# Patient Record
Sex: Female | Born: 1935 | Race: White | Hispanic: No | Marital: Married | Smoking: Never smoker
Health system: Southern US, Community
[De-identification: ages and names within clinical notes are randomized; demographics above are authoritative.]

## PROBLEM LIST (undated history)

## (undated) DIAGNOSIS — C50911 Malignant neoplasm of unspecified site of right female breast: Principal | ICD-10-CM

## (undated) DIAGNOSIS — C801 Malignant (primary) neoplasm, unspecified: Secondary | ICD-10-CM

## (undated) DIAGNOSIS — E119 Type 2 diabetes mellitus without complications: Secondary | ICD-10-CM

## (undated) DIAGNOSIS — L02412 Cutaneous abscess of left axilla: Secondary | ICD-10-CM

## (undated) DIAGNOSIS — I10 Essential (primary) hypertension: Secondary | ICD-10-CM

## (undated) DIAGNOSIS — Z923 Personal history of irradiation: Secondary | ICD-10-CM

## (undated) DIAGNOSIS — K219 Gastro-esophageal reflux disease without esophagitis: Secondary | ICD-10-CM

## (undated) DIAGNOSIS — N321 Vesicointestinal fistula: Secondary | ICD-10-CM

## (undated) DIAGNOSIS — I739 Peripheral vascular disease, unspecified: Secondary | ICD-10-CM

## (undated) DIAGNOSIS — D649 Anemia, unspecified: Secondary | ICD-10-CM

## (undated) DIAGNOSIS — F329 Major depressive disorder, single episode, unspecified: Secondary | ICD-10-CM

## (undated) DIAGNOSIS — F419 Anxiety disorder, unspecified: Secondary | ICD-10-CM

## (undated) DIAGNOSIS — M1990 Unspecified osteoarthritis, unspecified site: Secondary | ICD-10-CM

## (undated) DIAGNOSIS — F32A Depression, unspecified: Secondary | ICD-10-CM

## (undated) DIAGNOSIS — C50919 Malignant neoplasm of unspecified site of unspecified female breast: Secondary | ICD-10-CM

## (undated) HISTORY — DX: Anemia, unspecified: D64.9

## (undated) HISTORY — DX: Type 2 diabetes mellitus without complications: E11.9

## (undated) HISTORY — DX: Malignant neoplasm of unspecified site of right female breast: C50.911

## (undated) HISTORY — PX: TONSILLECTOMY: SUR1361

## (undated) HISTORY — PX: TUBAL LIGATION: SHX77

## (undated) HISTORY — DX: Gastro-esophageal reflux disease without esophagitis: K21.9

## (undated) HISTORY — PX: ILEOSTOMY: SHX1783

## (undated) HISTORY — PX: SALPINGOOPHORECTOMY: SHX82

## (undated) HISTORY — DX: Unspecified osteoarthritis, unspecified site: M19.90

## (undated) HISTORY — DX: Malignant (primary) neoplasm, unspecified: C80.1

---

## 1964-11-27 HISTORY — PX: OVARY SURGERY: SHX727

## 1964-11-27 HISTORY — PX: APPENDECTOMY: SHX54

## 2012-11-27 DIAGNOSIS — Z923 Personal history of irradiation: Secondary | ICD-10-CM

## 2012-11-27 DIAGNOSIS — C50919 Malignant neoplasm of unspecified site of unspecified female breast: Secondary | ICD-10-CM

## 2012-11-27 HISTORY — DX: Malignant neoplasm of unspecified site of unspecified female breast: C50.919

## 2012-11-27 HISTORY — PX: BREAST LUMPECTOMY: SHX2

## 2012-11-27 HISTORY — DX: Personal history of irradiation: Z92.3

## 2013-06-30 ENCOUNTER — Ambulatory Visit (INDEPENDENT_AMBULATORY_CARE_PROVIDER_SITE_OTHER): Payer: Self-pay | Admitting: Surgery

## 2013-07-02 ENCOUNTER — Ambulatory Visit (INDEPENDENT_AMBULATORY_CARE_PROVIDER_SITE_OTHER): Payer: Self-pay | Admitting: Surgery

## 2013-07-04 ENCOUNTER — Encounter (INDEPENDENT_AMBULATORY_CARE_PROVIDER_SITE_OTHER): Payer: Self-pay | Admitting: Surgery

## 2013-07-04 ENCOUNTER — Ambulatory Visit (INDEPENDENT_AMBULATORY_CARE_PROVIDER_SITE_OTHER): Payer: Medicare Other | Admitting: Surgery

## 2013-07-04 VITALS — BP 158/90 | HR 64 | Temp 98.3°F | Resp 14 | Ht 65.0 in | Wt 205.8 lb

## 2013-07-04 DIAGNOSIS — C50911 Malignant neoplasm of unspecified site of right female breast: Secondary | ICD-10-CM

## 2013-07-04 DIAGNOSIS — C50919 Malignant neoplasm of unspecified site of unspecified female breast: Secondary | ICD-10-CM

## 2013-07-04 HISTORY — DX: Malignant neoplasm of unspecified site of right female breast: C50.911

## 2013-07-04 NOTE — Progress Notes (Signed)
Patient ID: Michele Palmer, female   DOB: 1936/07/23, 77 y.o.   MRN: [id number]  Chief Complaint  Patient presents with  . Breast Cancer    eval rt br mass    HPI Michele Palmer is a 77 y.o. female.  She recently had a mammogram and an abnormality was found in the right breast. A needle core biopsy has apparently shown cancer. Surgical consultation has been requested. She has no definite family history of breast cancer although there is a question that her uncle had breast cancer but he has several other types of cancer so this is not at all clear. There are no women with either breast or ovarian cancer. She is not having any breast symptoms or problems.  HPI  Past Medical History  Diagnosis Date  . Anemia   . Arthritis   . Cancer   . Diabetes mellitus without complication   . GERD (gastroesophageal reflux disease)     Past Surgical History  Procedure Laterality Date  . Ovary surgery      removed ovaries  . Salpingoophorectomy    . Appendectomy  1966    Family History  Problem Relation Age of Onset  . Stroke Mother   . Stroke Father   . Cancer Sister     ovarian    Social History History  Substance Use Topics  . Smoking status: Never Smoker   . Smokeless tobacco: Never Used  . Alcohol Use: No    Allergies  Allergen Reactions  . Codeine Anxiety    Current Outpatient Prescriptions  Medication Sig Dispense Refill  . ALPRAZolam (XANAX) 0.25 MG tablet Take 0.25 mg by mouth 2 (two) times daily.      . citalopram (CELEXA) 10 MG tablet Take 10 mg by mouth daily.      . Lactobacillus (FLORAJEN ACIDOPHILUS PO) Take by mouth.      . losartan (COZAAR) 100 MG tablet Take 100 mg by mouth daily.      . ranitidine (ZANTAC) 150 MG capsule Take 150 mg by mouth 2 (two) times daily.       No current facility-administered medications for this visit.    Review of Systems Review of Systems  Constitutional: Negative for fever, chills and unexpected weight change.  HENT:  Negative for hearing loss, congestion, sore throat, trouble swallowing and voice change.   Eyes: Negative for visual disturbance.  Respiratory: Negative for cough and wheezing.   Cardiovascular: Negative for chest pain, palpitations and leg swelling.  Gastrointestinal: Negative for nausea, vomiting, abdominal pain, diarrhea, constipation, blood in stool, abdominal distention and anal bleeding.  Genitourinary: Negative for hematuria, vaginal bleeding and difficulty urinating.  Musculoskeletal: Negative for arthralgias.  Skin: Negative for rash and wound.  Neurological: Negative for seizures, syncope and headaches.  Hematological: Negative for adenopathy. Does not bruise/bleed easily.  Psychiatric/Behavioral: Negative for confusion.    Blood pressure 158/90, pulse 64, temperature 98.3 F (36.8 C), temperature source Temporal, resp. rate 14, height 5\' 5"  (1.651 m), weight 205 lb 12.8 oz (93.35 kg).  Physical Exam Physical Exam  Vitals reviewed. Constitutional: She is oriented to person, place, and time. She appears well-developed and well-nourished. No distress.  HENT:  Head: Normocephalic and atraumatic.  Mouth/Throat: Oropharynx is clear and moist.  Eyes: Conjunctivae and EOM are normal. Pupils are equal, round, and reactive to light. No scleral icterus.  Neck: Normal range of motion. Neck supple. No tracheal deviation present. No thyromegaly present.  Cardiovascular: Normal rate, regular rhythm, normal  heart sounds and intact distal pulses.  Exam reveals no gallop and no friction rub.   No murmur heard. Pulmonary/Chest: Effort normal and breath sounds normal. No respiratory distress. She has no wheezes. She has no rales. Right breast exhibits no inverted nipple, no mass, no nipple discharge, no skin change and no tenderness. Left breast exhibits no inverted nipple, no mass, no nipple discharge, no skin change and no tenderness. Breasts are symmetrical.  Abdominal: Soft. Bowel sounds are  normal. She exhibits no distension and no mass. There is no tenderness. There is no rebound and no guarding.  Musculoskeletal: Normal range of motion. She exhibits no edema and no tenderness.  Lymphadenopathy:    She has no cervical adenopathy.    She has no axillary adenopathy.       Right: No supraclavicular adenopathy present.       Left: No supraclavicular adenopathy present.  Neurological: She is alert and oriented to person, place, and time.  Skin: Skin is warm and dry. No rash noted. She is not diaphoretic. No erythema.  Psychiatric: She has a normal mood and affect. Her behavior is normal. Judgment and thought content normal.    Data Reviewed I reviewed the mammogram and pathology reports. Pathology shows invasive lobular carcinoma receptor positive.  Assessment    Clinical stage I right breast cancer 6:00 position     Plan    I have recommended that we perform more localized lumpectomy with sln.discuss her case at the breast conference this week to confirm.However, since this is lobular, will need to get an MRI to be sure we are not missing a second lesion or that the tumor is bigger than we think.  I have explained the pathophysiology and staging of breast cancer with particular attention to her exact situation. We discussed the multidisciplinary approach to breast cancer which often includes both medical and radiation oncology consultations.  We also discussed surgical options for the treatment of breast cancer including lumpectomy and mastectomy with possible reconstructive surgery. In addition we talked about the evaluation and management of lymph nodes including a description of sentinel lymph node biopsy and axillary dissections. We reviewed potential complications and risks including bleeding, infection, numbness,  lymphedema, and the potential need for additional surgery.  She understands that for patients who are candidate for lumpectomy or mastectomy there is an equal  survival rate with either technique, but a slightly higher local recurrence rate with lumpectomy. In addition she knows that a lumpectomy usually requires postoperative radiation as part of the management of the breast cancer.  We have discussed the likely postoperative course and plans for followup.  I have given the patient some written information that reviewed all of these issues. I believe her questions are answered and that she has a good understanding of the issues. I have discussed the indications for the lumpectomy and described the procedure. She understand that the chance of removal of the abnormal area is very good, but that occasionally we are unable to locate it and may have to do a second procedure. We also discussed the possibility of a second procedure to get additional tissue. Risks of surgery such as bleeding and infection have also been explained, as well as the implications of not doing the surgery. She understands and wishes to proceed.         Kami Kube J 07/04/2013, 11:12 AM

## 2013-07-04 NOTE — Patient Instructions (Signed)
We will schedule breast MRI, and once that is done and make final plans for surgery.

## 2013-07-07 ENCOUNTER — Other Ambulatory Visit (INDEPENDENT_AMBULATORY_CARE_PROVIDER_SITE_OTHER): Payer: Self-pay | Admitting: Surgery

## 2013-07-07 ENCOUNTER — Telehealth (INDEPENDENT_AMBULATORY_CARE_PROVIDER_SITE_OTHER): Payer: Self-pay | Admitting: General Surgery

## 2013-07-07 DIAGNOSIS — C50911 Malignant neoplasm of unspecified site of right female breast: Secondary | ICD-10-CM

## 2013-07-07 NOTE — Telephone Encounter (Signed)
Spoke with pt to inform her that it may be a couple of days before I can get her scheduled for her MRI because I am still waiting on the reports to be faxed to [place] from [facility] in [place].  Informed her that I would call her as soon as I have heard something.

## 2013-07-10 ENCOUNTER — Other Ambulatory Visit (INDEPENDENT_AMBULATORY_CARE_PROVIDER_SITE_OTHER): Payer: Self-pay | Admitting: Surgery

## 2013-07-11 ENCOUNTER — Encounter (INDEPENDENT_AMBULATORY_CARE_PROVIDER_SITE_OTHER): Payer: Self-pay

## 2013-07-14 ENCOUNTER — Ambulatory Visit
Admission: RE | Admit: 2013-07-14 | Discharge: 2013-07-14 | Disposition: A | Discharge status: Home / Self Care | Payer: Medicare Other | Source: Ambulatory Visit | Attending: Surgery | Admitting: Surgery

## 2013-07-14 DIAGNOSIS — C50911 Malignant neoplasm of unspecified site of right female breast: Secondary | ICD-10-CM

## 2013-07-14 MED ORDER — GADOBENATE DIMEGLUMINE 529 MG/ML IV SOLN
19.0000 mL | Freq: Once | INTRAVENOUS | Status: AC | PRN
  Administered 2013-07-14: 19 mL via INTRAVENOUS

## 2013-07-18 ENCOUNTER — Other Ambulatory Visit (INDEPENDENT_AMBULATORY_CARE_PROVIDER_SITE_OTHER): Payer: Self-pay | Admitting: Surgery

## 2013-07-18 ENCOUNTER — Telehealth (INDEPENDENT_AMBULATORY_CARE_PROVIDER_SITE_OTHER): Payer: Self-pay | Admitting: Surgery

## 2013-07-18 DIAGNOSIS — C50911 Malignant neoplasm of unspecified site of right female breast: Secondary | ICD-10-CM

## 2013-07-18 NOTE — Telephone Encounter (Signed)
I called her today and reviewed plans. The MRI shows only the single cancer and she was presented at the Breast Conference and consensus was for lumpectomy and sln. Told her we would go ahead and make those arrangements.

## 2013-07-22 ENCOUNTER — Encounter (HOSPITAL_BASED_OUTPATIENT_CLINIC_OR_DEPARTMENT_OTHER): Payer: Self-pay | Admitting: *Deleted

## 2013-07-22 NOTE — Progress Notes (Signed)
Bring all medications. Plans to come Tuesday for CMET, CBC,Diff, U/A, EKG and CXR.

## 2013-07-29 ENCOUNTER — Other Ambulatory Visit: Payer: Self-pay

## 2013-07-29 ENCOUNTER — Ambulatory Visit
Admission: RE | Admit: 2013-07-29 | Discharge: 2013-07-29 | Disposition: A | Discharge status: Home / Self Care | Payer: Medicare Other | Source: Ambulatory Visit | Attending: Surgery | Admitting: Surgery

## 2013-07-29 ENCOUNTER — Encounter (HOSPITAL_BASED_OUTPATIENT_CLINIC_OR_DEPARTMENT_OTHER)
Admission: RE | Admit: 2013-07-29 | Discharge: 2013-07-29 | Disposition: A | Discharge status: Home / Self Care | Payer: Medicare Other | Source: Ambulatory Visit | Attending: Surgery | Admitting: Surgery

## 2013-07-29 LAB — CBC WITH DIFFERENTIAL/PLATELET
Basophils Relative: 1 % (ref 0–1)
HCT: 41 % (ref 36.0–46.0)
Hemoglobin: 13.9 g/dL (ref 12.0–15.0)
MCH: 31.2 pg (ref 26.0–34.0)
MCHC: 33.9 g/dL (ref 30.0–36.0)
Monocytes Absolute: 0.7 10*3/uL (ref 0.1–1.0)
Monocytes Relative: 9 % (ref 3–12)
Neutro Abs: 4.9 10*3/uL (ref 1.7–7.7)

## 2013-07-29 LAB — COMPREHENSIVE METABOLIC PANEL
Albumin: 3.7 g/dL (ref 3.5–5.2)
BUN: 30 mg/dL — ABNORMAL HIGH (ref 6–23)
Chloride: 103 mEq/L (ref 96–112)
Creatinine, Ser: 0.69 mg/dL (ref 0.50–1.10)
GFR calc Af Amer: >90 mL/min (ref >90)
GFR calc non Af Amer: 83 mL/min — ABNORMAL LOW (ref >90)
Total Bilirubin: 0.2 mg/dL — ABNORMAL LOW (ref 0.3–1.2)

## 2013-07-29 LAB — URINE MICROSCOPIC-ADD ON

## 2013-07-29 LAB — URINALYSIS, ROUTINE W REFLEX MICROSCOPIC
Glucose, UA: NEGATIVE mg/dL
Nitrite: NEGATIVE
Protein, ur: NEGATIVE mg/dL
Urobilinogen, UA: 0.2 mg/dL (ref 0.0–1.0)

## 2013-08-01 ENCOUNTER — Encounter (HOSPITAL_BASED_OUTPATIENT_CLINIC_OR_DEPARTMENT_OTHER): Payer: Self-pay

## 2013-08-01 ENCOUNTER — Ambulatory Visit (HOSPITAL_BASED_OUTPATIENT_CLINIC_OR_DEPARTMENT_OTHER): Payer: Medicare Other | Admitting: Anesthesiology

## 2013-08-01 ENCOUNTER — Encounter (HOSPITAL_BASED_OUTPATIENT_CLINIC_OR_DEPARTMENT_OTHER): Payer: Self-pay | Admitting: Anesthesiology

## 2013-08-01 ENCOUNTER — Ambulatory Visit (HOSPITAL_BASED_OUTPATIENT_CLINIC_OR_DEPARTMENT_OTHER)
Admission: RE | Admit: 2013-08-01 | Discharge: 2013-08-01 | Disposition: A | Discharge status: Home / Self Care | Payer: Medicare Other | Source: Ambulatory Visit | Attending: Surgery | Admitting: Surgery

## 2013-08-01 ENCOUNTER — Ambulatory Visit (HOSPITAL_COMMUNITY)
Admission: RE | Admit: 2013-08-01 | Discharge: 2013-08-01 | Disposition: A | Discharge status: Home / Self Care | Payer: Medicare Other | Source: Ambulatory Visit | Attending: Surgery | Admitting: Surgery

## 2013-08-01 ENCOUNTER — Encounter (HOSPITAL_BASED_OUTPATIENT_CLINIC_OR_DEPARTMENT_OTHER)
Admission: RE | Disposition: A | Discharge status: Home / Self Care | Payer: Self-pay | Source: Ambulatory Visit | Attending: Surgery

## 2013-08-01 ENCOUNTER — Ambulatory Visit
Admission: RE | Admit: 2013-08-01 | Discharge: 2013-08-01 | Disposition: A | Discharge status: Home / Self Care | Payer: Medicare Other | Source: Ambulatory Visit | Attending: Surgery | Admitting: Surgery

## 2013-08-01 DIAGNOSIS — D4860 Neoplasm of uncertain behavior of unspecified breast: Secondary | ICD-10-CM

## 2013-08-01 DIAGNOSIS — C50911 Malignant neoplasm of unspecified site of right female breast: Secondary | ICD-10-CM

## 2013-08-01 DIAGNOSIS — Z885 Allergy status to narcotic agent status: Secondary | ICD-10-CM | POA: Insufficient documentation

## 2013-08-01 DIAGNOSIS — Z79899 Other long term (current) drug therapy: Secondary | ICD-10-CM | POA: Insufficient documentation

## 2013-08-01 DIAGNOSIS — D649 Anemia, unspecified: Secondary | ICD-10-CM | POA: Insufficient documentation

## 2013-08-01 DIAGNOSIS — Z8041 Family history of malignant neoplasm of ovary: Secondary | ICD-10-CM | POA: Insufficient documentation

## 2013-08-01 DIAGNOSIS — E119 Type 2 diabetes mellitus without complications: Secondary | ICD-10-CM | POA: Insufficient documentation

## 2013-08-01 DIAGNOSIS — C50919 Malignant neoplasm of unspecified site of unspecified female breast: Secondary | ICD-10-CM | POA: Insufficient documentation

## 2013-08-01 DIAGNOSIS — K219 Gastro-esophageal reflux disease without esophagitis: Secondary | ICD-10-CM | POA: Insufficient documentation

## 2013-08-01 DIAGNOSIS — M129 Arthropathy, unspecified: Secondary | ICD-10-CM | POA: Insufficient documentation

## 2013-08-01 HISTORY — DX: Major depressive disorder, single episode, unspecified: F32.9

## 2013-08-01 HISTORY — PX: BREAST LUMPECTOMY WITH NEEDLE LOCALIZATION AND AXILLARY SENTINEL LYMPH NODE BX: SHX5760

## 2013-08-01 HISTORY — DX: Anxiety disorder, unspecified: F41.9

## 2013-08-01 HISTORY — DX: Depression, unspecified: F32.A

## 2013-08-01 SURGERY — BREAST LUMPECTOMY WITH NEEDLE LOCALIZATION AND AXILLARY SENTINEL LYMPH NODE BX
Anesthesia: General | Laterality: Right | Wound class: Clean

## 2013-08-01 MED ORDER — DEXAMETHASONE SODIUM PHOSPHATE 4 MG/ML IJ SOLN
INTRAMUSCULAR | Status: DC | PRN
  Administered 2013-08-01: 10 mg via INTRAVENOUS

## 2013-08-01 MED ORDER — CHLORHEXIDINE GLUCONATE 4 % EX LIQD: 1.0000 "application " | Freq: Once | CUTANEOUS | Status: DC

## 2013-08-01 MED ORDER — KETOROLAC TROMETHAMINE 30 MG/ML IJ SOLN: 15.0000 mg | Freq: Once | INTRAMUSCULAR | Status: DC | PRN

## 2013-08-01 MED ORDER — FENTANYL CITRATE 0.05 MG/ML IJ SOLN
INTRAMUSCULAR | Status: DC | PRN
  Administered 2013-08-01: 25 ug via INTRAVENOUS
  Administered 2013-08-01: 100 ug via INTRAVENOUS

## 2013-08-01 MED ORDER — BUPIVACAINE HCL (PF) 0.25 % IJ SOLN
INTRAMUSCULAR | Status: DC | PRN
  Administered 2013-08-01: 30 mL

## 2013-08-01 MED ORDER — TECHNETIUM TC 99M SULFUR COLLOID FILTERED
1.0000 | Freq: Once | INTRAVENOUS | Status: AC | PRN
  Administered 2013-08-01: 1 via INTRADERMAL

## 2013-08-01 MED ORDER — CEFAZOLIN SODIUM-DEXTROSE 2-3 GM-% IV SOLR
2.0000 g | INTRAVENOUS | Status: AC
  Administered 2013-08-01: 2 g via INTRAVENOUS

## 2013-08-01 MED ORDER — LIDOCAINE HCL (CARDIAC) 20 MG/ML IV SOLN
INTRAVENOUS | Status: DC | PRN
  Administered 2013-08-01: 50 mg via INTRAVENOUS

## 2013-08-01 MED ORDER — EPHEDRINE SULFATE 50 MG/ML IJ SOLN
INTRAMUSCULAR | Status: DC | PRN
  Administered 2013-08-01: 10 mg via INTRAVENOUS

## 2013-08-01 MED ORDER — MIDAZOLAM HCL 2 MG/2ML IJ SOLN
1.0000 mg | INTRAMUSCULAR | Status: DC | PRN
  Administered 2013-08-01: 1 mg via INTRAVENOUS

## 2013-08-01 MED ORDER — PROPOFOL 10 MG/ML IV BOLUS
INTRAVENOUS | Status: DC | PRN
  Administered 2013-08-01: 250 mg via INTRAVENOUS

## 2013-08-01 MED ORDER — OXYCODONE HCL 5 MG/5ML PO SOLN: 5.0000 mg | Freq: Once | ORAL | Status: DC | PRN

## 2013-08-01 MED ORDER — ONDANSETRON HCL 4 MG/2ML IJ SOLN: 4.0000 mg | Freq: Once | INTRAMUSCULAR | Status: DC | PRN

## 2013-08-01 MED ORDER — LACTATED RINGERS IV SOLN
INTRAVENOUS | Status: DC
  Administered 2013-08-01 (×2): via INTRAVENOUS

## 2013-08-01 MED ORDER — OXYCODONE HCL 5 MG PO TABS: 5.0000 mg | ORAL_TABLET | Freq: Once | ORAL | Status: DC | PRN

## 2013-08-01 MED ORDER — SODIUM CHLORIDE 0.9 % IJ SOLN
INTRAMUSCULAR | Status: DC | PRN
  Administered 2013-08-01: 14:00:00

## 2013-08-01 MED ORDER — TRAMADOL HCL 50 MG PO TABS: 50.0000 mg | ORAL_TABLET | Freq: Four times a day (QID) | ORAL | Status: DC | PRN

## 2013-08-01 MED ORDER — FENTANYL CITRATE 0.05 MG/ML IJ SOLN
50.0000 ug | INTRAMUSCULAR | Status: DC | PRN
Start: 2013-08-01 — End: 2013-08-01
  Administered 2013-08-01: 50 ug via INTRAVENOUS

## 2013-08-01 MED ORDER — HYDROMORPHONE HCL PF 1 MG/ML IJ SOLN
0.2500 mg | INTRAMUSCULAR | Status: DC | PRN
  Administered 2013-08-01 (×2): 0.25 mg via INTRAVENOUS

## 2013-08-01 SURGICAL SUPPLY — 61 items
APPLIER CLIP 11 MED OPEN (CLIP)
APPLIER CLIP 9.375 MED OPEN (MISCELLANEOUS) ×2
BINDER BREAST XLRG (GAUZE/BANDAGES/DRESSINGS) ×2 IMPLANT
BLADE HEX COATED 2.75 (ELECTRODE) ×2 IMPLANT
BLADE SURG 15 STRL LF DISP TIS (BLADE) ×2 IMPLANT
BLADE SURG 15 STRL SS (BLADE) ×2
CANISTER SUCTION 1200CC (MISCELLANEOUS) ×2 IMPLANT
CHLORAPREP W/TINT 26ML (MISCELLANEOUS) ×2 IMPLANT
CLIP APPLIE 11 MED OPEN (CLIP) IMPLANT
CLIP APPLIE 9.375 MED OPEN (MISCELLANEOUS) ×1 IMPLANT
CLIP TI MEDIUM 6 (CLIP) IMPLANT
CLIP TI WIDE RED SMALL 6 (CLIP) ×2 IMPLANT
CLOTH BEACON ORANGE TIMEOUT ST (SAFETY) ×2 IMPLANT
COVER MAYO STAND STRL (DRAPES) ×2 IMPLANT
COVER PROBE 5X48 (MISCELLANEOUS)
COVER PROBE W GEL 5X96 (DRAPES) ×2 IMPLANT
COVER TABLE BACK 60X90 (DRAPES) ×2 IMPLANT
DECANTER SPIKE VIAL GLASS SM (MISCELLANEOUS) IMPLANT
DERMABOND ADVANCED (GAUZE/BANDAGES/DRESSINGS) ×2
DERMABOND ADVANCED .7 DNX12 (GAUZE/BANDAGES/DRESSINGS) ×2 IMPLANT
DEVICE DUBIN W/COMP PLATE 8390 (MISCELLANEOUS) IMPLANT
DRAIN CHANNEL 19F RND (DRAIN) IMPLANT
DRAPE LAPAROSCOPIC ABDOMINAL (DRAPES) ×2 IMPLANT
DRAPE SURG 17X23 STRL (DRAPES) IMPLANT
DRAPE UTILITY XL STRL (DRAPES) ×2 IMPLANT
DRSG EMULSION OIL 3X3 NADH (GAUZE/BANDAGES/DRESSINGS) IMPLANT
ELECT BLADE 4.0 EZ CLEAN MEGAD (MISCELLANEOUS)
ELECT REM PT RETURN 9FT ADLT (ELECTROSURGICAL) ×2
ELECTRODE BLDE 4.0 EZ CLN MEGD (MISCELLANEOUS) IMPLANT
ELECTRODE REM PT RTRN 9FT ADLT (ELECTROSURGICAL) ×1 IMPLANT
EVACUATOR SILICONE 100CC (DRAIN) IMPLANT
GLOVE BIO SURGEON STRL SZ7 (GLOVE) ×2 IMPLANT
GLOVE EUDERMIC 7 POWDERFREE (GLOVE) ×2 IMPLANT
GLOVE EXAM NITRILE MD LF STRL (GLOVE) ×2 IMPLANT
GOWN PREVENTION PLUS XLARGE (GOWN DISPOSABLE) ×2 IMPLANT
GOWN PREVENTION PLUS XXLARGE (GOWN DISPOSABLE) ×2 IMPLANT
KIT CVR 48X5XPRB PLUP LF (MISCELLANEOUS) IMPLANT
KIT MARKER MARGIN INK (KITS) ×2 IMPLANT
NDL SAFETY ECLIPSE 18X1.5 (NEEDLE) ×1 IMPLANT
NEEDLE HYPO 18GX1.5 SHARP (NEEDLE) ×1
NEEDLE HYPO 25X1 1.5 SAFETY (NEEDLE) ×4 IMPLANT
NS IRRIG 1000ML POUR BTL (IV SOLUTION) ×2 IMPLANT
PACK BASIN DAY SURGERY FS (CUSTOM PROCEDURE TRAY) ×2 IMPLANT
PENCIL BUTTON HOLSTER BLD 10FT (ELECTRODE) ×2 IMPLANT
PIN SAFETY STERILE (MISCELLANEOUS) IMPLANT
SHEET MEDIUM DRAPE 40X70 STRL (DRAPES) ×2 IMPLANT
SLEEVE SCD COMPRESS KNEE MED (MISCELLANEOUS) ×2 IMPLANT
SPONGE GAUZE 4X4 12PLY (GAUZE/BANDAGES/DRESSINGS) IMPLANT
SPONGE INTESTINAL PEANUT (DISPOSABLE) IMPLANT
SPONGE LAP 18X18 X RAY DECT (DISPOSABLE) IMPLANT
SPONGE LAP 4X18 X RAY DECT (DISPOSABLE) ×2 IMPLANT
SUT ETHILON 2 0 FS 18 (SUTURE) IMPLANT
SUT ETHILON 3 0 FSL (SUTURE) IMPLANT
SUT MNCRL AB 4-0 PS2 18 (SUTURE) ×4 IMPLANT
SUT VIC AB 4-0 BRD 54 (SUTURE) IMPLANT
SUT VICRYL 3-0 CR8 SH (SUTURE) ×4 IMPLANT
SYR CONTROL 10ML LL (SYRINGE) ×4 IMPLANT
TOWEL OR 17X24 6PK STRL BLUE (TOWEL DISPOSABLE) ×2 IMPLANT
TOWEL OR NON WOVEN STRL DISP B (DISPOSABLE) ×2 IMPLANT
TUBE CONNECTING 20X1/4 (TUBING) ×2 IMPLANT
YANKAUER SUCT BULB TIP NO VENT (SUCTIONS) ×2 IMPLANT

## 2013-08-01 NOTE — Transfer of Care (Signed)
Immediate Anesthesia Transfer of Care Note  Patient: Michele Palmer  Procedure(s) Performed: Procedure(s): BREAST LUMPECTOMY WITH NEEDLE LOCALIZATION AND AXILLARY SENTINEL LYMPH NODE BIOPSY (Right)  Patient Location: PACU  Anesthesia Type:General  Level of Consciousness: awake, alert  and oriented  Airway & Oxygen Therapy: Patient Spontanous Breathing and Patient connected to face mask oxygen  Post-op Assessment: Report given to PACU RN and Post -op Vital signs reviewed and stable  Post vital signs: Reviewed and stable  Complications: No apparent anesthesia complications

## 2013-08-01 NOTE — Progress Notes (Signed)
Emotional support during breast injections °

## 2013-08-01 NOTE — Anesthesia Preprocedure Evaluation (Signed)
Anesthesia Evaluation  Patient identified by MRN, date of birth, ID band Patient awake    Reviewed: Allergy & Precautions, H&P , NPO status , Patient's Chart, lab work & pertinent test results  Airway Mallampati: II      Dental  (+) Partial Upper and Dental Advisory Given   Pulmonary  breath sounds clear to auscultation        Cardiovascular Rhythm:Regular Rate:Normal     Neuro/Psych    GI/Hepatic   Endo/Other    Renal/GU      Musculoskeletal   Abdominal   Peds  Hematology   Anesthesia Other Findings   Reproductive/Obstetrics                           Anesthesia Physical Anesthesia Plan  ASA: II  Anesthesia Plan: General   Post-op Pain Management:    Induction: Intravenous  Airway Management Planned: LMA  Additional Equipment:   Intra-op Plan:   Post-operative Plan:   Informed Consent: I have reviewed the patients History and Physical, chart, labs and discussed the procedure including the risks, benefits and alternatives for the proposed anesthesia with the patient or authorized representative who has indicated his/her understanding and acceptance.   Dental advisory given  Plan Discussed with: CRNA and Anesthesiologist  Anesthesia Plan Comments:         Anesthesia Quick Evaluation

## 2013-08-01 NOTE — H&P (View-Only) (Signed)
Patient ID: Michele Palmer, female   DOB: 02/23/1936, 77 y.o.   MRN: [id number]  Chief Complaint  Patient presents with  . Breast Cancer    eval rt br mass    HPI Adalaide S Gunkel is a 77 y.o. female.  She recently had a mammogram and an abnormality was found in the right breast. A needle core biopsy has apparently shown cancer. Surgical consultation has been requested. She has no definite family history of breast cancer although there is a question that her uncle had breast cancer but he has several other types of cancer so this is not at all clear. There are no women with either breast or ovarian cancer. She is not having any breast symptoms or problems.  HPI  Past Medical History  Diagnosis Date  . Anemia   . Arthritis   . Cancer   . Diabetes mellitus without complication   . GERD (gastroesophageal reflux disease)     Past Surgical History  Procedure Laterality Date  . Ovary surgery      removed ovaries  . Salpingoophorectomy    . Appendectomy  1966    Family History  Problem Relation Age of Onset  . Stroke Mother   . Stroke Father   . Cancer Sister     ovarian    Social History History  Substance Use Topics  . Smoking status: Never Smoker   . Smokeless tobacco: Never Used  . Alcohol Use: No    Allergies  Allergen Reactions  . Codeine Anxiety    Current Outpatient Prescriptions  Medication Sig Dispense Refill  . ALPRAZolam (XANAX) 0.25 MG tablet Take 0.25 mg by mouth 2 (two) times daily.      . citalopram (CELEXA) 10 MG tablet Take 10 mg by mouth daily.      . Lactobacillus (FLORAJEN ACIDOPHILUS PO) Take by mouth.      . losartan (COZAAR) 100 MG tablet Take 100 mg by mouth daily.      . ranitidine (ZANTAC) 150 MG capsule Take 150 mg by mouth 2 (two) times daily.       No current facility-administered medications for this visit.    Review of Systems Review of Systems  Constitutional: Negative for fever, chills and unexpected weight change.  HENT:  Negative for hearing loss, congestion, sore throat, trouble swallowing and voice change.   Eyes: Negative for visual disturbance.  Respiratory: Negative for cough and wheezing.   Cardiovascular: Negative for chest pain, palpitations and leg swelling.  Gastrointestinal: Negative for nausea, vomiting, abdominal pain, diarrhea, constipation, blood in stool, abdominal distention and anal bleeding.  Genitourinary: Negative for hematuria, vaginal bleeding and difficulty urinating.  Musculoskeletal: Negative for arthralgias.  Skin: Negative for rash and wound.  Neurological: Negative for seizures, syncope and headaches.  Hematological: Negative for adenopathy. Does not bruise/bleed easily.  Psychiatric/Behavioral: Negative for confusion.    Blood pressure 158/90, pulse 64, temperature 98.3 F (36.8 C), temperature source Temporal, resp. rate 14, height 5' 5" (1.651 m), weight 205 lb 12.8 oz (93.35 kg).  Physical Exam Physical Exam  Vitals reviewed. Constitutional: She is oriented to person, place, and time. She appears well-developed and well-nourished. No distress.  HENT:  Head: Normocephalic and atraumatic.  Mouth/Throat: Oropharynx is clear and moist.  Eyes: Conjunctivae and EOM are normal. Pupils are equal, round, and reactive to light. No scleral icterus.  Neck: Normal range of motion. Neck supple. No tracheal deviation present. No thyromegaly present.  Cardiovascular: Normal rate, regular rhythm, normal   heart sounds and intact distal pulses.  Exam reveals no gallop and no friction rub.   No murmur heard. Pulmonary/Chest: Effort normal and breath sounds normal. No respiratory distress. She has no wheezes. She has no rales. Right breast exhibits no inverted nipple, no mass, no nipple discharge, no skin change and no tenderness. Left breast exhibits no inverted nipple, no mass, no nipple discharge, no skin change and no tenderness. Breasts are symmetrical.  Abdominal: Soft. Bowel sounds are  normal. She exhibits no distension and no mass. There is no tenderness. There is no rebound and no guarding.  Musculoskeletal: Normal range of motion. She exhibits no edema and no tenderness.  Lymphadenopathy:    She has no cervical adenopathy.    She has no axillary adenopathy.       Right: No supraclavicular adenopathy present.       Left: No supraclavicular adenopathy present.  Neurological: She is alert and oriented to person, place, and time.  Skin: Skin is warm and dry. No rash noted. She is not diaphoretic. No erythema.  Psychiatric: She has a normal mood and affect. Her behavior is normal. Judgment and thought content normal.    Data Reviewed I reviewed the mammogram and pathology reports. Pathology shows invasive lobular carcinoma receptor positive.  Assessment    Clinical stage I right breast cancer 6:00 position     Plan    I have recommended that we perform more localized lumpectomy with sln.discuss her case at the breast conference this week to confirm.However, since this is lobular, will need to get an MRI to be sure we are not missing a second lesion or that the tumor is bigger than we think.  I have explained the pathophysiology and staging of breast cancer with particular attention to her exact situation. We discussed the multidisciplinary approach to breast cancer which often includes both medical and radiation oncology consultations.  We also discussed surgical options for the treatment of breast cancer including lumpectomy and mastectomy with possible reconstructive surgery. In addition we talked about the evaluation and management of lymph nodes including a description of sentinel lymph node biopsy and axillary dissections. We reviewed potential complications and risks including bleeding, infection, numbness,  lymphedema, and the potential need for additional surgery.  She understands that for patients who are candidate for lumpectomy or mastectomy there is an equal  survival rate with either technique, but a slightly higher local recurrence rate with lumpectomy. In addition she knows that a lumpectomy usually requires postoperative radiation as part of the management of the breast cancer.  We have discussed the likely postoperative course and plans for followup.  I have given the patient some written information that reviewed all of these issues. I believe her questions are answered and that she has a good understanding of the issues. I have discussed the indications for the lumpectomy and described the procedure. She understand that the chance of removal of the abnormal area is very good, but that occasionally we are unable to locate it and may have to do a second procedure. We also discussed the possibility of a second procedure to get additional tissue. Risks of surgery such as bleeding and infection have also been explained, as well as the implications of not doing the surgery. She understands and wishes to proceed.         Aracelia Brinson J 07/04/2013, 11:12 AM    

## 2013-08-01 NOTE — Anesthesia Postprocedure Evaluation (Signed)
  Anesthesia Post-op Note  Patient: Michele Palmer  Procedure(s) Performed: Procedure(s): BREAST LUMPECTOMY WITH NEEDLE LOCALIZATION AND AXILLARY SENTINEL LYMPH NODE BIOPSY (Right)  Patient Location: PACU  Anesthesia Type:General  Level of Consciousness: awake, alert  and oriented  Airway and Oxygen Therapy: Patient Spontanous Breathing  Post-op Pain: mild  Post-op Assessment: Post-op Vital signs reviewed, Patient's Cardiovascular Status Stable, Respiratory Function Stable, Patent Airway and Pain level controlled  Post-op Vital Signs: stable  Complications: No apparent anesthesia complications

## 2013-08-01 NOTE — Interval H&P Note (Signed)
History and Physical Interval Note:  08/01/2013 1:06 PM  Michele Palmer  has presented today for surgery, with the diagnosis of right breast cancer  The various methods of treatment have been discussed with the patient and family. After consideration of risks, benefits and other options for treatment, the patient has consented to  Procedure(s): BREAST LUMPECTOMY WITH NEEDLE LOCALIZATION AND AXILLARY SENTINEL LYMPH NODE BX (Right) as a surgical intervention .  The patient's history has been reviewed, patient examined, no change in status, stable for surgery.  I have reviewed the patient's chart and labs.  Questions were answered to the patient's satisfaction.    The right breast is marked at the operative site and the films are reviewed.  Javar Eshbach J

## 2013-08-01 NOTE — Anesthesia Procedure Notes (Signed)
Procedure Name: LMA Insertion Date/Time: 08/01/2013 1:19 PM Performed by: Zenia Resides D Pre-anesthesia Checklist: Patient identified, Emergency Drugs available, Suction available and Patient being monitored Patient Re-evaluated:Patient Re-evaluated prior to inductionOxygen Delivery Method: Circle System Utilized Preoxygenation: Pre-oxygenation with 100% oxygen Intubation Type: IV induction Ventilation: Mask ventilation without difficulty LMA: LMA inserted LMA Size: 4.0 Number of attempts: 1 Airway Equipment and Method: bite block Placement Confirmation: positive ETCO2 Tube secured with: Tape Dental Injury: Teeth and Oropharynx as per pre-operative assessment

## 2013-08-01 NOTE — Op Note (Signed)
Michele Palmer 01-23-1936 [id number] 07/21/2013  Preoperative diagnosis: Right breast cancer, clinical stage 1  Postoperative diagnosis: Same  Procedure: Right wire localized lumpectomy with blue dye injection and sentinel node dissection  Surgeon: Currie Paris, MD, FACS  Anesthesia: General   Clinical History and Indications: This patient was recently diagnosed with a right breast cancer  In the lower part of the breast. After review of the alternatives she elected lumpectomy and sentinel node dissection.  Description of Procedure: the patient was seen in the preoperative area, the plans reviewed, questions answered, the right breast marked as the opposite side, and the wire localizing films reviewed. The patient was then taken to the operating room where satisfactory general anesthesia was obtained  A timeout was performed. The right breast was injected with 5 cc of dilute methylene blue which was thoroughly massaged it. A full prep and drape and a second timeout was then done.  The guidewire entered near the inframammary fold laterally and appear to track towards the nipple areolar complex in an oblique fashion. I therefore made a radial incision at the 6:00 position which I thought would be centered over the tumor going from the areola towards the inframammary fold. I raised skin flap laterally and was able to manipulate the guidewire into the wound and then took some Allis clamps for traction onto the tissue and excised all the tissue around the guidewire almost to the chest wall and will be on the tip. I could palpate the tumor mass close to the inferior and medial border. Specimen mammogram showed the lesion in the specimen.  I took additional tissue deep to make sure I had a complete deep margin to the fascia. I also took some additional medial inferior tissue to be sure I had gotten a this area is clear as possible. The breast is primary fatty making the patient fairly flimsy.  At this point I then injected 20 cc of 0.25% plain Marcaine. I are getting make sure everything was dry. I put clips in on the marginal walls for postoperative radiation therapy planning. I closed this in 3-0 Vicryl and 4-0 Monocryl subcuticular running. I could not closed the entire cavity as this would produce significant deformity so left a seroma cavity 2 form later.  Using the neoprobe identified a hot area in the right axilla. I made a transverse incision placed a self-retaining retractor. Small blood vessels were cauterized or clipped. We entered the axilla proper the neoprobe directed to the towards a lymph node which was able to locate and removed. It has a blue dye coming into it and counts of about 950. The neoprobe identified secondary and further dissection revealed a second lymph node with counts of 300 and slight amount of the dye. Having removed that I found no more radioactive counts in the axilla, felt no abnormal lymph nodes, and saw no other blue lymph nodes. I then injected 10 cc of 0.25% plain Marcaine and closed in layers with 3-0 Vicryl and a 4-0 Monocryl subcuticular plus Dermabond on the skin  The patient tolerated the procedure well. There are no complications. Counts were correct. Blood loss was minimal.  Currie Paris, MD, FACS 08/01/2013 2:36 PM

## 2013-08-04 ENCOUNTER — Encounter (HOSPITAL_BASED_OUTPATIENT_CLINIC_OR_DEPARTMENT_OTHER): Payer: Self-pay | Admitting: Surgery

## 2013-08-05 NOTE — Progress Notes (Signed)
Quick Note:  Tell the patient that her margins are OK and her lymph nodes are negative. I will discuss in detail in the office. ______

## 2013-08-07 ENCOUNTER — Telehealth (INDEPENDENT_AMBULATORY_CARE_PROVIDER_SITE_OTHER): Payer: Self-pay | Admitting: General Surgery

## 2013-08-07 NOTE — Telephone Encounter (Signed)
Patient called today and wanted to know what her path result is please call patient on her cell number

## 2013-08-08 ENCOUNTER — Telehealth (INDEPENDENT_AMBULATORY_CARE_PROVIDER_SITE_OTHER): Payer: Self-pay | Admitting: Surgery

## 2013-08-08 NOTE — Telephone Encounter (Signed)
Called her the path report. She wants to be seen in [place] for rads but will come to [facility] for initial consultation for med onc.

## 2013-08-12 ENCOUNTER — Other Ambulatory Visit (INDEPENDENT_AMBULATORY_CARE_PROVIDER_SITE_OTHER): Payer: Self-pay

## 2013-08-12 DIAGNOSIS — C50911 Malignant neoplasm of unspecified site of right female breast: Secondary | ICD-10-CM

## 2013-08-19 ENCOUNTER — Telehealth: Payer: Self-pay | Admitting: *Deleted

## 2013-08-19 ENCOUNTER — Ambulatory Visit (INDEPENDENT_AMBULATORY_CARE_PROVIDER_SITE_OTHER): Payer: Medicare Other | Admitting: Surgery

## 2013-08-19 ENCOUNTER — Encounter (INDEPENDENT_AMBULATORY_CARE_PROVIDER_SITE_OTHER): Payer: Self-pay | Admitting: Surgery

## 2013-08-19 VITALS — BP 133/81 | HR 68 | Temp 98.1°F | Resp 14 | Ht 64.0 in | Wt 210.2 lb

## 2013-08-19 DIAGNOSIS — C50919 Malignant neoplasm of unspecified site of unspecified female breast: Secondary | ICD-10-CM

## 2013-08-19 DIAGNOSIS — C50911 Malignant neoplasm of unspecified site of right female breast: Secondary | ICD-10-CM

## 2013-08-19 MED ORDER — DOXYCYCLINE HYCLATE 100 MG PO TABS: 100.0000 mg | ORAL_TABLET | Freq: Two times a day (BID) | ORAL | Status: DC

## 2013-08-19 NOTE — Telephone Encounter (Signed)
Left message for pt to return my call so I can schedule a Med Onc appt. 

## 2013-08-19 NOTE — Progress Notes (Signed)
Michele Palmer                                            DOB: Nov 06, 1936 DATE: 08/19/2013                                                  MRN: [id number]  CC:  Chief Complaint  Patient presents with  . Routine Post Op    HPI: This patient comes in for post op follow-up .Sheunderwent right lumpectomy and sln on 08/01/13 for ILC. She feels that she is doing well.Has just noticed some mild redness in the upper inner quadrant and slight warmth. NO fevers or chills  PE:  VITAL SIGNS: BP 133/81  Pulse 68  Temp(Src) 98.1 F (36.7 C) (Temporal)  Resp 14  Ht 5\' 4"  (1.626 m)  Wt 210 lb 3.2 oz (95.346 kg)  BMI 36.06 kg/m2  General: The patient appears to be healthy, NAD Breast and axillary incisions healing nicely. May be slight erythema of the breast, but looks more like some old blood staining of the skin developing late  DATA REVIEWED:Path: 1. PROGNOSTIC INDICATORS - ACIS Results: IMMUNOHISTOCHEMICAL AND MORPHOMETRIC ANALYSIS BY THE AUTOMATED CELLULAR IMAGING SYSTEM (ACIS) Estrogen Receptor: 100%, POSITIVE, STRONG STAINING INTENSITY Progesterone Receptor: 100%, POSITIVE, STRONG STAINING INTENSITY Proliferation Marker Ki67: 11% REFERENCE RANGE ESTROGEN RECEPTOR NEGATIVE <1% POSITIVE =>1% PROGESTERONE RECEPTOR NEGATIVE <1% POSITIVE =>1% All controls stained appropriately Pecola Leisure MD Pathologist, Electronic Signature ( Signed 08/08/2013) 1. CHROMOGENIC IN-SITU HYBRIDIZATION Results: HER-2/NEU BY CISH - NO AMPLIFICATION OF HER-2 DETECTED. RESULT RATIO OF HER2: CEP 17 SIGNALS 1.19 AVERAGE HER2 COPY NUMBER PER CELL 1.55 REFERENCE RANGE 1 of 4 FINAL for Michele Palmer, Michele Palmer ([id number]) ADDITIONAL INFORMATION:(continued) NEGATIVE HER2/Chr17 Ratio <2.0 and Average HER2 copy number <4.0 EQUIVOCAL HER2/Chr17 Ratio <2.0 and Average HER2 copy number 4.0 and <6.0 POSITIVE HER2/Chr17 Ratio >=2.0 and/or Average HER2 copy number >=6.0 Pecola Leisure MD Pathologist, Electronic  Signature ( Signed 08/07/2013) FINAL DIAGNOSIS Diagnosis 1. Breast, lumpectomy, Right - INVASIVE LOBULAR CARCINOMA, GRADE I/III, SPANNING 1.8 CM. - LOBULAR CARCINOMA IN SITU. - PERINEURAL INVASION IS IDENTIFIED. - INVASIVE CARCINOMA IS FOCALLY LESS THAN 0.1 CM FROM THE INFERIOR MARGIN ON SPECIMEN # 1. - SEE ONCOLOGY TABLE BELOW. 2. Breast, excision, right additional deep margin - BENIGN SOFT TISSUE. - THERE IS NO EVIDENCE OF MALIGNANCY. - SEE COMMENT. 3. Breast, excision, right additional medial and inferior margin - ATYPICAL LOBULAR HYPERPLASIA. - SEE COMMENT. 4. Lymph node, sentinel, biopsy, Right axilla #1 - THERE IS NO EVIDENCE OF CARCINOMA IN 1 OF 1 LYMPH NODE (0/1). - SEE COMMENT. 5. Lymph node, sentinel, biopsy, Right axilla #2 - THERE IS NO EVIDENCE OF CARCINOMA IN 1 OF 1 LYMPH NODE (0/1). - SEE COMMENT. Microscopic Comment  IMPRESSION: The patient is doing well S/P lumpectomy and axillary dissection. Breat redness, dont' think is infectious but cant be sure.    PLAN: Will start doxy, see in two weeks. She has rad onc appoitment and will make med onc.  I gave the patient a copy of the pathology report and reviewed it with her

## 2013-08-19 NOTE — Patient Instructions (Signed)
Start antibiotic today - should be at your pharmacy in [place]. See me in two weeks, sooner if you develop fever or increasing breast pain or redness

## 2013-08-20 ENCOUNTER — Telehealth: Payer: Self-pay | Admitting: *Deleted

## 2013-08-20 NOTE — Telephone Encounter (Signed)
Pt returned my call and wants to be seen in [place] for Med Onc.  Emailed Meagan at Dr. Tenna Child office requesting her to send them a referral.

## 2013-08-21 ENCOUNTER — Telehealth (INDEPENDENT_AMBULATORY_CARE_PROVIDER_SITE_OTHER): Payer: Self-pay | Admitting: General Surgery

## 2013-08-21 ENCOUNTER — Telehealth (INDEPENDENT_AMBULATORY_CARE_PROVIDER_SITE_OTHER): Payer: Self-pay

## 2013-08-21 NOTE — Telephone Encounter (Signed)
Crystal, at the [facility] in [place], called for additional reports for Dr. Tenna Child pt.  Printed and FAXd the pathology report and the most recent MGM to her at [phone].

## 2013-08-21 NOTE — Telephone Encounter (Signed)
Pt calling b/c she wants to see the medical oncologist in [place] but she wants to do her radiation tx's in [place]. Please make the referral for pt.

## 2013-08-25 ENCOUNTER — Other Ambulatory Visit (INDEPENDENT_AMBULATORY_CARE_PROVIDER_SITE_OTHER): Payer: Self-pay

## 2013-08-28 ENCOUNTER — Telehealth: Payer: Self-pay | Admitting: *Deleted

## 2013-08-28 NOTE — Telephone Encounter (Signed)
Confirmed 09/15/13 appt w/ pt.  Mailed before appt letter & packet to pt.  Took paperwork to Med Rec for chart.

## 2013-09-02 ENCOUNTER — Encounter (INDEPENDENT_AMBULATORY_CARE_PROVIDER_SITE_OTHER): Payer: Self-pay | Admitting: Surgery

## 2013-09-02 ENCOUNTER — Ambulatory Visit (INDEPENDENT_AMBULATORY_CARE_PROVIDER_SITE_OTHER): Payer: Medicare Other | Admitting: Surgery

## 2013-09-02 VITALS — BP 130/82 | HR 76 | Temp 97.0°F | Resp 16 | Ht 63.0 in | Wt 209.6 lb

## 2013-09-02 DIAGNOSIS — Z09 Encounter for follow-up examination after completed treatment for conditions other than malignant neoplasm: Secondary | ICD-10-CM

## 2013-09-02 NOTE — Progress Notes (Signed)
Michele Palmer                                            DOB: [birth year] DATE: 09/02/2013                                                  MRN: [id number]  CC:  Chief Complaint  Patient presents with  . Routine Post Op    reck br    HPI: This patient comes in for post op follow-up .Sheunderwent right lumpectomy and sln on 08/01/13 for ILC. She feels that she is doing well. Redness noted last visit still present, but less; no other sx. Has seen rad onc and med onc scheduled. She wants to do radiation PE:  VITAL SIGNS: BP 130/82  Pulse 76  Temp(Src) 97 F (36.1 C) (Temporal)  Resp 16  Ht 5\' 3"  (1.6 m)  Wt 209 lb 9.6 oz (95.074 kg)  BMI 37.14 kg/m2  General: The patient appears to be healthy, NAD Breast and axillary incisions healing nicely. May be slight erythema of the breast, but looks more like some old blood staining of the skin developing late  DATA REVIEWED: Notes from rad onc  IMPRESSION: The patient is doing well S/P lumpectomy and axillary dissection. Breat redness, improved, don't believe it is infectious   PLAN: Will see in one month unless she has radiation, in which case she should be seen after completion of radiation

## 2013-09-02 NOTE — Patient Instructions (Signed)
See me in one month, but if you have radiation, wait till you have finished

## 2013-09-05 ENCOUNTER — Other Ambulatory Visit: Payer: Self-pay | Admitting: *Deleted

## 2013-09-05 ENCOUNTER — Encounter: Payer: Self-pay | Admitting: *Deleted

## 2013-09-05 DIAGNOSIS — C50511 Malignant neoplasm of lower-outer quadrant of right female breast: Secondary | ICD-10-CM | POA: Insufficient documentation

## 2013-09-05 NOTE — Progress Notes (Signed)
Gave chart to [facility] to complete labs.

## 2013-09-15 ENCOUNTER — Other Ambulatory Visit: Payer: Medicare Other | Admitting: Lab

## 2013-09-15 ENCOUNTER — Telehealth: Payer: Self-pay | Admitting: *Deleted

## 2013-09-15 ENCOUNTER — Ambulatory Visit: Payer: Medicare Other | Admitting: Oncology

## 2013-09-15 ENCOUNTER — Ambulatory Visit: Payer: Medicare Other

## 2013-09-15 NOTE — Telephone Encounter (Signed)
Called pt to inform her that Dr. Welton Flakes is out of the office sick today and we need to reschedule her appt.  She is not happy about this, but will accept it.  I apologized as much as I knew how to.  I rescheduled and confirmed 09/22/13 appt w/ pt.

## 2013-09-22 ENCOUNTER — Ambulatory Visit (HOSPITAL_BASED_OUTPATIENT_CLINIC_OR_DEPARTMENT_OTHER): Payer: Medicare Other | Admitting: Oncology

## 2013-09-22 ENCOUNTER — Encounter: Payer: Self-pay | Admitting: Oncology

## 2013-09-22 ENCOUNTER — Other Ambulatory Visit (HOSPITAL_BASED_OUTPATIENT_CLINIC_OR_DEPARTMENT_OTHER): Payer: Medicare Other | Admitting: Lab

## 2013-09-22 ENCOUNTER — Ambulatory Visit: Payer: Medicare Other

## 2013-09-22 VITALS — BP 167/77 | HR 67 | Temp 97.6°F | Resp 20 | Ht 63.0 in | Wt 211.3 lb

## 2013-09-22 DIAGNOSIS — C50911 Malignant neoplasm of unspecified site of right female breast: Secondary | ICD-10-CM

## 2013-09-22 DIAGNOSIS — C50511 Malignant neoplasm of lower-outer quadrant of right female breast: Secondary | ICD-10-CM

## 2013-09-22 DIAGNOSIS — C50519 Malignant neoplasm of lower-outer quadrant of unspecified female breast: Secondary | ICD-10-CM

## 2013-09-22 DIAGNOSIS — M8580 Other specified disorders of bone density and structure, unspecified site: Secondary | ICD-10-CM

## 2013-09-22 DIAGNOSIS — Z170 Estrogen receptor positive status [ER+]: Secondary | ICD-10-CM

## 2013-09-22 LAB — CBC WITH DIFFERENTIAL/PLATELET
BASO%: 0.3 % (ref 0.0–2.0)
Eosinophils Absolute: 0.1 10*3/uL (ref 0.0–0.5)
MCHC: 32.7 g/dL (ref 31.5–36.0)
MCV: 92.8 fL (ref 79.5–101.0)
MONO#: 0.8 10*3/uL (ref 0.1–0.9)
MONO%: 9.1 % (ref 0.0–14.0)
NEUT#: 5 10*3/uL (ref 1.5–6.5)
RBC: 4.29 10*6/uL (ref 3.70–5.45)
RDW: 13.1 % (ref 11.2–14.5)
WBC: 8.8 10*3/uL (ref 3.9–10.3)

## 2013-09-22 LAB — COMPREHENSIVE METABOLIC PANEL (CC13)
ALT: 10 U/L (ref 0–55)
Albumin: 3.4 g/dL — ABNORMAL LOW (ref 3.5–5.0)
Alkaline Phosphatase: 86 U/L (ref 40–150)
Glucose: 120 mg/dl (ref 70–140)
Potassium: 4.2 mEq/L (ref 3.5–5.1)
Sodium: 140 mEq/L (ref 136–145)
Total Protein: 7.4 g/dL (ref 6.4–8.3)

## 2013-09-22 NOTE — Patient Instructions (Signed)
#1 we discussed her pathology and radiology.  #2 discussed treatment options for stage I breast cancer. You have had a lumpectomy. My recommendation is to proceed with a short course of radiation therapy.  #3 once you have completed this we will put you on antiestrogen therapy with Arimidex. We discussed side effects of it. More information is as below. This would start after you completed radiation therapy.  #4 I will see you back after completion of radiation   Anastrozole tablets What is this medicine? ANASTROZOLE (an AS troe zole) is used to treat breast cancer in women who have gone through menopause. Some types of breast cancer depend on estrogen to grow, and this medicine can stop tumor growth by blocking estrogen production. This medicine may be used for other purposes; ask your health care provider or pharmacist if you have questions. What should I tell my health care provider before I take this medicine? They need to know if you have any of these conditions: -liver disease -an unusual or allergic reaction to anastrozole, other medicines, foods, dyes, or preservatives -pregnant or trying to get pregnant -breast-feeding How should I use this medicine? Take this medicine by mouth with a glass of water. Follow the directions on the prescription label. You can take this medicine with or without food. Take your doses at regular intervals. Do not take your medicine more often than directed. Do not stop taking except on the advice of your doctor or health care professional. Talk to your pediatrician regarding the use of this medicine in children. Special care may be needed. Overdosage: If you think you have taken too much of this medicine contact a poison control center or emergency room at once. NOTE: This medicine is only for you. Do not share this medicine with others. What if I miss a dose? If you miss a dose, take it as soon as you can. If it is almost time for your next dose, take  only that dose. Do not take double or extra doses. What may interact with this medicine? Do not take this medicine with any of the following medications: -female hormones, like estrogens or progestins and birth control pills This medicine may also interact with the following medications: -tamoxifen This list may not describe all possible interactions. Give your health care provider a list of all the medicines, herbs, non-prescription drugs, or dietary supplements you use. Also tell them if you smoke, drink alcohol, or use illegal drugs. Some items may interact with your medicine. What should I watch for while using this medicine? Visit your doctor or health care professional for regular checks on your progress. Let your doctor or health care professional know about any unusual vaginal bleeding. Do not treat yourself for diarrhea, nausea, vomiting or other side effects. Ask your doctor or health care professional for advice. What side effects may I notice from receiving this medicine? Side effects that you should report to your doctor or health care professional as soon as possible: -allergic reactions like skin rash, itching or hives, swelling of the face, lips, or tongue -any new or unusual symptoms -breathing problems -chest pain -leg pain or swelling -vomiting Side effects that usually do not require medical attention (report to your doctor or health care professional if they continue or are bothersome): -back or bone pain -cough, or throat infection -diarrhea or constipation -dizziness -headache -hot flashes -loss of appetite -nausea -sweating -weakness and tiredness -weight gain This list may not describe all possible side effects. Call your  doctor for medical advice about side effects. You may report side effects to FDA at 1-800-FDA-1088. Where should I keep my medicine? Keep out of the reach of children. Store at room temperature between 20 and 25 degrees C (68 and 77 degrees  F). Throw away any unused medicine after the expiration date. NOTE: This sheet is a summary. It may not cover all possible information. If you have questions about this medicine, talk to your doctor, pharmacist, or health care provider.  2013, Elsevier/Gold Standard. (01/24/2008 4:31:52 PM)

## 2013-09-22 NOTE — Progress Notes (Signed)
Michele Palmer [id number] [birth year] 77 y.o. 09/22/2013 4:05 PM  CC  Michele Specking., MD [address] [place] [address] Dr. Lance Bosch Dr. Cyndia Bent  REASON FOR CONSULTATION:  77 year old female with new diagnosis of right breast s/p lumpectomy in September 2014  STAGE:   T1N0 (stage I) ER+/PR+/Her2Neu-, Ki-67 11%  REFERRING PHYSICIAN: Dr. Cyndia Bent  HISTORY OF PRESENT ILLNESS:  Michele Palmer is a 77 y.o. female.  Would medical history significant for hypertension gastroesophageal reflux disease and IBS. Patient underwent a screening mammogram in June 2014 was found to have an abnormality in the right breast. She had a biopsy of this lesion performed on 05/27/2013. The pathology revealed atypical cells suspicious for carcinoma. On 06/10/2013 patient had a repeat biopsy performed and that did confirm invasive lobular carcinoma that was ER positive PR positive HER-2/neu negative with a low proliferation marker Ki-67. She was seen by Dr. Cyndia Bent and had a right breast lumpectomy and sentinel lymph node biopsy on 08/01/2013. The final pathology revealed a 1.8 cm invasive lobular carcinoma, grade 1 margins were cleared 2 sentinel nodes were negative for metastatic disease. Tumor was ER positive PR positive HER-2/neu negative with a proliferation marker Ki-67 11%. Pathologic stage TI C. N0 (stage I). Postoperatively she has been seen by Dr. Thersa Salt in [place] for adjuvant radiation therapy. He had an extensive discussion with her regarding the role of adjuvant radiation therapy in invasive breast cancer and potential side effects. Patient is now seen in medical oncology for discussion of adjuvant systemic therapy. She herself is without any complaints. She is very concerned about side effects from systemic treatment.   Past Medical History: Past Medical History  Diagnosis Date  . Anemia   . Arthritis   . Cancer   . GERD (gastroesophageal reflux disease)   .  Anxiety   . Depression   . Breast cancer, right breast 07/04/2013    ILC, 1.8 cm, neg sln receptor+, surgery on 08/01/13     Past Surgical History: Past Surgical History  Procedure Laterality Date  . Ovary surgery      removed ovaries  . Salpingoophorectomy    . Appendectomy  1966  . Tubal ligation    . Breast lumpectomy with needle localization and axillary sentinel lymph node bx Right 08/01/2013    Procedure: BREAST LUMPECTOMY WITH NEEDLE LOCALIZATION AND AXILLARY SENTINEL LYMPH NODE BIOPSY;  Surgeon: Currie Paris, MD;  Location: [facility];  Service: General;  Laterality: Right;    Family History: Family History  Problem Relation Age of Onset  . Stroke Mother   . Stroke Father   . Cancer Sister     ovarian    Social History History  Substance Use Topics  . Smoking status: Never Smoker   . Smokeless tobacco: Never Used  . Alcohol Use: No    Allergies: Allergies  Allergen Reactions  . Ciprofloxacin Hives  . Codeine Other (See Comments)    Headache    Current Medications: Current Outpatient Prescriptions  Medication Sig Dispense Refill  . citalopram (CELEXA) 10 MG tablet Take 10 mg by mouth daily.      . Lactobacillus (FLORAJEN ACIDOPHILUS PO) Take by mouth.      . losartan (COZAAR) 100 MG tablet Take 100 mg by mouth daily.      Marland Kitchen ALPRAZolam (XANAX) 0.25 MG tablet Take 0.25 mg by mouth 2 (two) times daily.      . ranitidine (ZANTAC) 150 MG capsule  Take 150 mg by mouth 2 (two) times daily.      . traMADol (ULTRAM) 50 MG tablet Take 1 tablet (50 mg total) by mouth every 6 (six) hours as needed for pain.  30 tablet  1   No current facility-administered medications for this visit.    OB/GYN History: menarche at 46, menopause at 13, no HRT, first life birth at 39 (83)  Fertility Discussion: n/a Prior History of Cancer: skin cancer  Health Maintenance:  Colonoscopy yes 12/25/11 Bone Density 2010 Last PAP smear unknown  ECOG PERFORMANCE STATUS:  0 - Asymptomatic  Genetic Counseling/testing: no  REVIEW OF SYSTEMS:  A comprehensive 14 point review of system was obtained and it is scan separately in the electronic medical record  PHYSICAL EXAMINATION: Blood pressure 167/77, pulse 67, temperature 97.6 F (36.4 C), temperature source Oral, resp. rate 20, height 5\' 3"  (1.6 m), weight 211 lb 4.8 oz (95.845 kg).  ZOX:WRUEA, healthy, no distress, well nourished and well developed SKIN: skin color, texture, turgor are normal HEAD: Normocephalic EYES: PERRLA, EOMI, Conjunctiva are pink and non-injected EARS: External ears normal OROPHARYNX:no exudate, no erythema and lips, buccal mucosa, and tongue normal  NECK: supple, no adenopathy LYMPH:  no palpable lymphadenopathy, no hepatosplenomegaly BREAST:left breast normal without mass, skin or nipple changes or axillary nodes, surgical scars noted in the right breast is well healed without any evidence of infections LUNGS: clear to auscultation  HEART: regular rate & rhythm ABDOMEN:abdomen soft, non-tender, normal bowel sounds and no masses or organomegaly BACK: No CVA tenderness EXTREMITIES:no edema, no clubbing, no cyanosis  NEURO: alert & oriented x 3 with fluent speech, no focal motor/sensory deficits, gait normal     STUDIES/RESULTS: No results found.   LABS:    Chemistry      Component Value Date/Time   NA 140 09/22/2013 1501   NA 139 07/29/2013 0930   K 4.2 09/22/2013 1501   K 4.0 07/29/2013 0930   CL 103 07/29/2013 0930   CO2 28 09/22/2013 1501   CO2 25 07/29/2013 0930   BUN 26.0 09/22/2013 1501   BUN 30* 07/29/2013 0930   CREATININE 0.8 09/22/2013 1501   CREATININE 0.69 07/29/2013 0930      Component Value Date/Time   CALCIUM 9.5 09/22/2013 1501   CALCIUM 9.7 07/29/2013 0930   ALKPHOS 86 09/22/2013 1501   ALKPHOS 85 07/29/2013 0930   AST 12 09/22/2013 1501   AST 14 07/29/2013 0930   ALT 10 09/22/2013 1501   ALT 11 07/29/2013 0930   BILITOT 0.31 09/22/2013 1501   BILITOT  0.2* 07/29/2013 0930      Lab Results  Component Value Date   WBC 8.8 09/22/2013   HGB 13.0 09/22/2013   HCT 39.8 09/22/2013   MCV 92.8 09/22/2013   PLT 203 09/22/2013    PATHOLOGY: ADDITIONAL INFORMATION: 1. PROGNOSTIC INDICATORS - ACIS Results: IMMUNOHISTOCHEMICAL AND MORPHOMETRIC ANALYSIS BY THE AUTOMATED CELLULAR IMAGING SYSTEM (ACIS) Estrogen Receptor: 100%, POSITIVE, STRONG STAINING INTENSITY Progesterone Receptor: 100%, POSITIVE, STRONG STAINING INTENSITY Proliferation Marker Ki67: 11% REFERENCE RANGE ESTROGEN RECEPTOR NEGATIVE <1% POSITIVE =>1% PROGESTERONE RECEPTOR NEGATIVE <1% POSITIVE =>1% All controls stained appropriately Pecola Leisure MD Pathologist, Electronic Signature ( Signed 08/08/2013) 1. CHROMOGENIC IN-SITU HYBRIDIZATION Results: HER-2/NEU BY CISH - NO AMPLIFICATION OF HER-2 DETECTED. RESULT RATIO OF HER2: CEP 17 SIGNALS 1.19 AVERAGE HER2 COPY NUMBER PER CELL 1.55 REFERENCE RANGE 1 of 4 FINAL for Michele Palmer, Michele Palmer ([id number]) ADDITIONAL INFORMATION:(continued) NEGATIVE HER2/Chr17 Ratio <2.0 and Average HER2 copy  number <4.0 EQUIVOCAL HER2/Chr17 Ratio <2.0 and Average HER2 copy number 4.0 and <6.0 POSITIVE HER2/Chr17 Ratio >=2.0 and/or Average HER2 copy number >=6.0 Pecola Leisure MD Pathologist, Electronic Signature ( Signed 08/07/2013) FINAL DIAGNOSIS Diagnosis 1. Breast, lumpectomy, Right - INVASIVE LOBULAR CARCINOMA, GRADE I/III, SPANNING 1.8 CM. - LOBULAR CARCINOMA IN SITU. - PERINEURAL INVASION IS IDENTIFIED. - INVASIVE CARCINOMA IS FOCALLY LESS THAN 0.1 CM FROM THE INFERIOR MARGIN ON SPECIMEN # 1. - SEE ONCOLOGY TABLE BELOW. 2. Breast, excision, right additional deep margin - BENIGN SOFT TISSUE. - THERE IS NO EVIDENCE OF MALIGNANCY. - SEE COMMENT. 3. Breast, excision, right additional medial and inferior margin - ATYPICAL LOBULAR HYPERPLASIA. - SEE COMMENT. 4. Lymph node, sentinel, biopsy, Right axilla #1 - THERE IS NO EVIDENCE OF  CARCINOMA IN 1 OF 1 LYMPH NODE (0/1). - SEE COMMENT. 5. Lymph node, sentinel, biopsy, Right axilla #2 - THERE IS NO EVIDENCE OF CARCINOMA IN 1 OF 1 LYMPH NODE (0/1). - SEE COMMENT. Microscopic Comment 1. BREAST, INVASIVE TUMOR, WITH LYMPH NODE SAMPLING Specimen, including laterality and lymph node sampling (sentinel, non-sentinel): Right breast lumpectomy, additional margins, and two right axillary sentinel nodes. Procedure: Lumpectomy and sentinel node resection. Histologic type: Lobular. Grade: I Tubule formation: 3 Nuclear pleomorphism: 1 Mitotic:1 Tumor size (gross measurement): 1.8 cm Margins: Invasive, distance to closest margin: Focally less than 0.1 cm from the inferior margin of specimen # 1, see comment. Lymphovascular invasion: Not identified. Ductal carcinoma in situ: Not identified. Tumor focality: Unifocal 2 of 4 FINAL for Michele Palmer, Michele Palmer ([id number]) Microscopic Comment(continued) Treatment effect: N/A Extent of tumor: Confined to breast parenchyma. Lymph nodes: Examined: 2 Sentinel 0 Non-sentinel 2 Total Lymph nodes with metastasis: 0 Breast prognostic profile: Will be repeated on the current case and the results reported separately. TNM: pT1c, pN0 Comments: The invasive cells are negative for E-cadherin and positive for cytokeratin AE1/AE3, supporting the above diagnosis. Although the inferior margin of specimen #1 is close, the final inferior margin (specimen #3) is negative, and thus the inferior margin is deemed negative for carcinoma.(JBK:gt, 08/05/13) 2. and 3. The surgical resection margin(s) of the specimen were inked and microscopically evaluated. 4. and 5. Immunohistochemical stains performed on parts 4 and 5 fail to highlight the presence of cytokeratin positive tumor cells. Pecola Leisure MD Pathologist, Electronic Signature (Case signed 08/05/2013) Specim ASSESSMENT    77 year old female with  #1 new diagnosis of invasive lobular carcinoma of  the right breast status post lumpectomy with sentinel lymph node biopsy on 08/01/2013. Her final pathology revealed a 1.8 cm invasive lobular carcinoma ER positive PR positive HER-2/neu negative with a proliferation marker Ki-67 11%. 3 sentinel nodes were negative for metastatic disease.  #2 patient is seen for discussion of adjuvant treatment options. She has a hard he been seen by radiation oncology.  #3 I have discussed with her the role of antiestrogen therapy as well as systemic chemotherapy. We discussed the indication and rationale for each 1. We discussed Oncotype DX to determine whether or not she would be a candidate for chemotherapy.patient stated that she does not want chemotherapy whatsoever there for Oncotype DX will not be performed. In the meantime we did discuss antiestrogen therapy to help prevent distant recurrence. We discussed different types including tamoxifen versus aromatase inhibitors. We discussed side effects of the  class of drugs. Patient is quite concerned about the side effects. Her daughter is also concerned that her mother may or may not be compliant due to the side effects that  the patient has a hard he been reading about and has heard about from her friends. I did reiterate to her back different people react differently to their medications. Certainly this does concern me if patient does not undergo radiation therapy initially.  Clinical Trial Eligibility: no Multidisciplinary conference discussion no     PLAN:    #1 I will refer the patient back to Dr. Thersa Salt due to the possibility of patient's noncompliance she may be best served to get at least a short course of radiation therapy to the right breast.  #2 in meantime I have set her up for a bone density scan.  #3 I discussed with her the different side effects. I would give her an aromatase inhibitor such as Arimidex. I gave her literature on this so that she may repeat at as well.    #4 I will plan on seeing  her back after completion of radiation therapy  Thank you so much for allowing me to participate in the care of Michele Palmer. I will continue to follow up the patient with you and assist in her care.  All questions were answered. The patient knows to call the clinic with any problems, questions or concerns. We can certainly see the patient much sooner if necessary.  I spent 40 minutes counseling the patient face to face. The total time spent in the appointment was 60 minutes.  Drue Second, MD Medical/Oncology [facility] [phone] (beeper) [phone] (Office)  09/22/2013, 4:05 PM

## 2013-09-22 NOTE — Progress Notes (Signed)
Checked in new patient with no financial issues. Has not traveled and gave appt card and [facility] packet.

## 2013-09-25 ENCOUNTER — Telehealth: Payer: Self-pay | Admitting: Oncology

## 2013-09-25 NOTE — Telephone Encounter (Signed)
, °

## 2013-10-06 ENCOUNTER — Telehealth: Payer: Self-pay | Admitting: *Deleted

## 2013-10-06 NOTE — Telephone Encounter (Signed)
Pt called in question about her bone density appt and stated that Dr. Welton Flakes told her that it was tomorrow, but she didn't know where or what time.  I looked in the system and didn't see anything scheduled.  Upon further digging, I saw the note from [facility] to schedule this appt and so I told the pt to call them to get it scheduled and if she had any problems to please contact the office.  Emailed [facility] to make her aware.

## 2013-10-09 ENCOUNTER — Encounter (INDEPENDENT_AMBULATORY_CARE_PROVIDER_SITE_OTHER): Payer: Medicare Other | Admitting: Surgery

## 2013-10-13 ENCOUNTER — Encounter: Payer: Self-pay | Admitting: *Deleted

## 2013-10-13 NOTE — Progress Notes (Signed)
Mailed after appt letter to pt. 

## 2013-11-06 ENCOUNTER — Ambulatory Visit: Payer: Medicare Other | Admitting: Oncology

## 2013-11-10 ENCOUNTER — Encounter (INDEPENDENT_AMBULATORY_CARE_PROVIDER_SITE_OTHER): Payer: Self-pay

## 2013-11-10 ENCOUNTER — Ambulatory Visit (HOSPITAL_BASED_OUTPATIENT_CLINIC_OR_DEPARTMENT_OTHER): Payer: Medicare Other | Admitting: Oncology

## 2013-11-10 ENCOUNTER — Telehealth: Payer: Self-pay | Admitting: *Deleted

## 2013-11-10 VITALS — BP 161/84 | HR 68 | Temp 98.0°F | Resp 20 | Ht 63.0 in | Wt 213.5 lb

## 2013-11-10 DIAGNOSIS — C50911 Malignant neoplasm of unspecified site of right female breast: Secondary | ICD-10-CM

## 2013-11-10 DIAGNOSIS — Z170 Estrogen receptor positive status [ER+]: Secondary | ICD-10-CM

## 2013-11-10 DIAGNOSIS — C50919 Malignant neoplasm of unspecified site of unspecified female breast: Secondary | ICD-10-CM

## 2013-11-10 MED ORDER — ANASTROZOLE 1 MG PO TABS: 1.0000 mg | ORAL_TABLET | Freq: Every day | ORAL | Status: AC

## 2013-11-10 NOTE — Telephone Encounter (Signed)
appts made and printed...td 

## 2013-11-10 NOTE — Patient Instructions (Signed)

## 2013-11-11 ENCOUNTER — Ambulatory Visit
Admission: RE | Admit: 2013-11-11 | Discharge: 2013-11-11 | Disposition: A | Discharge status: Home / Self Care | Payer: Medicare Other | Source: Ambulatory Visit | Attending: Oncology | Admitting: Oncology

## 2013-11-11 DIAGNOSIS — C50911 Malignant neoplasm of unspecified site of right female breast: Secondary | ICD-10-CM

## 2013-11-11 DIAGNOSIS — M8580 Other specified disorders of bone density and structure, unspecified site: Secondary | ICD-10-CM

## 2013-11-14 NOTE — Progress Notes (Signed)
[name] [id number] [birth year] 77 y.o. 11/14/2013 1:48 AM  CC  Ignatius Specking., MD [address] [place] [address] Dr. Lance Bosch Dr. Cyndia Bent  diagnosis:  77 year old female with new diagnosis of right breast s/p lumpectomy in September 2014  STAGE:   T1N0 (stage I) ER+/PR+/Her2Neu-, Ki-67 11%  REFERRING PHYSICIAN: Dr. Cyndia Bent  Prior oncologic history:  Michele Palmer is a 77 y.o. female.  Would medical history significant for hypertension gastroesophageal reflux disease and IBS.   #1Patient underwent a screening mammogram in June 2014 was found to have an abnormality in the right breast. She had a biopsy of this lesion performed on 05/27/2013. The pathology revealed atypical cells suspicious for carcinoma. On 06/10/2013 patient had a repeat biopsy performed and that did confirm invasive lobular carcinoma that was ER positive PR positive HER-2/neu negative with a low proliferation marker Ki-67. She was seen by Dr. Cyndia Bent and had a right breast lumpectomy and sentinel lymph node biopsy on 08/01/2013. The final pathology revealed a 1.8 cm invasive lobular carcinoma, grade 1 margins were cleared 2 sentinel nodes were negative for metastatic disease. Tumor was ER positive PR positive HER-2/neu negative with a proliferation marker Ki-67 11%. Pathologic stage TI C. N0 (stage I). Postoperatively she has been seen by Dr. Thersa Salt in [place] for adjuvant radiation therapy. He had an extensive discussion with her regarding the role of adjuvant radiation therapy in invasive breast cancer and potential side effects.  #2 patient is status post radiation therapy given by Dr. Erskine Speed. She tolerated it very well.  #3 adjuvant antiestrogen therapy with Arimidex 1 mg daily starting 11/10/2013 total of 5 years curative intent  Current therapy: Arimidex  Interval history: patient is seen in followup after radiation overall she states that she tolerated the radiation  well except for some fatigue and soreness in the breast. She has no nausea vomiting no fevers chills night sweats. We had an extensive discussion regarding adjuvant antiestrogen therapy with either tamoxifen or an aromatase inhibitor.she is agreeable to begin these. In addition to her pharmacy. I explained the side effects. Remainder of the template review of systems is negative. Past Medical History: Past Medical History  Diagnosis Date  . Anemia   . Arthritis   . Cancer   . GERD (gastroesophageal reflux disease)   . Anxiety   . Depression   . Breast cancer, right breast 07/04/2013    ILC, 1.8 cm, neg sln receptor+, surgery on 08/01/13     Past Surgical History: Past Surgical History  Procedure Laterality Date  . Ovary surgery      removed ovaries  . Salpingoophorectomy    . Appendectomy  1966  . Tubal ligation    . Breast lumpectomy with needle localization and axillary sentinel lymph node bx Right 08/01/2013    Procedure: BREAST LUMPECTOMY WITH NEEDLE LOCALIZATION AND AXILLARY SENTINEL LYMPH NODE BIOPSY;  Surgeon: Currie Paris, MD;  Location: [facility];  Service: General;  Laterality: Right;    Family History: Family History  Problem Relation Age of Onset  . Stroke Mother   . Stroke Father   . Cancer Sister     ovarian    Social History History  Substance Use Topics  . Smoking status: Never Smoker   . Smokeless tobacco: Never Used  . Alcohol Use: No    Allergies: Allergies  Allergen Reactions  . Ciprofloxacin Hives  . Codeine Other (See Comments)    Headache  Current Medications: Current Outpatient Prescriptions  Medication Sig Dispense Refill  . ALPRAZolam (XANAX) 0.25 MG tablet Take 0.25 mg by mouth 2 (two) times daily.      . citalopram (CELEXA) 10 MG tablet Take 10 mg by mouth daily.      Marland Kitchen losartan (COZAAR) 100 MG tablet Take 100 mg by mouth daily.      . ranitidine (ZANTAC) 150 MG capsule Take 150 mg by mouth 2 (two) times daily.       Marland Kitchen anastrozole (ARIMIDEX) 1 MG tablet Take 1 tablet (1 mg total) by mouth daily.  90 tablet  12   No current facility-administered medications for this visit.    OB/GYN History: menarche at 36, menopause at 81, no HRT, first life birth at 55 (1)  Fertility Discussion: n/a Prior History of Cancer: skin cancer  Health Maintenance:  Colonoscopy yes 12/25/11 Bone Density 2010 Last PAP smear unknown  ECOG PERFORMANCE STATUS: 0 - Asymptomatic  Genetic Counseling/testing: no  REVIEW OF SYSTEMS:  A comprehensive 14 point review of system was obtained and it is scan separately in the electronic medical record  PHYSICAL EXAMINATION: Blood pressure 161/84, pulse 68, temperature 98 F (36.7 C), temperature source Oral, resp. rate 20, height 5\' 3"  (1.6 m), weight 213 lb 8 oz (96.843 kg).  ZOX:WRUEA, healthy, no distress, well nourished and well developed SKIN: skin color, texture, turgor are normal HEAD: Normocephalic EYES: PERRLA, EOMI, Conjunctiva are pink and non-injected EARS: External ears normal OROPHARYNX:no exudate, no erythema and lips, buccal mucosa, and tongue normal  NECK: supple, no adenopathy LYMPH:  no palpable lymphadenopathy, no hepatosplenomegaly BREAST:left breast normal without mass, skin or nipple changes or axillary nodes, surgical scars noted in the right breast is well healed without any evidence of infections LUNGS: clear to auscultation  HEART: regular rate & rhythm ABDOMEN:abdomen soft, non-tender, normal bowel sounds and no masses or organomegaly BACK: No CVA tenderness EXTREMITIES:no edema, no clubbing, no cyanosis  NEURO: alert & oriented x 3 with fluent speech, no focal motor/sensory deficits, gait normal     STUDIES/RESULTS: No results found.   LABS:    Chemistry      Component Value Date/Time   NA 140 09/22/2013 1501   NA 139 07/29/2013 0930   K 4.2 09/22/2013 1501   K 4.0 07/29/2013 0930   CL 103 07/29/2013 0930   CO2 28 09/22/2013 1501    CO2 25 07/29/2013 0930   BUN 26.0 09/22/2013 1501   BUN 30* 07/29/2013 0930   CREATININE 0.8 09/22/2013 1501   CREATININE 0.69 07/29/2013 0930      Component Value Date/Time   CALCIUM 9.5 09/22/2013 1501   CALCIUM 9.7 07/29/2013 0930   ALKPHOS 86 09/22/2013 1501   ALKPHOS 85 07/29/2013 0930   AST 12 09/22/2013 1501   AST 14 07/29/2013 0930   ALT 10 09/22/2013 1501   ALT 11 07/29/2013 0930   BILITOT 0.31 09/22/2013 1501   BILITOT 0.2* 07/29/2013 0930      Lab Results  Component Value Date   WBC 8.8 09/22/2013   HGB 13.0 09/22/2013   HCT 39.8 09/22/2013   MCV 92.8 09/22/2013   PLT 203 09/22/2013    PATHOLOGY: ADDITIONAL INFORMATION: 1. PROGNOSTIC INDICATORS - ACIS Results: IMMUNOHISTOCHEMICAL AND MORPHOMETRIC ANALYSIS BY THE AUTOMATED CELLULAR IMAGING SYSTEM (ACIS) Estrogen Receptor: 100%, POSITIVE, STRONG STAINING INTENSITY Progesterone Receptor: 100%, POSITIVE, STRONG STAINING INTENSITY Proliferation Marker Ki67: 11% REFERENCE RANGE ESTROGEN RECEPTOR NEGATIVE <1% POSITIVE =>1% PROGESTERONE RECEPTOR NEGATIVE <1% POSITIVE =>  1% All controls stained appropriately Pecola Leisure MD Pathologist, Electronic Signature ( Signed 08/08/2013) 1. CHROMOGENIC IN-SITU HYBRIDIZATION Results: HER-2/NEU BY CISH - NO AMPLIFICATION OF HER-2 DETECTED. RESULT RATIO OF HER2: CEP 17 SIGNALS 1.19 AVERAGE HER2 COPY NUMBER PER CELL 1.55 REFERENCE RANGE 1 of 4 FINAL for ALPHIA, BEHANNA ([id number]) ADDITIONAL INFORMATION:(continued) NEGATIVE HER2/Chr17 Ratio <2.0 and Average HER2 copy number <4.0 EQUIVOCAL HER2/Chr17 Ratio <2.0 and Average HER2 copy number 4.0 and <6.0 POSITIVE HER2/Chr17 Ratio >=2.0 and/or Average HER2 copy number >=6.0 Pecola Leisure MD Pathologist, Electronic Signature ( Signed 08/07/2013) FINAL DIAGNOSIS Diagnosis 1. Breast, lumpectomy, Right - INVASIVE LOBULAR CARCINOMA, GRADE I/III, SPANNING 1.8 CM. - LOBULAR CARCINOMA IN SITU. - PERINEURAL INVASION IS IDENTIFIED. -  INVASIVE CARCINOMA IS FOCALLY LESS THAN 0.1 CM FROM THE INFERIOR MARGIN ON SPECIMEN # 1. - SEE ONCOLOGY TABLE BELOW. 2. Breast, excision, right additional deep margin - BENIGN SOFT TISSUE. - THERE IS NO EVIDENCE OF MALIGNANCY. - SEE COMMENT. 3. Breast, excision, right additional medial and inferior margin - ATYPICAL LOBULAR HYPERPLASIA. - SEE COMMENT. 4. Lymph node, sentinel, biopsy, Right axilla #1 - THERE IS NO EVIDENCE OF CARCINOMA IN 1 OF 1 LYMPH NODE (0/1). - SEE COMMENT. 5. Lymph node, sentinel, biopsy, Right axilla #2 - THERE IS NO EVIDENCE OF CARCINOMA IN 1 OF 1 LYMPH NODE (0/1). - SEE COMMENT. Microscopic Comment 1. BREAST, INVASIVE TUMOR, WITH LYMPH NODE SAMPLING Specimen, including laterality and lymph node sampling (sentinel, non-sentinel): Right breast lumpectomy, additional margins, and two right axillary sentinel nodes. Procedure: Lumpectomy and sentinel node resection. Histologic type: Lobular. Grade: I Tubule formation: 3 Nuclear pleomorphism: 1 Mitotic:1 Tumor size (gross measurement): 1.8 cm Margins: Invasive, distance to closest margin: Focally less than 0.1 cm from the inferior margin of specimen # 1, see comment. Lymphovascular invasion: Not identified. Ductal carcinoma in situ: Not identified. Tumor focality: Unifocal 2 of 4 FINAL for Michele Palmer, Michele Palmer ([id number]) Microscopic Comment(continued) Treatment effect: N/A Extent of tumor: Confined to breast parenchyma. Lymph nodes: Examined: 2 Sentinel 0 Non-sentinel 2 Total Lymph nodes with metastasis: 0 Breast prognostic profile: Will be repeated on the current case and the results reported separately. TNM: pT1c, pN0 Comments: The invasive cells are negative for E-cadherin and positive for cytokeratin AE1/AE3, supporting the above diagnosis. Although the inferior margin of specimen #1 is close, the final inferior margin (specimen #3) is negative, and thus the inferior margin is deemed negative for  carcinoma.(JBK:gt, 08/05/13) 2. and 3. The surgical resection margin(s) of the specimen were inked and microscopically evaluated. 4. and 5. Immunohistochemical stains performed on parts 4 and 5 fail to highlight the presence of cytokeratin positive tumor cells. Pecola Leisure MD Pathologist, Electronic Signature (Case signed 08/05/2013) Specim ASSESSMENT    77 year old female with  #1 new diagnosis of invasive lobular carcinoma of the right breast status post lumpectomy with sentinel lymph node biopsy on 08/01/2013. Her final pathology revealed a 1.8 cm invasive lobular carcinoma ER positive PR positive HER-2/neu negative with a proliferation marker Ki-67 11%. 3 sentinel nodes were negative for metastatic disease.  #2status post radiation therapy by Dr. Thersa Salt  #3 proceed with Arimidex 1 mg daily adjuvantly with curative intent. Risks benefits and side effects were explained to the patient. For 5 years of therapy is planned  PLAN: #1 proceed with Arimidex 1 mg daily.  #2 patient will be seen back in 3 months.  Thank you so much for allowing me to participate in the care of ANZAL BARTNICK. I will  continue to follow up the patient with you and assist in her care.  All questions were answered. The patient knows to call the clinic with any problems, questions or concerns. We can certainly see the patient much sooner if necessary.  I spent 20 minutes counseling the patient face to face. The total time spent in the appointment was 30 minutes.  Drue Second, MD Medical/Oncology [facility] [phone] (beeper) [phone] (Office)  11/14/2013, 1:48 AM

## 2014-02-09 ENCOUNTER — Other Ambulatory Visit (HOSPITAL_BASED_OUTPATIENT_CLINIC_OR_DEPARTMENT_OTHER): Payer: Medicare Other

## 2014-02-09 ENCOUNTER — Telehealth: Payer: Self-pay | Admitting: *Deleted

## 2014-02-09 ENCOUNTER — Ambulatory Visit (HOSPITAL_BASED_OUTPATIENT_CLINIC_OR_DEPARTMENT_OTHER): Payer: Medicare Other | Admitting: Oncology

## 2014-02-09 VITALS — BP 142/82 | HR 69 | Temp 98.0°F | Resp 18 | Ht 63.0 in | Wt 220.3 lb

## 2014-02-09 DIAGNOSIS — Z170 Estrogen receptor positive status [ER+]: Secondary | ICD-10-CM

## 2014-02-09 DIAGNOSIS — C50911 Malignant neoplasm of unspecified site of right female breast: Secondary | ICD-10-CM

## 2014-02-09 DIAGNOSIS — C50519 Malignant neoplasm of lower-outer quadrant of unspecified female breast: Secondary | ICD-10-CM

## 2014-02-09 DIAGNOSIS — C50919 Malignant neoplasm of unspecified site of unspecified female breast: Secondary | ICD-10-CM

## 2014-02-09 LAB — CBC WITH DIFFERENTIAL/PLATELET
BASO%: 0.8 % (ref 0.0–2.0)
BASOS ABS: 0.1 10*3/uL (ref 0.0–0.1)
EOS ABS: 0.1 10*3/uL (ref 0.0–0.5)
EOS%: 1.4 % (ref 0.0–7.0)
HCT: 38 % (ref 34.8–46.6)
HEMOGLOBIN: 12.4 g/dL (ref 11.6–15.9)
LYMPH#: 1.9 10*3/uL (ref 0.9–3.3)
LYMPH%: 22.6 % (ref 14.0–49.7)
MCH: 30.2 pg (ref 25.1–34.0)
MCHC: 32.7 g/dL (ref 31.5–36.0)
MCV: 92.5 fL (ref 79.5–101.0)
MONO#: 0.9 10*3/uL (ref 0.1–0.9)
MONO%: 10.6 % (ref 0.0–14.0)
NEUT%: 64.6 % (ref 38.4–76.8)
NEUTROS ABS: 5.4 10*3/uL (ref 1.5–6.5)
Platelets: 194 10*3/uL (ref 145–400)
RBC: 4.11 10*6/uL (ref 3.70–5.45)
RDW: 13.1 % (ref 11.2–14.5)
WBC: 8.4 10*3/uL (ref 3.9–10.3)

## 2014-02-09 LAB — COMPREHENSIVE METABOLIC PANEL (CC13)
ALBUMIN: 3.5 g/dL (ref 3.5–5.0)
ALT: 13 U/L (ref 0–55)
AST: 13 U/L (ref 5–34)
Alkaline Phosphatase: 89 U/L (ref 40–150)
Anion Gap: 10 mEq/L (ref 3–11)
BUN: 29 mg/dL — AB (ref 7.0–26.0)
CALCIUM: 9.7 mg/dL (ref 8.4–10.4)
CHLORIDE: 104 meq/L (ref 98–109)
CO2: 27 meq/L (ref 22–29)
Creatinine: 0.8 mg/dL (ref 0.6–1.1)
GLUCOSE: 128 mg/dL (ref 70–140)
POTASSIUM: 4.5 meq/L (ref 3.5–5.1)
Sodium: 141 mEq/L (ref 136–145)
Total Bilirubin: 0.33 mg/dL (ref 0.20–1.20)
Total Protein: 7.1 g/dL (ref 6.4–8.3)

## 2014-02-09 MED ORDER — CHOLECALCIFEROL 50 MCG (2000 UT) PO TABS: 2000.0000 [IU] | ORAL_TABLET | Freq: Every day | ORAL | Status: DC

## 2014-02-09 MED ORDER — ALENDRONATE SODIUM 70 MG PO TABS: 70.0000 mg | ORAL_TABLET | ORAL | Status: DC

## 2014-02-09 MED ORDER — ANASTROZOLE 1 MG PO TABS: 1.0000 mg | ORAL_TABLET | Freq: Every day | ORAL | Status: DC

## 2014-02-09 NOTE — Patient Instructions (Signed)
Alendronate tablets What is this medicine? ALENDRONATE (a LEN droe nate) slows calcium loss from bones. It helps to make normal healthy bone and to slow bone loss in people with Paget's disease and osteoporosis. It may be used in others at risk for bone loss. This medicine may be used for other purposes; ask your health care provider or pharmacist if you have questions. COMMON BRAND NAME(S): Fosamax What should I tell my health care provider before I take this medicine? They need to know if you have any of these conditions: -dental disease -esophagus, stomach, or intestine problems, like acid reflux or GERD -kidney disease -low blood calcium -low vitamin D -problems sitting or standing 30 minutes -trouble swallowing -an unusual or allergic reaction to alendronate, other medicines, foods, dyes, or preservatives -pregnant or trying to get pregnant -breast-feeding How should I use this medicine? You must take this medicine exactly as directed or you will lower the amount of the medicine you absorb into your body or you may cause yourself harm. Take this medicine by mouth first thing in the morning, after you are up for the day. Do not eat or drink anything before you take your medicine. Swallow the tablet with a full glass (6 to 8 fluid ounces) of plain water. Do not take this medicine with any other drink. Do not chew or crush the tablet. After taking this medicine, do not eat breakfast, drink, or take any medicines or vitamins for at least 30 minutes. Sit or stand up for at least 30 minutes after you take this medicine; do not lie down. Do not take your medicine more often than directed. Talk to your pediatrician regarding the use of this medicine in children. Special care may be needed. Overdosage: If you think you have taken too much of this medicine contact a poison control center or emergency room at once. NOTE: This medicine is only for you. Do not share this medicine with others. What if I  miss a dose? If you miss a dose, do not take it later in the day. Continue your normal schedule starting the next morning. Do not take double or extra doses. What may interact with this medicine? -aluminum hydroxide -antacids -aspirin -calcium supplements -drugs for inflammation like ibuprofen, naproxen, and others -iron supplements -magnesium supplements -vitamins with minerals This list may not describe all possible interactions. Give your health care provider a list of all the medicines, herbs, non-prescription drugs, or dietary supplements you use. Also tell them if you smoke, drink alcohol, or use illegal drugs. Some items may interact with your medicine. What should I watch for while using this medicine? Visit your doctor or health care professional for regular checks ups. It may be some time before you see benefit from this medicine. Do not stop taking your medicine except on your doctor's advice. Your doctor or health care professional may order blood tests and other tests to see how you are doing. You should make sure you get enough calcium and vitamin D while you are taking this medicine, unless your doctor tells you not to. Discuss the foods you eat and the vitamins you take with your health care professional. Some people who take this medicine have severe bone, joint, and/or muscle pain. This medicine may also increase your risk for a broken thigh bone. Tell your doctor right away if you have pain in your upper leg or groin. Tell your doctor if you have any pain that does not go away or that gets worse.  This medicine can make you more sensitive to the sun. If you get a rash while taking this medicine, sunlight may cause the rash to get worse. Keep out of the sun. If you cannot avoid being in the sun, wear protective clothing and use sunscreen. Do not use sun lamps or tanning beds/booths. What side effects may I notice from receiving this medicine? Side effects that you should report to  your doctor or health care professional as soon as possible: -allergic reactions like skin rash, itching or hives, swelling of the face, lips, or tongue -black or tarry stools -bone, muscle or joint pain -changes in vision -chest pain -heartburn or stomach pain -jaw pain, especially after dental work -pain or trouble when swallowing -redness, blistering, peeling or loosening of the skin, including inside the mouth Side effects that usually do not require medical attention (report to your doctor or health care professional if they continue or are bothersome): -changes in taste -diarrhea or constipation -eye pain or itching -headache -nausea or vomiting -stomach gas or fullness This list may not describe all possible side effects. Call your doctor for medical advice about side effects. You may report side effects to FDA at 1-800-FDA-1088. Where should I keep my medicine? Keep out of the reach of children. Store at room temperature of 15 and 30 degrees C (59 and 86 degrees F). Throw away any unused medicine after the expiration date. NOTE: This sheet is a summary. It may not cover all possible information. If you have questions about this medicine, talk to your doctor, pharmacist, or health care provider.  2014, Elsevier/Gold Standard. (2011-05-12 08:56:09)  

## 2014-02-09 NOTE — Telephone Encounter (Signed)
appts made and printed...td 

## 2014-02-12 ENCOUNTER — Encounter: Payer: Self-pay | Admitting: Oncology

## 2014-02-12 NOTE — Progress Notes (Signed)
OFFICE PROGRESS NOTE  CC  Michele Palmer,Michele B., MD [place] [address]  DIAGNOSIS: 78 year old female with diagnosis of right breast cancer s/p lumpectomy in September 2014  STAGE:  T1N0 (stage I)  ER+/PR+/Her2Neu-,  Ki-67 11%  PRIOR THERAPY: #1Patient underwent a screening mammogram in June 2014 was found to have an abnormality in the right breast. She had a biopsy of this lesion performed on 05/27/2013. The pathology revealed atypical cells suspicious for carcinoma. On 06/10/2013 patient had a repeat biopsy performed and that did confirm invasive lobular carcinoma that was ER positive PR positive HER-2/neu negative with a low proliferation marker Ki-67. She was seen by Dr. Neldon Mc and had a right breast lumpectomy and sentinel lymph node biopsy on 08/01/2013. The final pathology revealed a 1.8 cm invasive lobular carcinoma, grade 1 margins were cleared 2 sentinel nodes were negative for metastatic disease. Tumor was ER positive PR positive HER-2/neu negative with a proliferation marker Ki-67 11%. Pathologic stage TI C. N0 (stage I). Postoperatively she has been seen by Dr. Orlene Erm in [place] for adjuvant radiation therapy. He had an extensive discussion with her regarding the role of adjuvant radiation therapy in invasive breast cancer and potential side effects.   #2 patient is status post radiation therapy given by Dr. Elpidio Anis. She tolerated it very well.   #3 adjuvant antiestrogen therapy with Arimidex 1 mg daily starting 11/10/2013 total of 5 years curative intent  CURRENT THERAPY: arimidex 1 mg daily  INTERVAL HISTORY: Michele OUDERKIRK 78 y.o. female returns for followup visit today. She has been on arimdex 1 mg daily since December 2014. Thus far she's tolerated it well without any problems. She is denying any nausea vomiting fevers chills or night sweats. She has no vaginal bleeding or discharge.she experiencing aches or pains from her arthritis. She occasionally does  get hot flushes. She denies any peripheral paresthesias. She does have a little bit of residual tenderness in the surgical site of her right breast. Remainder of the 10 point review of systems is negative.  MEDICAL HISTORY: Past Medical History  Diagnosis Date  . Anemia   . Arthritis   . Cancer   . GERD (gastroesophageal reflux disease)   . Anxiety   . Depression   . Breast cancer, right breast 07/04/2013    ILC, 1.8 cm, neg sln receptor+, surgery on 08/01/13     ALLERGIES:  is allergic to ciprofloxacin and codeine.  MEDICATIONS:  Current Outpatient Prescriptions  Medication Sig Dispense Refill  . ALPRAZolam (XANAX) 0.25 MG tablet Take 0.25 mg by mouth 2 (two) times daily.      Marland Kitchen anastrozole (ARIMIDEX) 1 MG tablet Take 1 tablet (1 mg total) by mouth daily.  90 tablet  6  . citalopram (CELEXA) 10 MG tablet Take 10 mg by mouth daily.      Marland Kitchen losartan (COZAAR) 100 MG tablet Take 100 mg by mouth daily.      . ranitidine (ZANTAC) 150 MG capsule Take 150 mg by mouth 2 (two) times daily.      Marland Kitchen alendronate (FOSAMAX) 70 MG tablet Take 1 tablet (70 mg total) by mouth once a week. Take with a full glass of water on an empty stomach.  12 tablet  6  . cholecalciferol 2000 UNITS TABS Take 1 tablet (2,000 Units total) by mouth daily.  30 tablet  6   No current facility-administered medications for this visit.    SURGICAL HISTORY:  Past Surgical History  Procedure Laterality Date  . Ovary surgery      removed ovaries  . Salpingoophorectomy    . Appendectomy  1966  . Tubal ligation    . Breast lumpectomy with needle localization and axillary sentinel lymph node bx Right 08/01/2013    Procedure: BREAST LUMPECTOMY WITH NEEDLE LOCALIZATION AND AXILLARY SENTINEL LYMPH NODE BIOPSY;  Surgeon: Haywood Lasso, MD;  Location: [facility];  Service: General;  Laterality: Right;    REVIEW OF SYSTEMS:  Pertinent items are noted in HPI.     PHYSICAL EXAMINATION: Blood pressure 142/82,  pulse 69, temperature 98 F (36.7 C), temperature source Oral, resp. rate 18, height 5' 3"  (1.6 m), weight 220 lb 4.8 oz (99.927 kg). Body mass index is 39.03 kg/(m^2). ECOG PERFORMANCE STATUS: 1 - Symptomatic but completely ambulatory  Well-developed nourished female in no acute distress HEENT exam: EOMI PERRLA sclerae anicteric no conjunctival pallor oral mucosa is moist neck supple no palpable cervical supraclavicular or axillary adenopathy Lungs: Clear to auscultation and percussion Cardiovascular: Regular rate rhythm no murmurs gallops or rubs Abdomen: Soft nontender nondistended bowel sounds are present no hepatosplenomegaly or palpable masses Extremities: No edema clubbing or cyanosis, pedal pulses are present Neuro: Alert oriented x3 DTRs +4 strength is symmetrical in upper and lower extremities, gait normal, but is grossly normal Skin: Warm and moist, good capillary filling, no rashes. Breasts: right breast normal without mass, skin or nipple changes or axillary nodes with well healed surgical scar, left breast normal without mass, skin or nipple changes or axillary nodes.   LABORATORY DATA: Lab Results  Component Value Date   WBC 8.4 02/09/2014   HGB 12.4 02/09/2014   HCT 38.0 02/09/2014   MCV 92.5 02/09/2014   PLT 194 02/09/2014      Chemistry      Component Value Date/Time   NA 141 02/09/2014 1525   NA 139 07/29/2013 0930   K 4.5 02/09/2014 1525   K 4.0 07/29/2013 0930   CL 103 07/29/2013 0930   CO2 27 02/09/2014 1525   CO2 25 07/29/2013 0930   BUN 29.0* 02/09/2014 1525   BUN 30* 07/29/2013 0930   CREATININE 0.8 02/09/2014 1525   CREATININE 0.69 07/29/2013 0930      Component Value Date/Time   CALCIUM 9.7 02/09/2014 1525   CALCIUM 9.7 07/29/2013 0930   ALKPHOS 89 02/09/2014 1525   ALKPHOS 85 07/29/2013 0930   AST 13 02/09/2014 1525   AST 14 07/29/2013 0930   ALT 13 02/09/2014 1525   ALT 11 07/29/2013 0930   BILITOT 0.33 02/09/2014 1525   BILITOT 0.2* 07/29/2013 0930       RADIOGRAPHIC  STUDIES:    ASSESSMENT/PLAN: 78 year old female with   #1 stage I (T2N0) of invasive lobular carcinoma of the right breast status post lumpectomy with sentinel lymph node biopsy on 08/01/2013. Her final pathology revealed a 1.8 cm invasive lobular carcinoma ER positive PR positive HER-2/neu negative with a proliferation marker Ki-67 11%. 3 sentinel nodes were negative for metastatic disease.   #2 status post radiation therapy by Dr. Orlene Erm   #3 currently on  Arimidex 1 mg daily adjuvantly with curative intent. Risks benefits and side effects were explained to the patient. For 5 years of therapy is planned. She is tolerating it very well. Patient has no evidence of recurrent disease  #4. Bone density scan : performed on 11/11/13 shows low bone mass, she is counseled regarding use of calcium, vitamin D. We also discussed  use of fosamax. Discussed side effects including ONJ. Literature was given. A prescription to her pharmacy was sent.  5. Follow up: she will see [place] back in 6 months with cbc, cmet.  All questions were answered. The patient knows to call the clinic with any problems, questions or concerns. We can certainly see the patient much sooner if necessary.  I spent 15 minutes counseling the patient face to face. The total time spent in the appointment was 25 minutes.    Marcy Panning, MD Medical/Oncology [facility] [phone] (beeper) [phone] (Office)

## 2014-08-13 ENCOUNTER — Telehealth: Payer: Self-pay | Admitting: Adult Health

## 2014-08-13 NOTE — Telephone Encounter (Signed)
, °

## 2014-08-19 ENCOUNTER — Ambulatory Visit: Payer: Medicare Other | Admitting: Oncology

## 2014-08-19 ENCOUNTER — Other Ambulatory Visit: Payer: Medicare Other

## 2014-08-25 ENCOUNTER — Ambulatory Visit: Payer: Medicare Other | Admitting: Adult Health

## 2014-08-25 ENCOUNTER — Telehealth: Payer: Self-pay | Admitting: Adult Health

## 2014-08-25 ENCOUNTER — Ambulatory Visit (HOSPITAL_BASED_OUTPATIENT_CLINIC_OR_DEPARTMENT_OTHER): Payer: Medicare Other | Admitting: Adult Health

## 2014-08-25 ENCOUNTER — Other Ambulatory Visit: Payer: Medicare Other

## 2014-08-25 ENCOUNTER — Encounter: Payer: Self-pay | Admitting: Adult Health

## 2014-08-25 ENCOUNTER — Other Ambulatory Visit (HOSPITAL_BASED_OUTPATIENT_CLINIC_OR_DEPARTMENT_OTHER): Payer: Medicare Other

## 2014-08-25 VITALS — BP 164/81 | HR 79 | Temp 98.3°F | Resp 18 | Ht 63.0 in | Wt 219.0 lb

## 2014-08-25 DIAGNOSIS — Z79810 Long term (current) use of selective estrogen receptor modulators (SERMs): Secondary | ICD-10-CM

## 2014-08-25 DIAGNOSIS — C50511 Malignant neoplasm of lower-outer quadrant of right female breast: Secondary | ICD-10-CM

## 2014-08-25 DIAGNOSIS — Z853 Personal history of malignant neoplasm of breast: Secondary | ICD-10-CM

## 2014-08-25 DIAGNOSIS — M899 Disorder of bone, unspecified: Secondary | ICD-10-CM

## 2014-08-25 DIAGNOSIS — C50519 Malignant neoplasm of lower-outer quadrant of unspecified female breast: Secondary | ICD-10-CM

## 2014-08-25 DIAGNOSIS — M949 Disorder of cartilage, unspecified: Secondary | ICD-10-CM

## 2014-08-25 DIAGNOSIS — E559 Vitamin D deficiency, unspecified: Secondary | ICD-10-CM

## 2014-08-25 LAB — CBC WITH DIFFERENTIAL/PLATELET
BASO%: 0.2 % (ref 0.0–2.0)
Basophils Absolute: 0 10*3/uL (ref 0.0–0.1)
EOS%: 0.8 % (ref 0.0–7.0)
Eosinophils Absolute: 0.1 10*3/uL (ref 0.0–0.5)
HEMATOCRIT: 38.4 % (ref 34.8–46.6)
HEMOGLOBIN: 12.5 g/dL (ref 11.6–15.9)
LYMPH#: 2.3 10*3/uL (ref 0.9–3.3)
LYMPH%: 21.1 % (ref 14.0–49.7)
MCH: 30.5 pg (ref 25.1–34.0)
MCHC: 32.6 g/dL (ref 31.5–36.0)
MCV: 93.7 fL (ref 79.5–101.0)
MONO#: 0.8 10*3/uL (ref 0.1–0.9)
MONO%: 7 % (ref 0.0–14.0)
NEUT#: 7.8 10*3/uL — ABNORMAL HIGH (ref 1.5–6.5)
NEUT%: 70.9 % (ref 38.4–76.8)
Platelets: 215 10*3/uL (ref 145–400)
RBC: 4.1 10*6/uL (ref 3.70–5.45)
RDW: 13.4 % (ref 11.2–14.5)
WBC: 11 10*3/uL — AB (ref 3.9–10.3)

## 2014-08-25 LAB — COMPREHENSIVE METABOLIC PANEL (CC13)
ALBUMIN: 3.2 g/dL — AB (ref 3.5–5.0)
ALT: 11 U/L (ref 0–55)
ANION GAP: 7 meq/L (ref 3–11)
AST: 12 U/L (ref 5–34)
Alkaline Phosphatase: 114 U/L (ref 40–150)
BILIRUBIN TOTAL: 0.6 mg/dL (ref 0.20–1.20)
BUN: 20.4 mg/dL (ref 7.0–26.0)
CO2: 28 meq/L (ref 22–29)
Calcium: 9.7 mg/dL (ref 8.4–10.4)
Chloride: 103 mEq/L (ref 98–109)
Creatinine: 0.8 mg/dL (ref 0.6–1.1)
GLUCOSE: 177 mg/dL — AB (ref 70–140)
Potassium: 4 mEq/L (ref 3.5–5.1)
SODIUM: 138 meq/L (ref 136–145)
TOTAL PROTEIN: 7.6 g/dL (ref 6.4–8.3)

## 2014-08-25 NOTE — Patient Instructions (Signed)
You are doing well.  You have no sign of recurrence.  I recommend healthy diet, exercise, and monthly breast exams.  Calcium, Vitamin D supplementation, in addition to weight bearing exercise is recommended.  Breast Self-Awareness Practicing breast self-awareness may pick up problems early, prevent significant medical complications, and possibly save your life. By practicing breast self-awareness, you can become familiar with how your breasts look and feel and if your breasts are changing. This allows you to notice changes early. It can also offer you some reassurance that your breast health is good. One way to learn what is normal for your breasts and whether your breasts are changing is to do a breast self-exam. If you find a lump or something that was not present in the past, it is best to contact your caregiver right away. Other findings that should be evaluated by your caregiver include nipple discharge, especially if it is bloody; skin changes or reddening; areas where the skin seems to be pulled in (retracted); or new lumps and bumps. Breast pain is seldom associated with cancer (malignancy), but should also be evaluated by a caregiver. HOW TO PERFORM A BREAST SELF-EXAM The best time to examine your breasts is 5-7 days after your menstrual period is over. During menstruation, the breasts are lumpier, and it may be more difficult to pick up changes. If you do not menstruate, have reached menopause, or had your uterus removed (hysterectomy), you should examine your breasts at regular intervals, such as monthly. If you are breastfeeding, examine your breasts after a feeding or after using a breast pump. Breast implants do not decrease the risk for lumps or tumors, so continue to perform breast self-exams as recommended. Talk to your caregiver about how to determine the difference between the implant and breast tissue. Also, talk about the amount of pressure you should use during the exam. Over time, you  will become more familiar with the variations of your breasts and more comfortable with the exam. A breast self-exam requires you to remove all your clothes above the waist. 1. Look at your breasts and nipples. Stand in front of a mirror in a room with good lighting. With your hands on your hips, push your hands firmly downward. Look for a difference in shape, contour, and size from one breast to the other (asymmetry). Asymmetry includes puckers, dips, or bumps. Also, look for skin changes, such as reddened or scaly areas on the breasts. Look for nipple changes, such as discharge, dimpling, repositioning, or redness. 2. Carefully feel your breasts. This is best done either in the shower or tub while using soapy water or when flat on your back. Place the arm (on the side of the breast you are examining) above your head. Use the pads (not the fingertips) of your three middle fingers on your opposite hand to feel your breasts. Start in the underarm area and use  inch (2 cm) overlapping circles to feel your breast. Use 3 different levels of pressure (light, medium, and firm pressure) at each circle before moving to the next circle. The light pressure is needed to feel the tissue closest to the skin. The medium pressure will help to feel breast tissue a little deeper, while the firm pressure is needed to feel the tissue close to the ribs. Continue the overlapping circles, moving downward over the breast until you feel your ribs below your breast. Then, move one finger-width towards the center of the body. Continue to use the  inch (2 cm) overlapping  circles to feel your breast as you move slowly up toward the collar bone (clavicle) near the base of the neck. Continue the up and down exam using all 3 pressures until you reach the middle of the chest. Do this with each breast, carefully feeling for lumps or changes. 3.  Keep a written record with breast changes or normal findings for each breast. By writing this  information down, you do not need to depend only on memory for size, tenderness, or location. Write down where you are in your menstrual cycle, if you are still menstruating. Breast tissue can have some lumps or thick tissue. However, see your caregiver if you find anything that concerns you.  SEEK MEDICAL CARE IF:  You see a change in shape, contour, or size of your breasts or nipples.   You see skin changes, such as reddened or scaly areas on the breasts or nipples.   You have an unusual discharge from your nipples.   You feel a new lump or unusually thick areas.  Document Released: 11/13/2005 Document Revised: 10/30/2012 Document Reviewed: 02/28/2012 [facility] Patient Information 2015 [place], [place]. This information is not intended to replace advice given to you by your health care provider. Make sure you discuss any questions you have with your health care provider.

## 2014-08-25 NOTE — Progress Notes (Signed)
OFFICE PROGRESS NOTE  CC  VYAS,DHRUV B., MD [place] [address]  DIAGNOSIS: 78 year old female with diagnosis of right breast cancer s/p lumpectomy in September 2014  STAGE:  T1N0 (stage I)  ER+/PR+/Her2Neu-,  Ki-67 11%  PRIOR THERAPY: #1Patient underwent a screening mammogram in June 2014 was found to have an abnormality in the right breast. She had a biopsy of this lesion performed on 05/27/2013. The pathology revealed atypical cells suspicious for carcinoma. On 06/10/2013 patient had a repeat biopsy performed and that did confirm invasive lobular carcinoma that was ER positive PR positive HER-2/neu negative with a low proliferation marker Ki-67. She was seen by Dr. Neldon Mc and had a right breast lumpectomy and sentinel lymph node biopsy on 08/01/2013. The final pathology revealed a 1.8 cm invasive lobular carcinoma, grade 1 margins were cleared 2 sentinel nodes were negative for metastatic disease. Tumor was ER positive PR positive HER-2/neu negative with a proliferation marker Ki-67 11%. Pathologic stage TI C. N0 (stage I). Postoperatively she has been seen by Dr. Orlene Erm in [place] for adjuvant radiation therapy. He had an extensive discussion with her regarding the role of adjuvant radiation therapy in invasive breast cancer and potential side effects.   #2 patient is status post radiation therapy given by Dr. Elpidio Anis. She tolerated it very well.   #3 adjuvant antiestrogen therapy with Arimidex 1 mg daily starting 11/10/2013 total of 5 years curative intent  CURRENT THERAPY: arimidex 1 mg daily  INTERVAL HISTORY: Michele Palmer 78 y.o. female returns for followup visit today. She has been on arimdex 1 mg daily since December 2014. She is tolerating this medication well.  She does wonder if she has weight gain due to this.  She denies hot flashes, joint aches, or any other concerns.  A 10 point ROS is neg. We updated her health maintenance below.  MEDICAL  HISTORY: Past Medical History  Diagnosis Date  . Anemia   . Arthritis   . Cancer   . GERD (gastroesophageal reflux disease)   . Anxiety   . Depression   . Breast cancer, right breast 07/04/2013    ILC, 1.8 cm, neg sln receptor+, surgery on 08/01/13     ALLERGIES:  is allergic to ciprofloxacin and codeine.  MEDICATIONS:  Current Outpatient Prescriptions  Medication Sig Dispense Refill  . anastrozole (ARIMIDEX) 1 MG tablet Take 1 tablet (1 mg total) by mouth daily.  90 tablet  6  . cholecalciferol 2000 UNITS TABS Take 1 tablet (2,000 Units total) by mouth daily.  30 tablet  6  . losartan (COZAAR) 100 MG tablet Take 100 mg by mouth daily.      Marland Kitchen alendronate (FOSAMAX) 70 MG tablet Take 1 tablet (70 mg total) by mouth once a week. Take with a full glass of water on an empty stomach.  12 tablet  6  . ALPRAZolam (XANAX) 0.25 MG tablet Take 0.25 mg by mouth 2 (two) times daily.      . ranitidine (ZANTAC) 150 MG capsule Take 150 mg by mouth 2 (two) times daily.       No current facility-administered medications for this visit.    SURGICAL HISTORY:  Past Surgical History  Procedure Laterality Date  . Ovary surgery      removed ovaries  . Salpingoophorectomy    . Appendectomy  1966  . Tubal ligation    . Breast lumpectomy with needle localization and axillary sentinel lymph node bx Right 08/01/2013  Procedure: BREAST LUMPECTOMY WITH NEEDLE LOCALIZATION AND AXILLARY SENTINEL LYMPH NODE BIOPSY;  Surgeon: Haywood Lasso, MD;  Location: [facility];  Service: General;  Laterality: Right;    REVIEW OF SYSTEMS:  A 10 point review of systems was conducted and is otherwise negative except for what is noted above.     Health Maintenance  Mammogram: 05/2014 in [place] Colonoscopy: 2013, no f/u recommended Bone Density Scan: 11/11/2013 Pap Smear: 2014 Eye Exam: due Vitamin D Level: drawn today Lipid Panel: 2015  PHYSICAL EXAMINATION: Blood pressure 164/81, pulse 79,  temperature 98.3 F (36.8 C), temperature source Oral, resp. rate 18, height 5' 3"  (1.6 m), weight 219 lb (99.338 kg). Body mass index is 38.8 kg/(m^2). ECOG PERFORMANCE STATUS: 1 - Symptomatic but completely ambulatory GENERAL: Patient is a well appearing female in no acute distress HEENT:  Sclerae anicteric.  Oropharynx clear and moist. No ulcerations or evidence of oropharyngeal candidiasis. Neck is supple.  NODES:  No cervical, supraclavicular, or axillary lymphadenopathy palpated.  BREAST EXAM: right breast s/p lumpectomy, no nodules or masses, left breast without nodules or masses, benign bilateral breast exam. LUNGS:  Clear to auscultation bilaterally.  No wheezes or rhonchi. HEART:  Regular rate and rhythm. No murmur appreciated. ABDOMEN:  Soft, nontender.  Positive, normoactive bowel sounds. No organomegaly palpated. MSK:  No focal spinal tenderness to palpation. Full range of motion bilaterally in the upper extremities. EXTREMITIES:  No peripheral edema.   SKIN:  Clear with no obvious rashes or skin changes. No nail dyscrasia. NEURO:  Nonfocal. Well oriented.  Appropriate affect.     LABORATORY DATA: Lab Results  Component Value Date   WBC 11.0* 08/25/2014   HGB 12.5 08/25/2014   HCT 38.4 08/25/2014   MCV 93.7 08/25/2014   PLT 215 08/25/2014      Chemistry      Component Value Date/Time   NA 138 08/25/2014 1341   NA 139 07/29/2013 0930   K 4.0 08/25/2014 1341   K 4.0 07/29/2013 0930   CL 103 07/29/2013 0930   CO2 28 08/25/2014 1341   CO2 25 07/29/2013 0930   BUN 20.4 08/25/2014 1341   BUN 30* 07/29/2013 0930   CREATININE 0.8 08/25/2014 1341   CREATININE 0.69 07/29/2013 0930      Component Value Date/Time   CALCIUM 9.7 08/25/2014 1341   CALCIUM 9.7 07/29/2013 0930   ALKPHOS 114 08/25/2014 1341   ALKPHOS 85 07/29/2013 0930   AST 12 08/25/2014 1341   AST 14 07/29/2013 0930   ALT 11 08/25/2014 1341   ALT 11 07/29/2013 0930   BILITOT 0.60 08/25/2014 1341   BILITOT 0.2* 07/29/2013 0930        RADIOGRAPHIC STUDIES:    ASSESSMENT/PLAN: 78 year old female with   #1 stage I (T2N0) of invasive lobular carcinoma of the right breast status post lumpectomy with sentinel lymph node biopsy on 08/01/2013. Her final pathology revealed a 1.8 cm invasive lobular carcinoma ER positive PR positive HER-2/neu negative with a proliferation marker Ki-67 11%. 3 sentinel nodes were negative for metastatic disease.   #2 status post radiation therapy by Dr. Orlene Erm   #3 currently on  Arimidex 1 mg daily adjuvantly with curative intent. Risks benefits and side effects were explained to the patient. For 5 years of therapy is planned.  Patient is tolerating this well and will continue this.    4.  Osteopenia.  Dr. Humphrey Rolls reviewed these results with her at her last f/u appointment and prescribed Fosamax.  I reviewed this with the patient who does not recall that.  I counseled her on calcium, vitamin d intake, and weight bearing exercises.  Should her bone density be decreased next year, I recommended she then start on bisphosphanate therapy.    5. Follow up: she will see [place] back in 6 months with cbc, cmet.  We discussed survivorshiop.  I recommended healthy diet, exercise and monthly breast exams.    All questions were answered. The patient knows to call the clinic with any problems, questions or concerns. We can certainly see the patient much sooner if necessary.  I spent 25 minutes counseling the patient face to face. The total time spent in the appointment was 25 minutes.   Minette Headland, [place] [phone]

## 2014-08-25 NOTE — Telephone Encounter (Signed)
per pof to sch pt appt-sch & gave pt copy of sch °

## 2014-08-26 LAB — VITAMIN D 25 HYDROXY (VIT D DEFICIENCY, FRACTURES): VIT D 25 HYDROXY: 37 ng/mL (ref 30–89)

## 2015-01-15 ENCOUNTER — Inpatient Hospital Stay (HOSPITAL_COMMUNITY)
Admission: AD | Admit: 2015-01-15 | Discharge: 2015-02-05 | DRG: 329 | Disposition: A | Discharge status: Home Health | Payer: Medicare Other | Source: Other Acute Inpatient Hospital | Attending: Surgery | Admitting: Surgery

## 2015-01-15 DIAGNOSIS — E860 Dehydration: Secondary | ICD-10-CM | POA: Diagnosis present

## 2015-01-15 DIAGNOSIS — Z853 Personal history of malignant neoplasm of breast: Secondary | ICD-10-CM

## 2015-01-15 DIAGNOSIS — N3289 Other specified disorders of bladder: Secondary | ICD-10-CM | POA: Diagnosis not present

## 2015-01-15 DIAGNOSIS — F411 Generalized anxiety disorder: Secondary | ICD-10-CM | POA: Diagnosis present

## 2015-01-15 DIAGNOSIS — E119 Type 2 diabetes mellitus without complications: Secondary | ICD-10-CM | POA: Diagnosis present

## 2015-01-15 DIAGNOSIS — F329 Major depressive disorder, single episode, unspecified: Secondary | ICD-10-CM | POA: Diagnosis present

## 2015-01-15 DIAGNOSIS — R7309 Other abnormal glucose: Secondary | ICD-10-CM | POA: Diagnosis not present

## 2015-01-15 DIAGNOSIS — D72829 Elevated white blood cell count, unspecified: Secondary | ICD-10-CM

## 2015-01-15 DIAGNOSIS — K5720 Diverticulitis of large intestine with perforation and abscess without bleeding: Principal | ICD-10-CM

## 2015-01-15 DIAGNOSIS — R109 Unspecified abdominal pain: Secondary | ICD-10-CM | POA: Diagnosis present

## 2015-01-15 DIAGNOSIS — Z79899 Other long term (current) drug therapy: Secondary | ICD-10-CM

## 2015-01-15 DIAGNOSIS — Z87440 Personal history of urinary (tract) infections: Secondary | ICD-10-CM | POA: Diagnosis not present

## 2015-01-15 DIAGNOSIS — I10 Essential (primary) hypertension: Secondary | ICD-10-CM | POA: Diagnosis present

## 2015-01-15 DIAGNOSIS — R5381 Other malaise: Secondary | ICD-10-CM | POA: Diagnosis not present

## 2015-01-15 DIAGNOSIS — N321 Vesicointestinal fistula: Secondary | ICD-10-CM | POA: Diagnosis present

## 2015-01-15 DIAGNOSIS — K651 Peritoneal abscess: Secondary | ICD-10-CM

## 2015-01-15 DIAGNOSIS — T83098A Other mechanical complication of other indwelling urethral catheter, initial encounter: Secondary | ICD-10-CM | POA: Diagnosis not present

## 2015-01-15 DIAGNOSIS — IMO0002 Reserved for concepts with insufficient information to code with codable children: Secondary | ICD-10-CM

## 2015-01-15 DIAGNOSIS — N390 Urinary tract infection, site not specified: Secondary | ICD-10-CM | POA: Diagnosis not present

## 2015-01-15 DIAGNOSIS — Y846 Urinary catheterization as the cause of abnormal reaction of the patient, or of later complication, without mention of misadventure at the time of the procedure: Secondary | ICD-10-CM | POA: Diagnosis not present

## 2015-01-15 DIAGNOSIS — E876 Hypokalemia: Secondary | ICD-10-CM | POA: Diagnosis present

## 2015-01-15 DIAGNOSIS — R32 Unspecified urinary incontinence: Secondary | ICD-10-CM | POA: Diagnosis present

## 2015-01-15 DIAGNOSIS — K219 Gastro-esophageal reflux disease without esophagitis: Secondary | ICD-10-CM | POA: Diagnosis not present

## 2015-01-15 DIAGNOSIS — F419 Anxiety disorder, unspecified: Secondary | ICD-10-CM | POA: Diagnosis present

## 2015-01-15 DIAGNOSIS — E43 Unspecified severe protein-calorie malnutrition: Secondary | ICD-10-CM | POA: Diagnosis present

## 2015-01-15 DIAGNOSIS — K630 Abscess of intestine: Secondary | ICD-10-CM | POA: Diagnosis present

## 2015-01-15 DIAGNOSIS — F32A Depression, unspecified: Secondary | ICD-10-CM | POA: Diagnosis present

## 2015-01-15 HISTORY — DX: Essential (primary) hypertension: I10

## 2015-01-15 LAB — CBC
HEMATOCRIT: 31.2 % — AB (ref 36.0–46.0)
HEMOGLOBIN: 9.9 g/dL — AB (ref 12.0–15.0)
MCH: 28.7 pg (ref 26.0–34.0)
MCHC: 31.7 g/dL (ref 30.0–36.0)
MCV: 90.4 fL (ref 78.0–100.0)
PLATELETS: 323 10*3/uL (ref 150–400)
RBC: 3.45 MIL/uL — AB (ref 3.87–5.11)
RDW: 14.4 % (ref 11.5–15.5)
WBC: 11.9 10*3/uL — AB (ref 4.0–10.5)

## 2015-01-15 LAB — COMPREHENSIVE METABOLIC PANEL
ALBUMIN: 2.1 g/dL — AB (ref 3.5–5.2)
ALK PHOS: 84 U/L (ref 39–117)
ALT: 7 U/L (ref 0–35)
AST: 11 U/L (ref 0–37)
Anion gap: 6 (ref 5–15)
BILIRUBIN TOTAL: 0.5 mg/dL (ref 0.3–1.2)
BUN: 9 mg/dL (ref 6–23)
CO2: 29 mmol/L (ref 19–32)
Calcium: 8.6 mg/dL (ref 8.4–10.5)
Chloride: 100 mmol/L (ref 96–112)
Creatinine, Ser: 0.65 mg/dL (ref 0.50–1.10)
GFR calc Af Amer: >90 mL/min (ref >90)
GFR calc non Af Amer: 83 mL/min — ABNORMAL LOW (ref >90)
GLUCOSE: 112 mg/dL — AB (ref 70–99)
POTASSIUM: 3.7 mmol/L (ref 3.5–5.1)
Sodium: 135 mmol/L (ref 135–145)
Total Protein: 6.6 g/dL (ref 6.0–8.3)

## 2015-01-15 LAB — URINE MICROSCOPIC-ADD ON

## 2015-01-15 LAB — URINALYSIS, ROUTINE W REFLEX MICROSCOPIC
Glucose, UA: NEGATIVE mg/dL
Ketones, ur: 15 mg/dL — AB
NITRITE: POSITIVE — AB
PH: 8 (ref 5.0–8.0)
Protein, ur: 100 mg/dL — AB
SPECIFIC GRAVITY, URINE: 1.025 (ref 1.005–1.030)
UROBILINOGEN UA: 4 mg/dL — AB (ref 0.0–1.0)

## 2015-01-15 LAB — MAGNESIUM: MAGNESIUM: 1.9 mg/dL (ref 1.5–2.5)

## 2015-01-15 MED ORDER — ONDANSETRON HCL 4 MG/2ML IJ SOLN
4.0000 mg | Freq: Four times a day (QID) | INTRAMUSCULAR | Status: DC | PRN
  Administered 2015-01-20 – 2015-02-04 (×8): 4 mg via INTRAVENOUS
  Filled 2015-01-15 (×11): qty 2

## 2015-01-15 MED ORDER — ALPRAZOLAM 0.25 MG PO TABS
0.2500 mg | ORAL_TABLET | Freq: Two times a day (BID) | ORAL | Status: DC
  Administered 2015-01-16 – 2015-01-30 (×22): 0.25 mg via ORAL
  Filled 2015-01-15 (×27): qty 1

## 2015-01-15 MED ORDER — ENOXAPARIN SODIUM 40 MG/0.4ML ~~LOC~~ SOLN
40.0000 mg | SUBCUTANEOUS | Status: DC
  Administered 2015-01-15 – 2015-01-31 (×16): 40 mg via SUBCUTANEOUS
  Filled 2015-01-15 (×18): qty 0.4

## 2015-01-15 MED ORDER — LEVALBUTEROL HCL 0.63 MG/3ML IN NEBU: 0.6300 mg | INHALATION_SOLUTION | Freq: Four times a day (QID) | RESPIRATORY_TRACT | Status: DC | PRN

## 2015-01-15 MED ORDER — POTASSIUM CHLORIDE IN NACL 20-0.9 MEQ/L-% IV SOLN
INTRAVENOUS | Status: AC
  Administered 2015-01-15 – 2015-01-17 (×3): via INTRAVENOUS
  Filled 2015-01-15 (×6): qty 1000

## 2015-01-15 MED ORDER — SODIUM CHLORIDE 0.9 % IJ SOLN
3.0000 mL | Freq: Two times a day (BID) | INTRAMUSCULAR | Status: DC
  Administered 2015-01-15 – 2015-02-04 (×18): 3 mL via INTRAVENOUS

## 2015-01-15 MED ORDER — HYDROMORPHONE HCL 1 MG/ML IJ SOLN
INTRAMUSCULAR | Status: AC
  Administered 2015-01-15: 1 mg via INTRAVENOUS
  Filled 2015-01-15: qty 1

## 2015-01-15 MED ORDER — ACETAMINOPHEN 325 MG PO TABS
650.0000 mg | ORAL_TABLET | Freq: Four times a day (QID) | ORAL | Status: DC | PRN
  Administered 2015-01-18 – 2015-02-04 (×12): 650 mg via ORAL
  Filled 2015-01-15 (×12): qty 2

## 2015-01-15 MED ORDER — CETYLPYRIDINIUM CHLORIDE 0.05 % MT LIQD
7.0000 mL | Freq: Two times a day (BID) | OROMUCOSAL | Status: DC
  Administered 2015-01-16 – 2015-01-29 (×14): 7 mL via OROMUCOSAL

## 2015-01-15 MED ORDER — HYDROMORPHONE HCL 1 MG/ML IJ SOLN
1.0000 mg | INTRAMUSCULAR | Status: DC | PRN
  Administered 2015-01-15 – 2015-01-25 (×22): 1 mg via INTRAVENOUS
  Filled 2015-01-15 (×24): qty 1

## 2015-01-15 MED ORDER — PIPERACILLIN-TAZOBACTAM 3.375 G IVPB 30 MIN
3.3750 g | Freq: Once | INTRAVENOUS | Status: AC
  Administered 2015-01-15: 3.375 g via INTRAVENOUS
  Filled 2015-01-15: qty 50

## 2015-01-15 MED ORDER — ACETAMINOPHEN 650 MG RE SUPP: 650.0000 mg | Freq: Four times a day (QID) | RECTAL | Status: DC | PRN

## 2015-01-15 MED ORDER — ONDANSETRON HCL 4 MG PO TABS
4.0000 mg | ORAL_TABLET | Freq: Four times a day (QID) | ORAL | Status: DC | PRN
  Administered 2015-01-28 – 2015-02-02 (×2): 4 mg via ORAL
  Filled 2015-01-15 (×2): qty 1

## 2015-01-15 MED ORDER — CHLORHEXIDINE GLUCONATE 0.12 % MT SOLN
15.0000 mL | Freq: Two times a day (BID) | OROMUCOSAL | Status: DC
  Administered 2015-01-15 – 2015-01-31 (×26): 15 mL via OROMUCOSAL
  Filled 2015-01-15 (×34): qty 15

## 2015-01-15 NOTE — H&P (Addendum)
[facility] History and Physical  Michele Palmer IRC:[id number] DOB: [birth year] DOA: 01/15/2015  Referring physician:  PCP: Glenda Chroman., MD   Chief Complaint: Abdominal pain HPI:  79 year old female with a history of recurrent UTI, hypertension, diet-controlled diabetes presents in transfer from [place] where she was found to have an intra-abdominal abscess. The patient has been ill for the last 1 month and have been suffering from recurrent urinary tract infections since November of last year. Over the course of the last month the patient has had suprapubic abdominal pain and fecal material in the urine. She saw urologist Dr. Exie Parody who did a cystoscopy 2 days ago and ordered her to have a CT scan. 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 The patient was transferred to [facility] for further evaluation. She complains of mild nausea but no vomiting. She denies any diarrhea denies any heart. No high-grade fever. She has recently been treated with an antibiotic for 14 days but does not remember the name of this antibiotic.      Review of Systems: negative for the following  Constitutional: Denies fever, chills, diaphoresis, appetite change and fatigue.  HEENT: Denies photophobia, eye pain, redness, hearing loss, ear pain, congestion, sore throat, rhinorrhea, sneezing, mouth sores, trouble swallowing, neck pain, neck stiffness and tinnitus.  Respiratory: Denies SOB, DOE, cough, chest tightness, and wheezing.  Cardiovascular: Denies chest pain, palpitations and leg swelling.  Gastrointestinal: Positive for nausea, vomiting, positive for abdominal pain, diarrhea, constipation, blood in stool and abdominal distention.  Genitourinary: Denies dysuria, urgency, frequency, hematuria, flank pain and difficulty urinating.  Musculoskeletal: Denies myalgias, back pain, joint swelling, arthralgias and gait problem.  Skin: Denies  pallor, rash and wound.  Neurological: Denies dizziness, seizures, syncope, weakness, light-headedness, numbness and headaches.  Hematological: Denies adenopathy. Easy bruising, personal or family bleeding history  Psychiatric/Behavioral: Denies suicidal ideation, mood changes, confusion, nervousness, sleep disturbance and agitation       Past Medical History  Diagnosis Date  . Anemia   . Arthritis   . Cancer   . GERD (gastroesophageal reflux disease)   . Anxiety   . Depression   . Breast cancer, right breast 07/04/2013    ILC, 1.8 cm, neg sln receptor+, surgery on 08/01/13      Past Surgical History  Procedure Laterality Date  . Ovary surgery      removed ovaries  . Salpingoophorectomy    . Appendectomy  1966  . Tubal ligation    . Breast lumpectomy with needle localization and axillary sentinel lymph node bx Right 08/01/2013    Procedure: BREAST LUMPECTOMY WITH NEEDLE LOCALIZATION AND AXILLARY SENTINEL LYMPH NODE BIOPSY;  Surgeon: Haywood Lasso, MD;  Location: [facility];  Service: General;  Laterality: Right;      Social History:  reports that she has never smoked. She has never used smokeless tobacco. She reports that she does not drink alcohol or use illicit drugs.    Allergies  Allergen Reactions  . Ciprofloxacin Hives  . Codeine Other (See Comments)    Headache    Family History  Problem Relation Age of Onset  . Stroke Mother   . Stroke Father   . Cancer Sister     ovarian      FAMILY HISTORY  When questioned  Directly-patient reports  No family history of HTN, CVA ,DIABETES, TB, Cancer CAD, Bleeding Disorders, Sickle Cell, diabetes, anemia, asthma,   Prior to  Admission medications   Medication Sig Start Date End Date Taking? Authorizing Provider  alendronate (FOSAMAX) 70 MG tablet Take 1 tablet (70 mg total) by mouth once a week. Take with a full glass of water on an empty stomach. 02/09/14   Deatra Robinson, MD  ALPRAZolam Duanne Moron) 0.25  MG tablet Take 0.25 mg by mouth 2 (two) times daily.    Historical Provider, MD  anastrozole (ARIMIDEX) 1 MG tablet Take 1 tablet (1 mg total) by mouth daily. 02/09/14   Deatra Robinson, MD  cholecalciferol 2000 UNITS TABS Take 1 tablet (2,000 Units total) by mouth daily. 02/09/14   Deatra Robinson, MD  losartan (COZAAR) 100 MG tablet Take 100 mg by mouth daily.    Historical Provider, MD  ranitidine (ZANTAC) 150 MG capsule Take 150 mg by mouth 2 (two) times daily.    Historical Provider, MD     Physical Exam: Filed Vitals:   01/15/15 1633  BP: 126/59  Pulse: 75  Temp: 98.3 F (36.8 C)  Resp: 18  SpO2: 98%     Constitutional: Vital signs reviewed. Patient is a well-developed and well-nourished in no acute distress and cooperative with exam. Alert and oriented x3.  Head: Normocephalic and atraumatic  Ear: TM normal bilaterally  Mouth: no erythema or exudates, MMM  Eyes: PERRL, EOMI, conjunctivae normal, No scleral icterus.  Neck: Supple, Trachea midline normal ROM, No JVD, mass, thyromegaly, or carotid bruit present.  Cardiovascular: RRR, S1 normal, S2 normal, no MRG, pulses symmetric and intact bilaterally  Pulmonary/Chest: CTAB, no wheezes, rales, or rhonchi  Abdominal: Soft. Suprapubic discomfort, non-distended, bowel sounds are normal, no masses, organomegaly, or guarding present.  GU: no CVA tenderness, Foley catheter draining dark-colored urine  Musculoskeletal: No joint deformities, erythema, or stiffness, ROM full and no nontender Ext: no edema and no cyanosis, pulses palpable bilaterally (DP and PT)  Hematology: no cervical, inginal, or axillary adenopathy.  Neurological: A&O x3, Strenght is normal and symmetric bilaterally, cranial nerve II-XII are grossly intact, no focal motor deficit, sensory intact to light touch bilaterally.  Skin: Warm, dry and intact. No rash, cyanosis, or clubbing.  Psychiatric: Normal mood and affect. speech and behavior is normal. Judgment and thought  content normal. Cognition and memory are normal.       Labs on Admission:    Basic Metabolic Panel: No results for input(s): NA, K, CL, CO2, GLUCOSE, BUN, CREATININE, CALCIUM, MG, PHOS in the last 168 hours. Liver Function Tests: No results for input(s): AST, ALT, ALKPHOS, BILITOT, PROT, ALBUMIN in the last 168 hours. No results for input(s): LIPASE, AMYLASE in the last 168 hours. No results for input(s): AMMONIA in the last 168 hours. CBC: No results for input(s): WBC, NEUTROABS, HGB, HCT, MCV, PLT in the last 168 hours. Cardiac Enzymes: No results for input(s): CKTOTAL, CKMB, CKMBINDEX, TROPONINI in the last 168 hours.  BNP (last 3 results) No results for input(s): BNP in the last 8760 hours.  ProBNP (last 3 results) No results for input(s): PROBNP in the last 8760 hours.     CBG: No results for input(s): GLUCAP in the last 168 hours.  Radiological Exams on Admission: No results found.  EKG: Independently reviewed.   Assessment/Plan Principal Problem:   Intestinal diverticular abscess Active Problems:   Essential hypertension   Hypokalemia   Sigmoid diverticulitis with abscess Measuring 9.4 cm on CT scan done in 01/14/15 Patient will be started on Zosyn I have paged general surgery Dr. Donne Hazel, patient may also  need interventional radiology evaluation to drain abscesses percutaneously We'll start the patient on Zosyn Start the patient on IV fluids as she appears quite dehydrated Obtain blood culture 2 Because of the fistula   urology consultation,  Dr. Karsten Ro has been notified  Hypokalemia/dehydration Clinically dry therefore the patient will be hydrated with IV fluids Repeat labs  Hypertension Stable we will continue to hold losartan  Diabetes diet control We'll check hemoglobin A1c and Accu-Cheks every 8 hours   Code Status:   full Family Communication: bedside Disposition Plan: admit   Time spent: 70 mins   [facility] Pager [phone]  If 7PM-7AM, please contact night-coverage [email or web address] Password [email or web address] 01/15/2015, 6:06 PM

## 2015-01-15 NOTE — Progress Notes (Signed)
NURSING PROGRESS NOTE  Michele Palmer [id number] Admission Data: 01/15/2015 4:53 PM Attending Provider: Hosie Poisson, MD [id number],TFTDD B., MD Code Status: Full  Michele Palmer is a 79 y.o. female patient admitted from ED:  -No acute distress noted.  -No complaints of shortness of breath.  -No complaints of chest pain.   Blood pressure 126/59, pulse 75, temperature 98.3 F (36.8 C), resp. rate 18, SpO2 98 %.   IV Fluids:  IV in place, occlusive dsg intact without redness, IV cath hand left and left FA, condition patent and no redness none.   Allergies:  Ciprofloxacin and Codeine  Past Medical History:   has a past medical history of Anemia; Arthritis; Cancer; GERD (gastroesophageal reflux disease); Anxiety; Depression; and Breast cancer, right breast (07/04/2013).  Past Surgical History:   has past surgical history that includes Ovary surgery; Salpingoophorectomy; Appendectomy (1966); Tubal ligation; and Breast lumpectomy with needle localization and axillary sentinel lymph node bx (Right, 08/01/2013).  Social History:   reports that she has never smoked. She has never used smokeless tobacco. She reports that she does not drink alcohol or use illicit drugs.  Skin: Intact  Patient/Family orientated to room. Information packet given to patient/family. Admission inpatient armband information verified with patient/family to include name and date of birth and placed on patient arm. Side rails up x 2, fall assessment and education completed with patient/family. Patient/family able to verbalize understanding of risk associated with falls and verbalized understanding to call for assistance before getting out of bed. Call light within reach. Patient/family able to voice and demonstrate understanding of unit orientation instructions.    Will continue to evaluate and treat per MD orders.

## 2015-01-15 NOTE — Progress Notes (Addendum)
ANTIBIOTIC CONSULT NOTE - INITIAL  Pharmacy Consult for Zosyn Indication: intra-abdominal infection  Allergies  Allergen Reactions  . Ciprofloxacin Hives  . Codeine Other (See Comments)    Headache    Patient Measurements:   Adjusted Body Weight:   Vital Signs: Temp: 98.3 F (36.8 C) (02/19 1633) BP: 126/59 mmHg (02/19 1633) Pulse Rate: 75 (02/19 1633) Intake/Output from previous day:   Intake/Output from this shift:    Labs: No results for input(s): WBC, HGB, PLT, LABCREA, CREATININE in the last 72 hours. CrCl cannot be calculated (Unknown ideal weight.). No results for input(s): VANCOTROUGH, VANCOPEAK, VANCORANDOM, GENTTROUGH, GENTPEAK, GENTRANDOM, TOBRATROUGH, TOBRAPEAK, TOBRARND, AMIKACINPEAK, AMIKACINTROU, AMIKACIN in the last 72 hours.   Microbiology: No results found for this or any previous visit (from the past 720 hour(s)).  Medical History: Past Medical History  Diagnosis Date  . Anemia   . Arthritis   . Cancer   . GERD (gastroesophageal reflux disease)   . Anxiety   . Depression   . Breast cancer, right breast 07/04/2013    ILC, 1.8 cm, neg sln receptor+, surgery on 08/01/13     Medications:  Prescriptions prior to admission  Medication Sig Dispense Refill Last Dose  . alendronate (FOSAMAX) 70 MG tablet Take 1 tablet (70 mg total) by mouth once a week. Take with a full glass of water on an empty stomach. 12 tablet 6 Not Taking  . ALPRAZolam (XANAX) 0.25 MG tablet Take 0.25 mg by mouth 2 (two) times daily.   Not Taking  . anastrozole (ARIMIDEX) 1 MG tablet Take 1 tablet (1 mg total) by mouth daily. 90 tablet 6 Taking  . cholecalciferol 2000 UNITS TABS Take 1 tablet (2,000 Units total) by mouth daily. 30 tablet 6 Taking  . losartan (COZAAR) 100 MG tablet Take 100 mg by mouth daily.   Taking  . ranitidine (ZANTAC) 150 MG capsule Take 150 mg by mouth 2 (two) times daily.   Not Taking   Scheduled:  . antiseptic oral rinse  7 mL Mouth Rinse q12n4p  .  chlorhexidine  15 mL Mouth Rinse BID  . enoxaparin (LOVENOX) injection  40 mg Subcutaneous Q24H  . piperacillin-tazobactam  3.375 g Intravenous Once  . sodium chloride  3 mL Intravenous Q12H   Infusions:  . 0.9 % NaCl with KCl 20 mEq / L     Assessment: 79yo female with history of breast cancer, anemia, and GERD presents with intra-abdominal abscess. Pharmacy is consulted to dose zosyn for intra-abdominal infection. Pt is afebrile, WBC 11.9, sCr 0.65.  Pt was given zosyn 3.375g IV once over 30 minutes.   Goal of Therapy:  Eradication of infection  Plan:  Zosyn 3.375g IV q8h Follow up culture results, renal function, and clinical course  Andrey Cota. Diona Foley, PharmD Clinical Pharmacist Pager [phone] 01/15/2015,6:07 PM

## 2015-01-15 NOTE — Consult Note (Signed)
Reason for Consult:diverticular abscess,cv fistula Referring Physician: [name] is an 79 y.o. female.  HPI: 53 yof with recurrent uti since mid January who comes from [facility] for diverticular abscess and cv fistula. She has had brown drainage with urine and pneumaturia since then.  She has not been eating well since then. She continues to have bms. Some nausea, no emesis. Has colonoscopy about 3 years ago that was fairly normal per family.  She has some lower abdominal pain also.  She has pain with urination. She underwent a ct scan that I have report and have not been able to view yet that shows a couple diverticular abscess and cv fistula.  She has been on multiple abx since started and was transferred here for evaluation   Past Medical History  Diagnosis Date  . Anemia   . Arthritis   . Cancer   . GERD (gastroesophageal reflux disease)   . Anxiety   . Depression   . Breast cancer, right breast 07/04/2013    ILC, 1.8 cm, neg sln receptor+, surgery on 08/01/13     Past Surgical History  Procedure Laterality Date  . Ovary surgery      removed ovaries  . Salpingoophorectomy    . Appendectomy  1966  . Tubal ligation    . Breast lumpectomy with needle localization and axillary sentinel lymph node bx Right 08/01/2013    Procedure: BREAST LUMPECTOMY WITH NEEDLE LOCALIZATION AND AXILLARY SENTINEL LYMPH NODE BIOPSY;  Surgeon: Haywood Lasso, MD;  Location: [facility];  Service: General;  Laterality: Right;    Family History  Problem Relation Age of Onset  . Stroke Mother   . Stroke Father   . Cancer Sister     ovarian    Social History:  reports that she has never smoked. She has never used smokeless tobacco. She reports that she does not drink alcohol or use illicit drugs.  Allergies:  Allergies  Allergen Reactions  . Aspirin Other (See Comments)    Makes me "shaky"  . Ciprofloxacin Hives  . Codeine Other (See Comments)    Headache  . Sulfur Rash     Welps     Medications: I have reviewed the patient's current medications.  Results for orders placed or performed during the hospital encounter of 01/15/15 (from the past 48 hour(s))  CBC     Status: Abnormal   Collection Time: 01/15/15  7:20 PM  Result Value Ref Range   WBC 11.9 (H) 4.0 - 10.5 K/uL   RBC 3.45 (L) 3.87 - 5.11 MIL/uL   Hemoglobin 9.9 (L) 12.0 - 15.0 g/dL   HCT 31.2 (L) 36.0 - 46.0 %   MCV 90.4 78.0 - 100.0 fL   MCH 28.7 26.0 - 34.0 pg   MCHC 31.7 30.0 - 36.0 g/dL   RDW 14.4 11.5 - 15.5 %   Platelets 323 150 - 400 K/uL    No results found.  Review of Systems  Constitutional: Positive for weight loss and malaise/fatigue. Negative for fever and chills.  Respiratory: Negative for shortness of breath.   Cardiovascular: Negative for chest pain.  Gastrointestinal: Positive for nausea and abdominal pain. Negative for vomiting, diarrhea, constipation and blood in stool.  Genitourinary: Positive for dysuria and frequency.   Blood pressure 126/59, pulse 75, temperature 98.3 F (36.8 C), resp. rate 18, height 5\' 3"  (1.6 m), weight 190 lb 11.2 oz (86.5 kg), SpO2 98 %. Physical Exam  Vitals reviewed. Constitutional:  She appears well-developed and well-nourished.  Eyes: No scleral icterus.  Neck: Neck supple.  Cardiovascular: Normal rate, regular rhythm and normal heart sounds.   Respiratory: Effort normal and breath sounds normal. She has no wheezes. She has no rales.  GI: Soft. There is tenderness in the left lower quadrant. There is no rigidity and no guarding. No hernia.      Assessment/Plan: Diverticular abscess and cv fistula  I think best plan would be to make npo and start abx. She eventually will require surgery but I don't think would tolerate well right now.  Will check prealbumin in am but I think a picc and tna would be good plan for medical team to start.  Will ask IR if these abscesses are amenable to percutaneous drainage.  Discussed role of surgery  and need at some point but hopefully can get her at least better with drains, npo and some nutrition first.  [facility] 01/15/2015, 7:55 PM

## 2015-01-16 ENCOUNTER — Encounter (HOSPITAL_COMMUNITY): Payer: Self-pay | Admitting: *Deleted

## 2015-01-16 ENCOUNTER — Inpatient Hospital Stay (HOSPITAL_COMMUNITY): Payer: Medicare Other

## 2015-01-16 DIAGNOSIS — I10 Essential (primary) hypertension: Secondary | ICD-10-CM

## 2015-01-16 DIAGNOSIS — K630 Abscess of intestine: Secondary | ICD-10-CM

## 2015-01-16 DIAGNOSIS — K651 Peritoneal abscess: Secondary | ICD-10-CM

## 2015-01-16 DIAGNOSIS — E876 Hypokalemia: Secondary | ICD-10-CM

## 2015-01-16 DIAGNOSIS — K5720 Diverticulitis of large intestine with perforation and abscess without bleeding: Principal | ICD-10-CM

## 2015-01-16 DIAGNOSIS — F329 Major depressive disorder, single episode, unspecified: Secondary | ICD-10-CM

## 2015-01-16 DIAGNOSIS — F411 Generalized anxiety disorder: Secondary | ICD-10-CM

## 2015-01-16 LAB — COMPREHENSIVE METABOLIC PANEL
ALK PHOS: 103 U/L (ref 39–117)
ALT: 7 U/L (ref 0–35)
AST: 18 U/L (ref 0–37)
Albumin: 2.2 g/dL — ABNORMAL LOW (ref 3.5–5.2)
Anion gap: 6 (ref 5–15)
BUN: 9 mg/dL (ref 6–23)
CALCIUM: 8.6 mg/dL (ref 8.4–10.5)
CO2: 27 mmol/L (ref 19–32)
CREATININE: 0.78 mg/dL (ref 0.50–1.10)
Chloride: 101 mmol/L (ref 96–112)
GFR calc Af Amer: >90 mL/min (ref >90)
GFR calc non Af Amer: 79 mL/min — ABNORMAL LOW (ref >90)
GLUCOSE: 107 mg/dL — AB (ref 70–99)
Potassium: 4.2 mmol/L (ref 3.5–5.1)
SODIUM: 134 mmol/L — AB (ref 135–145)
TOTAL PROTEIN: 7.1 g/dL (ref 6.0–8.3)
Total Bilirubin: 0.7 mg/dL (ref 0.3–1.2)

## 2015-01-16 LAB — GLUCOSE, CAPILLARY
Glucose-Capillary: 93 mg/dL (ref 70–99)
Glucose-Capillary: 114 mg/dL — ABNORMAL HIGH (ref 70–99)
Glucose-Capillary: 101 mg/dL — ABNORMAL HIGH (ref 70–99)
Glucose-Capillary: 93 mg/dL (ref 70–99)

## 2015-01-16 LAB — PROTIME-INR
INR: 1.13 (ref 0.00–1.49)
Prothrombin Time: 14.6 seconds (ref 11.6–15.2)

## 2015-01-16 LAB — CBC
HCT: 34.7 % — ABNORMAL LOW (ref 36.0–46.0)
Hemoglobin: 11 g/dL — ABNORMAL LOW (ref 12.0–15.0)
MCH: 28.7 pg (ref 26.0–34.0)
MCHC: 31.7 g/dL (ref 30.0–36.0)
MCV: 90.6 fL (ref 78.0–100.0)
PLATELETS: 406 10*3/uL — AB (ref 150–400)
RBC: 3.83 MIL/uL — ABNORMAL LOW (ref 3.87–5.11)
RDW: 14.4 % (ref 11.5–15.5)
WBC: 13.3 10*3/uL — AB (ref 4.0–10.5)

## 2015-01-16 MED ORDER — MIDAZOLAM HCL 2 MG/2ML IJ SOLN
INTRAMUSCULAR | Status: AC
  Filled 2015-01-16: qty 2

## 2015-01-16 MED ORDER — FENTANYL CITRATE 0.05 MG/ML IJ SOLN
INTRAMUSCULAR | Status: AC | PRN
  Administered 2015-01-16: 50 ug via INTRAVENOUS

## 2015-01-16 MED ORDER — LIDOCAINE HCL 1 % IJ SOLN
INTRAMUSCULAR | Status: AC
  Filled 2015-01-16: qty 20

## 2015-01-16 MED ORDER — FENTANYL CITRATE 0.05 MG/ML IJ SOLN
INTRAMUSCULAR | Status: AC
  Filled 2015-01-16: qty 2

## 2015-01-16 MED ORDER — HYOSCYAMINE SULFATE 0.125 MG SL SUBL
0.1250 mg | SUBLINGUAL_TABLET | SUBLINGUAL | Status: DC | PRN
  Administered 2015-01-17 – 2015-01-21 (×7): 0.125 mg via SUBLINGUAL
  Filled 2015-01-16 (×13): qty 1

## 2015-01-16 MED ORDER — METRONIDAZOLE IN NACL 5-0.79 MG/ML-% IV SOLN
500.0000 mg | Freq: Three times a day (TID) | INTRAVENOUS | Status: DC
  Administered 2015-01-16 – 2015-01-17 (×2): 500 mg via INTRAVENOUS
  Filled 2015-01-16 (×4): qty 100

## 2015-01-16 MED ORDER — MIDAZOLAM HCL 2 MG/2ML IJ SOLN
INTRAMUSCULAR | Status: AC | PRN
  Administered 2015-01-16: 1 mg via INTRAVENOUS

## 2015-01-16 MED ORDER — MIRABEGRON ER 50 MG PO TB24
50.0000 mg | ORAL_TABLET | Freq: Every day | ORAL | Status: DC
  Administered 2015-01-16 – 2015-02-05 (×20): 50 mg via ORAL
  Filled 2015-01-16 (×22): qty 1

## 2015-01-16 MED ORDER — PIPERACILLIN-TAZOBACTAM 3.375 G IVPB
3.3750 g | Freq: Three times a day (TID) | INTRAVENOUS | Status: DC
  Administered 2015-01-16 – 2015-01-17 (×5): 3.375 g via INTRAVENOUS
  Filled 2015-01-16 (×7): qty 50

## 2015-01-16 NOTE — Sedation Documentation (Signed)
Patient denies pain and is resting comfortably.  

## 2015-01-16 NOTE — Progress Notes (Signed)
[facility] PROGRESS NOTE  Michele Palmer JQZ:[id number] DOB: 11/05/1936 DOA: 01/15/2015 PCP: Glenda Chroman., MD  Assessment/Plan: Sigmoid diverticulitis with abscess -Measuring 9.4 cm on CT scan done in 01/14/15 -Continue Zosyn and metronidazole -Blood culture pending  -Abscess culture pending  -Because of the fistula urology consultation, Dr. Karsten Ro has been notified -Once blood cultures return clear patient will need PICC line and start TPN.  Hypokalemia/dehydration -Clinically dry continue normal saline 100 ml/hr   Hypertension -Stable we will continue to hold losartan  Diabetes diet control -Will check hemoglobin A1c and Accu-Cheks every 8 hours  Anxiety/depression -Continue Xanax 0.25 mg BID    Code Status: Full Family Communication: Son and wife Disposition Plan: Resolution abscess    Consultants: Dr.Mark Nedra Hai (urology) Dr.Matthew Tsuei ([facility])  Procedures: 2/20 CT GUIDED ANTERIOR PELVIC DIVERTICULAR ABSCESS DRAIN    Cultures 2/19 blood pending 2/20 abscess pending 2/20 blood pending 2/20 urine pending   Antibiotics: Zosyn 2/19>> Metronidazole 2/20>>   DVT prophylaxis Lovenox   HPI/Subjective: [name] PMHx anxiety, depression, right breast cancer 07/04/13 recurrent UTI, hypertension, diet-controlled diabetes  presents in transfer from [place] where she was found to have an intra-abdominal abscess. The patient has been ill for the last 1 month and have been suffering from recurrent urinary tract infections since November of last year. Over the course of the last month the patient has had suprapubic abdominal pain and fecal material in the urine. She saw urologist Dr. Exie Parody who did a cystoscopy 2 days ago and ordered her to have a CT scan. 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 The patient was transferred to [facility] for further evaluation. She complains of  mild nausea but no vomiting. She denies any diarrhea denies any heart. No high-grade fever. She has recently been treated with an antibiotic for 14 days but does not remember the name of this antibiotic  Objective: Filed Vitals:   01/16/15 1258 01/16/15 1300 01/16/15 1400 01/16/15 1513  BP: 157/74 154/76 130/65 118/89  Pulse: 76 86  82  Temp: 98.3 F (36.8 C) 98.3 F (36.8 C) 98.7 F (37.1 C) 98.1 F (36.7 C)  TempSrc: Oral Oral Oral Oral  Resp: 16 18 18 18   Height:      Weight:      SpO2: 98% 96% 97% 97%    Intake/Output Summary (Last 24 hours) at 01/16/15 2023 Last data filed at 01/16/15 1306  Gross per 24 hour  Intake 1386.67 ml  Output     15 ml  Net 1371.67 ml   Filed Weights   01/15/15 1916  Weight: 86.5 kg (190 lb 11.2 oz)     Exam: General: A/O 4, mild abdominal discomfort, No acute respiratory distress Lungs: Clear to auscultation bilaterally without wheezes or crackles Cardiovascular: Regular rate and rhythm without murmur gallop or rub normal S1 and S2 Abdomen: Nontender, nondistended, soft, bowel sounds positive, no rebound, no ascites, no appreciable mass Extremities: No significant cyanosis, clubbing, or edema bilateral lower extremities   Data Reviewed: Basic Metabolic Panel:  Recent Labs Lab 01/15/15 1920 01/16/15 0531  NA 135 134*  K 3.7 4.2  CL 100 101  CO2 29 27  GLUCOSE 112* 107*  BUN 9 9  CREATININE 0.65 0.78  CALCIUM 8.6 8.6  MG 1.9  --    Liver Function Tests:  Recent Labs Lab 01/15/15 1920 01/16/15 0531  AST 11 18  ALT 7 7  ALKPHOS  84 103  BILITOT 0.5 0.7  PROT 6.6 7.1  ALBUMIN 2.1* 2.2*   No results for input(s): LIPASE, AMYLASE in the last 168 hours. No results for input(s): AMMONIA in the last 168 hours. CBC:  Recent Labs Lab 01/15/15 1920 01/16/15 0531  WBC 11.9* 13.3*  HGB 9.9* 11.0*  HCT 31.2* 34.7*  MCV 90.4 90.6  PLT 323 406*   Cardiac Enzymes: No results for input(s): CKTOTAL, CKMB, CKMBINDEX,  TROPONINI in the last 168 hours. BNP (last 3 results) No results for input(s): BNP in the last 8760 hours.  ProBNP (last 3 results) No results for input(s): PROBNP in the last 8760 hours.  CBG:  Recent Labs Lab 01/16/15 0141 01/16/15 0810  GLUCAP 93 114*    No results found for this or any previous visit (from the past 240 hour(s)).   Studies: Ct Image Guided Drainage By Percutaneous Catheter  01/16/2015   CLINICAL DATA:  PELVIC DIVERTICULAR ABSCESS  EXAM: CT GUIDED DRAINAGE OF THE PELVIC DIVERTICULAR ABSCESS  ANESTHESIA/SEDATION: 1.0 Mg IV Versed 50 mcg IV Fentanyl  Total Moderate Sedation Time:  15 MINUTES  PROCEDURE: The procedure, risks, benefits, and alternatives were explained to the patient. Questions regarding the procedure were encouraged and answered. The patient understands and consents to the procedure.  The ANTERIOR PELVIC AREA was prepped with Betadine/chlorhexidinein a sterile fashion, and a sterile drape was applied covering the operative field. A sterile gown and sterile gloves were used for the procedure. Local anesthesia was provided with 1% Lidocaine.  Previous imaging reviewed from [facility]. Patient positioned supine. Noncontrast localization CT performed. the anterior pelvic diverticular abscess over the bladder was localized. Under sterile conditions and local anesthesia, an 18 gauge introducer needle was advanced from a left oblique approach into the abscess. Needle position confirmed with CT. Guidewire inserted followed by tract dilatation to advance a 12 [place] drain. Drain catheter confirmed within the abscess by CT. Drain secured with a Prolene suture and connected to external suction bulb. Patient tolerated the drain procedure well.  COMPLICATIONS: None immediate  FINDINGS: Imaging confirms percutaneous access for drainage of the diverticular abscess.  IMPRESSION: Successful CT-guided anterior pelvic diverticular abscess drain insertion.   Electronically  Signed   By: Jerilynn Mages.  Shick M.D.   On: 01/16/2015 12:46    Scheduled Meds: . ALPRAZolam  0.25 mg Oral BID  . antiseptic oral rinse  7 mL Mouth Rinse q12n4p  . chlorhexidine  15 mL Mouth Rinse BID  . enoxaparin (LOVENOX) injection  40 mg Subcutaneous Q24H  . fentaNYL      . lidocaine      . midazolam      . mirabegron ER  50 mg Oral Daily  . piperacillin-tazobactam (ZOSYN)  IV  3.375 g Intravenous Q8H  . sodium chloride  3 mL Intravenous Q12H   Continuous Infusions: . 0.9 % NaCl with KCl 20 mEq / L 100 mL/hr at 01/16/15 9147    Principal Problem:   Intestinal diverticular abscess Active Problems:   Essential hypertension   Hypokalemia    Time spent: 50 minutes    Betti Goodenow, J  [facility] Pager [phone]. If 7PM-7AM, please contact night-coverage at [email or web address], password [facility] 01/16/2015, 8:23 PM  LOS: 1 day    Care during the described time interval was provided by me .  I have reviewed this patient's available data, including medical history, events of note, physical examination, radiology studies and test results as part of my evaluation  Dia Crawford, MD  [phone] Pager

## 2015-01-16 NOTE — Progress Notes (Signed)
On-call MD Baltazar Najjar paged about foley catheter. New orders received to continue foley cath. Baltazar Najjar was paged again about foley leaking. She stated to continue foley and day team will address issues with foley. Will continue to monitor.

## 2015-01-16 NOTE — Consult Note (Signed)
Urology Consult  CC: Referring physician:  Dr. Derrek Gu Reason for referral: Colovesical filsula  History of Present Illness: Michele Palmer is a 79 year old female patient who was seen in hospital consultation for further evaluation of a reported colovesical fistula. She reports she began having difficulty in early January of this year. She was having voiding symptoms but a urine culture was negative by her account. She then began to experience Brown-colored urine with associated dysuria, frequency, urgency and pneumaturia. She has been treated with several courses of antibiotics without improvement. She therefore underwent cystoscopy on Wednesday by Dr. Exie Parody and she indicated he found a fistula and told her that it was correctable. She was scheduled for a CT scan which was obtained the following day and revealed, by report as no images are available, sigmoid diverticulosis as well as what sounds like a diverticular abscess. She had a Foley catheter placed at the time of her cystoscopy and the following day developed significant suprapubic discomfort and had good emergency room where it was found her catheter was occluded. It was irrigated and began draining properly with significant relief of her suprapubic discomfort. She primarily having lower abdominal pain. She has no flank pain. Her pain is worsened by palpation.   Past Medical History  Diagnosis Date  . Anemia   . Arthritis   . Cancer   . GERD (gastroesophageal reflux disease)   . Anxiety   . Depression   . Breast cancer, right breast 07/04/2013    ILC, 1.8 cm, neg sln receptor+, surgery on 08/01/13    Past Surgical History  Procedure Laterality Date  . Ovary surgery      removed ovaries  . Salpingoophorectomy    . Appendectomy  1966  . Tubal ligation    . Breast lumpectomy with needle localization and axillary sentinel lymph node bx Right 08/01/2013    Procedure: BREAST LUMPECTOMY WITH NEEDLE LOCALIZATION AND AXILLARY SENTINEL LYMPH  NODE BIOPSY;  Surgeon: Haywood Lasso, MD;  Location: [place] [facility];  Service: General;  Laterality: Right;    Medications:  Prior to Admission:  Prescriptions prior to admission  Medication Sig Dispense Refill Last Dose  . ALPRAZolam (XANAX) 0.25 MG tablet Take 0.25 mg by mouth 2 (two) times daily as needed for anxiety.    1 week  . anastrozole (ARIMIDEX) 1 MG tablet Take 1 tablet (1 mg total) by mouth daily. 90 tablet 6 01/14/2015 at Unknown time  . citalopram (CELEXA) 20 MG tablet Take 10 mg by mouth every evening.    01/14/2015 at Unknown time  . losartan (COZAAR) 100 MG tablet Take 100 mg by mouth daily.   01/15/2015 at Unknown time  . ranitidine (ZANTAC) 150 MG capsule Take 150 mg by mouth daily as needed.    unknown  . traMADol (ULTRAM) 50 MG tablet Take 50 mg by mouth 2 (two) times daily.    01/15/2015 at 0730   Scheduled: . ALPRAZolam  0.25 mg Oral BID  . antiseptic oral rinse  7 mL Mouth Rinse q12n4p  . chlorhexidine  15 mL Mouth Rinse BID  . enoxaparin (LOVENOX) injection  40 mg Subcutaneous Q24H  . piperacillin-tazobactam (ZOSYN)  IV  3.375 g Intravenous Q8H  . sodium chloride  3 mL Intravenous Q12H   Continuous: . 0.9 % NaCl with KCl 20 mEq / L 100 mL/hr at 01/16/15 8299    Allergies:  Allergies  Allergen Reactions  . Aspirin Other (See Comments)    Makes me "  shaky"  . Ciprofloxacin Hives  . Codeine Other (See Comments)    Headache  . Sulfur Rash    Welps     Family History  Problem Relation Age of Onset  . Stroke Mother   . Stroke Father   . Cancer Sister     ovarian    Social History:  reports that she has never smoked. She has never used smokeless tobacco. She reports that she does not drink alcohol or use illicit drugs.  Review of Systems: Pertinent items are noted in HPI. A comprehensive review of systems was negative except as noted above. She is not having any significant bowel symptoms.   Physical Exam:  Vital signs in last 24  hours: Temp:  [98.1 F (36.7 C)-98.3 F (36.8 C)] 98.1 F (36.7 C) (02/20 0544) Pulse Rate:  [75] 75 (02/19 1633) Resp:  [12-18] 12 (02/20 0544) BP: (126-140)/(59-75) 135/68 mmHg (02/20 0544) SpO2:  [96 %-98 %] 97 % (02/20 0544) Weight:  [86.5 kg (190 lb 11.2 oz)] 86.5 kg (190 lb 11.2 oz) (02/19 1916) General appearance: alert and appears stated age Head: Normocephalic, without obvious abnormality, atraumatic Eyes: conjunctivae/corneas clear. EOM's intact.  Oropharynx: moist mucous membranes Neck: supple, symmetrical, trachea midline Resp: normal respiratory effort Cardio: regular rate and rhythm Back: symmetric, no curvature. ROM normal. No CVA tenderness. GI: soft, tender; bowel sounds normal; no masses,  no organomegaly  GU: She has normal external genitalia with an 26 French Foley catheter indwelling draining feculent appearing urine.  Extremities: extremities normal, atraumatic, no cyanosis or edema Skin: Skin color normal. No visible rashes or lesions Neurologic: Grossly normal  Laboratory Data:   Recent Labs  01/15/15 1920 01/16/15 0531  WBC 11.9* 13.3*  HGB 9.9* 11.0*  HCT 31.2* 34.7*   BMET  Recent Labs  01/15/15 1920 01/16/15 0531  NA 135 134*  K 3.7 4.2  CL 100 101  CO2 29 27  GLUCOSE 112* 107*  BUN 9 9  CREATININE 0.65 0.78  CALCIUM 8.6 8.6   No results for input(s): LABPT, INR in the last 72 hours. No results for input(s): LABURIN in the last 72 hours. No results found for this or any previous visit. Creatinine:  Recent Labs  01/15/15 1920 01/16/15 0531  CREATININE 0.65 0.78    Imaging: No results found.  Impression/Assessment:  Based on her history and the reported findings by Dr. 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 This is likely secondary to a sigmoid diverticular abscess again by history since no imaging studies are available. Because she does have an abscess in the pelvis and this will likely need to be drained  percutaneously. Once this is been drained adequately and treated with antibiotic therapy repair of her colon and bladder can be undertaken. I told her that this is certainly not something that should be done immediately as the pointing of the abscess into the bladder actually is somewhat beneficial in that it allows drainage. When she has been medically optimized she will need to have her bladder fistula repaired at the time of her GI surgery. I told her I will be available to assist Dr. Donne Hazel with repair of her bladder if he wishes. She has an 34 [place] Foley catheter although she has been having some difficulty with occlusion so if she continues to do so upsizing the catheter would be helpful. In addition it sounds like she may be having some bladder spasms and irritations on going to start Myrbetriq to  help control this without the anticholinergic effects on the bowel.  Plan:   1. Continue Foley catheter drainage with when necessary irrigation. 2. Increased Foley catheter size if she continues to have difficulty with occlusion of her catheter requiring irrigation. I would recommend either a 22 or 24 French catheter. 3. Obtain CT scan imaging results (images not report) or repeat imaging. 4. Antibiotic therapy.  5. Begin Myrbetriq 50 mg 6. Will be available for repair of her bladder fistula.  Michele Palmer C 01/16/2015, 7:53 AM

## 2015-01-16 NOTE — Progress Notes (Deleted)
On-call MD Baltazar Najjar paged about foley catheter. New orders received to continue foley cath. Baltazar Najjar was paged again about foley leaking. She stated to continue foley and day team will address issues with foley.

## 2015-01-16 NOTE — Progress Notes (Signed)
Patient ID: Michele Palmer, female   DOB: Nov 22, 1936, 79 y.o.   MRN: [id number]  Percutaneous drain placed into abscess. Continue antibiotics OK to begin clears. Will reexamine tomorrow.  Imogene Burn. Georgette Dover, MD, [facility]  01/16/2015 2:02 PM

## 2015-01-16 NOTE — Progress Notes (Signed)
IR aware of request for percutaneous drainage of diverticular abscess. Imaging reviewed by Dr. Annamaria Boots, amenable to attempt at perc drainage. Will discuss with pt and proceed today.  Ascencion Dike PA-C Interventional Radiology 01/16/2015 8:25 AM

## 2015-01-16 NOTE — Progress Notes (Signed)
Pt experiencing pain with foley. On-call paged. Dr. Alcario Drought ordered one time flush to attempt to unclog foley cath. Orders implemented. Flushed without difficulty. Pt still experiencing pain. Will continue to monitor and medicate as needed.

## 2015-01-16 NOTE — Procedures (Signed)
Successful CT GUIDED ANTERIOR PELVIC DIVERTICULAR ABSCESS DRAIN No comp Stable Full report in PACS

## 2015-01-17 DIAGNOSIS — F329 Major depressive disorder, single episode, unspecified: Secondary | ICD-10-CM | POA: Diagnosis present

## 2015-01-17 DIAGNOSIS — K5720 Diverticulitis of large intestine with perforation and abscess without bleeding: Secondary | ICD-10-CM | POA: Diagnosis present

## 2015-01-17 DIAGNOSIS — F32A Depression, unspecified: Secondary | ICD-10-CM | POA: Diagnosis present

## 2015-01-17 DIAGNOSIS — F411 Generalized anxiety disorder: Secondary | ICD-10-CM | POA: Diagnosis present

## 2015-01-17 DIAGNOSIS — IMO0002 Reserved for concepts with insufficient information to code with codable children: Secondary | ICD-10-CM | POA: Diagnosis present

## 2015-01-17 LAB — PREALBUMIN: Prealbumin: <3 mg/dL — ABNORMAL LOW (ref 17.0–34.0)

## 2015-01-17 LAB — PHOSPHORUS: PHOSPHORUS: 3.8 mg/dL (ref 2.3–4.6)

## 2015-01-17 LAB — CBC WITH DIFFERENTIAL/PLATELET
Basophils Absolute: 0 10*3/uL (ref 0.0–0.1)
Basophils Relative: 0 % (ref 0–1)
Eosinophils Absolute: 0.1 10*3/uL (ref 0.0–0.7)
Eosinophils Relative: 2 % (ref 0–5)
HCT: 30.2 % — ABNORMAL LOW (ref 36.0–46.0)
HEMOGLOBIN: 9.5 g/dL — AB (ref 12.0–15.0)
Lymphocytes Relative: 27 % (ref 12–46)
Lymphs Abs: 2 10*3/uL (ref 0.7–4.0)
MCH: 28.7 pg (ref 26.0–34.0)
MCHC: 31.5 g/dL (ref 30.0–36.0)
MCV: 91.2 fL (ref 78.0–100.0)
MONO ABS: 0.9 10*3/uL (ref 0.1–1.0)
Monocytes Relative: 12 % (ref 3–12)
Neutro Abs: 4.3 10*3/uL (ref 1.7–7.7)
Neutrophils Relative %: 59 % (ref 43–77)
Platelets: 334 10*3/uL (ref 150–400)
RBC: 3.31 MIL/uL — AB (ref 3.87–5.11)
RDW: 14.4 % (ref 11.5–15.5)
WBC: 7.2 10*3/uL (ref 4.0–10.5)

## 2015-01-17 LAB — COMPREHENSIVE METABOLIC PANEL
ALK PHOS: 80 U/L (ref 39–117)
ALT: 6 U/L (ref 0–35)
AST: 13 U/L (ref 0–37)
Albumin: 1.7 g/dL — ABNORMAL LOW (ref 3.5–5.2)
Anion gap: 3 — ABNORMAL LOW (ref 5–15)
BILIRUBIN TOTAL: 0.6 mg/dL (ref 0.3–1.2)
BUN: 7 mg/dL (ref 6–23)
CHLORIDE: 102 mmol/L (ref 96–112)
CO2: 29 mmol/L (ref 19–32)
Calcium: 8.1 mg/dL — ABNORMAL LOW (ref 8.4–10.5)
Creatinine, Ser: 0.73 mg/dL (ref 0.50–1.10)
GFR calc non Af Amer: 80 mL/min — ABNORMAL LOW (ref >90)
GLUCOSE: 90 mg/dL (ref 70–99)
Potassium: 3.9 mmol/L (ref 3.5–5.1)
Sodium: 134 mmol/L — ABNORMAL LOW (ref 135–145)
Total Protein: 5.7 g/dL — ABNORMAL LOW (ref 6.0–8.3)

## 2015-01-17 LAB — MAGNESIUM: Magnesium: 1.9 mg/dL (ref 1.5–2.5)

## 2015-01-17 LAB — GLUCOSE, CAPILLARY
GLUCOSE-CAPILLARY: 88 mg/dL (ref 70–99)
GLUCOSE-CAPILLARY: 92 mg/dL (ref 70–99)
GLUCOSE-CAPILLARY: 92 mg/dL (ref 70–99)
Glucose-Capillary: 81 mg/dL (ref 70–99)
Glucose-Capillary: 98 mg/dL (ref 70–99)
Glucose-Capillary: 85 mg/dL (ref 70–99)

## 2015-01-17 MED ORDER — POTASSIUM CHLORIDE IN NACL 20-0.9 MEQ/L-% IV SOLN
INTRAVENOUS | Status: DC
  Administered 2015-01-17: via INTRAVENOUS
  Filled 2015-01-17 (×2): qty 1000

## 2015-01-17 MED ORDER — SODIUM CHLORIDE 0.9 % IJ SOLN
10.0000 mL | Freq: Two times a day (BID) | INTRAMUSCULAR | Status: DC
  Administered 2015-01-18 – 2015-02-04 (×14): 10 mL

## 2015-01-17 MED ORDER — FAT EMULSION 20 % IV EMUL
240.0000 mL | INTRAVENOUS | Status: AC
  Administered 2015-01-17: 240 mL via INTRAVENOUS
  Filled 2015-01-17: qty 250

## 2015-01-17 MED ORDER — SODIUM CHLORIDE 0.9 % IJ SOLN
10.0000 mL | INTRAMUSCULAR | Status: DC | PRN
  Administered 2015-01-17 – 2015-01-27 (×6): 10 mL
  Administered 2015-01-28: 20 mL
  Administered 2015-01-28 – 2015-02-04 (×7): 10 mL
  Administered 2015-02-05: 30 mL
  Filled 2015-01-17 (×15): qty 40

## 2015-01-17 MED ORDER — TRACE MINERALS CR-CU-F-FE-I-MN-MO-SE-ZN IV SOLN
INTRAVENOUS | Status: AC
  Administered 2015-01-17: 18:00:00 via INTRAVENOUS
  Filled 2015-01-17: qty 960

## 2015-01-17 MED ORDER — INSULIN ASPART 100 UNIT/ML ~~LOC~~ SOLN
0.0000 [IU] | Freq: Four times a day (QID) | SUBCUTANEOUS | Status: DC
  Administered 2015-01-18: 2 [IU] via SUBCUTANEOUS
  Administered 2015-01-18: 3 [IU] via SUBCUTANEOUS
  Administered 2015-01-18: 1 [IU] via SUBCUTANEOUS
  Administered 2015-01-19: 2 [IU] via SUBCUTANEOUS
  Administered 2015-01-19: 1 [IU] via SUBCUTANEOUS
  Administered 2015-01-19 – 2015-01-21 (×7): 2 [IU] via SUBCUTANEOUS

## 2015-01-17 MED ORDER — SODIUM CHLORIDE 0.9 % IV SOLN
1.0000 g | INTRAVENOUS | Status: DC
  Administered 2015-01-17 – 2015-01-26 (×10): 1 g via INTRAVENOUS
  Filled 2015-01-17 (×11): qty 1

## 2015-01-17 NOTE — Progress Notes (Signed)
[facility] PROGRESS NOTE  Michele Palmer PJA:[id number] DOB: [birth year] DOA: 01/15/2015 PCP: Glenda Chroman., MD  Assessment/Plan: Sigmoid diverticulitis with abscess -Measuring 9.4 cm on CT scan done in 01/14/15 -Start Invanz -Blood culture pending  -Abscess culture pending  -Because of the fistula urology consultation, Dr. Karsten Ro has been notified -219 blood cultures NGTD PICC line placement pending  -TPN. Consult placed -CSW consult home health needs -PT/OT consult  Hypokalemia/dehydration -Clinically dry continue normal saline with KCl 20 mEq /L 60 ml/hr   Hypertension -Stable we will continue to hold losartan  Diabetes diet control -check hemoglobin A1c pending  -Continue sensitive SSI   Anxiety/depression -Continue Xanax 0.25 mg BID    Code Status: Full Family Communication: Son and wife Disposition Plan: Resolution abscess    Consultants: Dr.Mark Nedra Hai (urology) Dr.Matthew Tsuei (CCS)  Procedures: 2/20 CT GUIDED ANTERIOR PELVIC DIVERTICULAR ABSCESS DRAIN    Cultures 2/19 blood right arm/hand NGTD 2/20 abscess pending 2/20 blood pending 2/20 urine pending   Antibiotics: Zosyn 2/19>> stopped 2/21 Metronidazole 2/20>> stopped 2/21 Invanz 2/21>>   DVT prophylaxis Lovenox   HPI/Subjective: [name] PMHx anxiety, depression, right breast cancer 07/04/13 recurrent UTI, hypertension, diet-controlled diabetes  presents in transfer from [place] where she was found to have an intra-abdominal abscess. The patient has been ill for the last 1 month and have been suffering from recurrent urinary tract infections since November of last year. Over the course of the last month the patient has had suprapubic abdominal pain and fecal material in the urine. She saw urologist Dr. Exie Parody who did a cystoscopy 2 days ago and ordered her to have a CT scan. CT scan revealed a sigmoid diverticulitis measuring 9.4 cm, and formation of a fistula with  the bladder. The patient was transferred to [facility] for further evaluation. She complains of mild nausea but no vomiting. She denies any diarrhea denies any heart. No high-grade fever. She has recently been treated with an antibiotic for 14 days but does not remember the name of this antibiotic 2/21 A/O 4, anxious about continuing treatment to include PICC line placement, antibiotic, TNA.    Objective: Filed Vitals:   01/16/15 2059 01/17/15 0536 01/17/15 0814 01/17/15 1555  BP: 137/59 137/73  124/65  Pulse: 67 83  85  Temp: 98.3 F (36.8 C) 97.9 F (36.6 C)  98.8 F (37.1 C)  TempSrc: Oral Oral  Oral  Resp: 17   18  Height:      Weight:   86.6 kg (190 lb 14.7 oz)   SpO2: 98% 96%  93%    Intake/Output Summary (Last 24 hours) at 01/17/15 2013 Last data filed at 01/17/15 1900  Gross per 24 hour  Intake 2730.84 ml  Output   2125 ml  Net 605.84 ml   Filed Weights   01/15/15 1916 01/17/15 0814  Weight: 86.5 kg (190 lb 11.2 oz) 86.6 kg (190 lb 14.7 oz)     Exam: General: A/O 4, mild abdominal discomfort, No acute respiratory distress Lungs: Clear to auscultation bilaterally without wheezes or crackles Cardiovascular: Regular rate and rhythm without murmur gallop or rub normal S1 and S2 Abdomen: Nontender, nondistended, soft, bowel sounds positive, no rebound, no ascites, no appreciable mass, JP drain in place draining brown thick discharge. Extremities: No significant cyanosis, clubbing, or edema bilateral lower extremities   Data Reviewed: Basic Metabolic Panel:  Recent Labs Lab 01/15/15 1920 01/16/15 0531 01/17/15 0604 01/17/15 0728  NA 135 134* 134*  --  K 3.7 4.2 3.9  --   CL 100 101 102  --   CO2 29 27 29   --   GLUCOSE 112* 107* 90  --   BUN 9 9 7   --   CREATININE 0.65 0.78 0.73  --   CALCIUM 8.6 8.6 8.1*  --   MG 1.9  --   --  1.9  PHOS  --   --   --  3.8   Liver Function Tests:  Recent Labs Lab 01/15/15 1920 01/16/15 0531 01/17/15 0604  AST  11 18 13   ALT 7 7 6   ALKPHOS 84 103 80  BILITOT 0.5 0.7 0.6  PROT 6.6 7.1 5.7*  ALBUMIN 2.1* 2.2* 1.7*   No results for input(s): LIPASE, AMYLASE in the last 168 hours. No results for input(s): AMMONIA in the last 168 hours. CBC:  Recent Labs Lab 01/15/15 1920 01/16/15 0531 01/17/15 0604  WBC 11.9* 13.3* 7.2  NEUTROABS  --   --  4.3  HGB 9.9* 11.0* 9.5*  HCT 31.2* 34.7* 30.2*  MCV 90.4 90.6 91.2  PLT 323 406* 334   Cardiac Enzymes: No results for input(s): CKTOTAL, CKMB, CKMBINDEX, TROPONINI in the last 168 hours. BNP (last 3 results) No results for input(s): BNP in the last 8760 hours.  ProBNP (last 3 results) No results for input(s): PROBNP in the last 8760 hours.  CBG:  Recent Labs Lab 01/17/15 0541 01/17/15 0817 01/17/15 1429 01/17/15 1633 01/17/15 1752  GLUCAP 88 92 98 92 85    Recent Results (from the past 240 hour(s))  Culture, blood (routine x 2)     Status: None (Preliminary result)   Collection Time: 01/15/15  7:10 PM  Result Value Ref Range Status   Specimen Description BLOOD RIGHT ARM  Final   Special Requests BOTTLES DRAWN AEROBIC AND ANAEROBIC 5ML  Final   Culture   Final           BLOOD CULTURE RECEIVED NO GROWTH TO DATE CULTURE WILL BE HELD FOR 5 DAYS BEFORE ISSUING A FINAL NEGATIVE REPORT Performed at [facility]    Report Status PENDING  Incomplete  Culture, blood (routine x 2)     Status: None (Preliminary result)   Collection Time: 01/15/15  7:20 PM  Result Value Ref Range Status   Specimen Description BLOOD RIGHT HAND  Final   Special Requests BOTTLES DRAWN AEROBIC AND ANAEROBIC 5ML  Final   Culture   Final           BLOOD CULTURE RECEIVED NO GROWTH TO DATE CULTURE WILL BE HELD FOR 5 DAYS BEFORE ISSUING A FINAL NEGATIVE REPORT Performed at [facility]    Report Status PENDING  Incomplete  Anaerobic culture     Status: None (Preliminary result)   Collection Time: 01/16/15 12:09 PM  Result Value Ref Range Status    Specimen Description ABSCESS ABDOMEN  Final   Special Requests NONE  Final   Gram Stain   Final    ABUNDANT WBC PRESENT, PREDOMINANTLY PMN NO SQUAMOUS EPITHELIAL CELLS SEEN FEW GRAM NEGATIVE RODS FEW GRAM POSITIVE COCCI IN PAIRS    Culture   Final    NO ANAEROBES ISOLATED; CULTURE IN PROGRESS FOR 5 DAYS Performed at [facility]    Report Status PENDING  Incomplete  Culture, routine-abscess     Status: None (Preliminary result)   Collection Time: 01/16/15 12:09 PM  Result Value Ref Range Status   Specimen Description ABSCESS ABDOMEN  Final  Special Requests NONE  Final   Gram Stain PENDING  Incomplete   Culture   Final    Culture reincubated for better growth Performed at [facility]    Report Status PENDING  Incomplete     Studies: Ct Image Guided Drainage By Percutaneous Catheter  01/16/2015   CLINICAL DATA:  PELVIC DIVERTICULAR ABSCESS  EXAM: CT GUIDED DRAINAGE OF THE PELVIC DIVERTICULAR ABSCESS  ANESTHESIA/SEDATION: 1.0 Mg IV Versed 50 mcg IV Fentanyl  Total Moderate Sedation Time:  15 MINUTES  PROCEDURE: The procedure, risks, benefits, and alternatives were explained to the patient. Questions regarding the procedure were encouraged and answered. The patient understands and consents to the procedure.  The ANTERIOR PELVIC AREA was prepped with Betadine/chlorhexidinein a sterile fashion, and a sterile drape was applied covering the operative field. A sterile gown and sterile gloves were used for the procedure. Local anesthesia was provided with 1% Lidocaine.  Previous imaging reviewed from [facility]. Patient positioned supine. Noncontrast localization CT performed. the anterior pelvic diverticular abscess over the bladder was localized. Under sterile conditions and local anesthesia, an 18 gauge introducer needle was advanced from a left oblique approach into the abscess. Needle position confirmed with CT. Guidewire inserted followed by tract dilatation to  advance a 12 [place] drain. Drain catheter confirmed within the abscess by CT. Drain secured with a Prolene suture and connected to external suction bulb. Patient tolerated the drain procedure well.  COMPLICATIONS: None immediate  FINDINGS: Imaging confirms percutaneous access for drainage of the diverticular abscess.  IMPRESSION: Successful CT-guided anterior pelvic diverticular abscess drain insertion.   Electronically Signed   By: Jerilynn Mages.  Shick M.D.   On: 01/16/2015 12:46    Scheduled Meds: . ALPRAZolam  0.25 mg Oral BID  . antiseptic oral rinse  7 mL Mouth Rinse q12n4p  . chlorhexidine  15 mL Mouth Rinse BID  . enoxaparin (LOVENOX) injection  40 mg Subcutaneous Q24H  . ertapenem  1 g Intravenous Q24H  . insulin aspart  0-9 Units Subcutaneous 4 times per day  . mirabegron ER  50 mg Oral Daily  . sodium chloride  10-40 mL Intracatheter Q12H  . sodium chloride  3 mL Intravenous Q12H   Continuous Infusions: . 0.9 % NaCl with KCl 20 mEq / L 60 mL/hr at 01/17/15 1758  . Marland KitchenTPN (CLINIMIX-E) Adult 40 mL/hr at 01/17/15 1803   And  . fat emulsion 240 mL (01/17/15 1803)    Principal Problem:   Intestinal diverticular abscess Active Problems:   Essential hypertension   Hypokalemia   Abdominal abscess   Colonic diverticular abscess   Anxiety state   Depression    Time spent: 50 minutes    WOODS, CURTIS, J  [facility] Pager [phone]. If 7PM-7AM, please contact night-coverage at [email or web address], password [facility] 01/17/2015, 8:13 PM  LOS: 2 days    Care during the described time interval was provided by me .  I have reviewed this patient's available data, including medical history, events of note, physical examination, radiology studies and test results as part of my evaluation  Dia Crawford, MD [phone] Pager

## 2015-01-17 NOTE — Progress Notes (Signed)
Subjective: Having some soreness.  No n/v.  Abscess drain placed yesterday.  Objective: Vital signs in last 24 hours: Temp:  [97.9 F (36.6 C)-98.7 F (37.1 C)] 97.9 F (36.6 C) (02/21 0536) Pulse Rate:  [67-86] 83 (02/21 0536) Resp:  [12-20] 17 (02/20 2059) BP: (118-157)/(49-89) 137/73 mmHg (02/21 0536) SpO2:  [94 %-99 %] 96 % (02/21 0536) Weight:  [190 lb 14.7 oz (86.6 kg)] 190 lb 14.7 oz (86.6 kg) (02/21 0814) Last BM Date: 01/16/15  Intake/Output from previous day: 02/20 0701 - 02/21 0700 In: 1711.7 [I.V.:1361.7; IV Piggyback:350] Out: 6270 [Urine:1025; Drains:65] Intake/Output this shift: Total I/O In: 91.7 [I.V.:91.7] Out: -   General appearance: alert, cooperative, appears stated age and looks uncomfortable Resp: breathing comfortably GI: soft, non distended, tender in lower abdomen.    Lab Results:   Recent Labs  01/16/15 0531 01/17/15 0604  WBC 13.3* 7.2  HGB 11.0* 9.5*  HCT 34.7* 30.2*  PLT 406* 334   BMET  Recent Labs  01/16/15 0531 01/17/15 0604  NA 134* 134*  K 4.2 3.9  CL 101 102  CO2 27 29  GLUCOSE 107* 90  BUN 9 7  CREATININE 0.78 0.73  CALCIUM 8.6 8.1*   PT/INR  Recent Labs  01/16/15 0905  LABPROT 14.6  INR 1.13   ABG No results for input(s): PHART, HCO3 in the last 72 hours.  Invalid input(s): PCO2, PO2  Studies/Results: Ct Image Guided Drainage By Percutaneous Catheter  01/16/2015   CLINICAL DATA:  PELVIC DIVERTICULAR ABSCESS  EXAM: CT GUIDED DRAINAGE OF THE PELVIC DIVERTICULAR ABSCESS  ANESTHESIA/SEDATION: 1.0 Mg IV Versed 50 mcg IV Fentanyl  Total Moderate Sedation Time:  15 MINUTES  PROCEDURE: The procedure, risks, benefits, and alternatives were explained to the patient. Questions regarding the procedure were encouraged and answered. The patient understands and consents to the procedure.  The ANTERIOR PELVIC AREA was prepped with Betadine/chlorhexidinein a sterile fashion, and a sterile drape was applied covering the  operative field. A sterile gown and sterile gloves were used for the procedure. Local anesthesia was provided with 1% Lidocaine.  Previous imaging reviewed from [facility]. Patient positioned supine. Noncontrast localization CT performed. the anterior pelvic diverticular abscess over the bladder was localized. Under sterile conditions and local anesthesia, an 18 gauge introducer needle was advanced from a left oblique approach into the abscess. Needle position confirmed with CT. Guidewire inserted followed by tract dilatation to advance a 12 [place] drain. Drain catheter confirmed within the abscess by CT. Drain secured with a Prolene suture and connected to external suction bulb. Patient tolerated the drain procedure well.  COMPLICATIONS: None immediate  FINDINGS: Imaging confirms percutaneous access for drainage of the diverticular abscess.  IMPRESSION: Successful CT-guided anterior pelvic diverticular abscess drain insertion.   Electronically Signed   By: Jerilynn Mages.  Shick M.D.   On: 01/16/2015 12:46    Anti-infectives: Anti-infectives    Start     Dose/Rate Route Frequency Ordered Stop   01/16/15 2100  metroNIDAZOLE (FLAGYL) IVPB 500 mg     500 mg 100 mL/hr over 60 Minutes Intravenous Every 8 hours 01/16/15 2026     01/16/15 0315  piperacillin-tazobactam (ZOSYN) IVPB 3.375 g     3.375 g 12.5 mL/hr over 240 Minutes Intravenous Every 8 hours 01/16/15 0301     01/15/15 1830  piperacillin-tazobactam (ZOSYN) IVPB 3.375 g     3.375 g 100 mL/hr over 30 Minutes Intravenous  Once 01/15/15 1807 01/15/15 2059      Assessment/Plan: s/p *  No surgery found * Diverticulitis wtih abscess and colovesical fistula  NPO (bowel rest) IV Antibiotics  Severe protein calorie malnutrition Start TNA, will need PICC.  Will end up needing surgery in conjunction with urology. Would like to maximize condition prior to undertaking this repair.  ? Does she need cardiology evaluation. Will order CXR and EKG.    Surgery to follow  LOS: 2 days    [facility] 01/17/2015

## 2015-01-17 NOTE — Progress Notes (Signed)
PARENTERAL NUTRITION CONSULT NOTE - FOLLOW UP  Pharmacy Consult for TPN Indication: Diverticulitis with abscess, colovesical fistula  Allergies  Allergen Reactions  . Aspirin Other (See Comments)    Makes me "shaky"  . Ciprofloxacin Hives  . Codeine Other (See Comments)    Headache  . Sulfur Rash    Welps     Patient Measurements: Height: 5\' 3"  (160 cm) Weight: 190 lb 14.7 oz (86.6 kg) IBW/kg (Calculated) : 52.4 Adjusted Body Weight: 66 kg  Vital Signs: Temp: 97.9 F (36.6 C) (02/21 0536) Temp Source: Oral (02/21 0536) BP: 137/73 mmHg (02/21 0536) Pulse Rate: 83 (02/21 0536) Intake/Output from previous day: 02/20 0701 - 02/21 0700 In: 1711.7 [I.V.:1361.7; IV Piggyback:350] Out: 5277 [Urine:1025; Drains:65] Intake/Output from this shift: Total I/O In: 101.7 [I.V.:91.7; Other:10] Out: 30 [Drains:30]  Labs:  Recent Labs  01/15/15 1920 01/16/15 0531 01/16/15 0905 01/17/15 0604  WBC 11.9* 13.3*  --  7.2  HGB 9.9* 11.0*  --  9.5*  HCT 31.2* 34.7*  --  30.2*  PLT 323 406*  --  334  INR  --   --  1.13  --      Recent Labs  01/15/15 1920 01/16/15 0531 01/17/15 0604 01/17/15 0728  NA 135 134* 134*  --   K 3.7 4.2 3.9  --   CL 100 101 102  --   CO2 29 27 29   --   GLUCOSE 112* 107* 90  --   BUN 9 9 7   --   CREATININE 0.65 0.78 0.73  --   CALCIUM 8.6 8.6 8.1*  --   MG 1.9  --   --  1.9  PHOS  --   --   --  3.8  PROT 6.6 7.1 5.7*  --   ALBUMIN 2.1* 2.2* 1.7*  --   AST 11 18 13   --   ALT 7 7 6   --   ALKPHOS 84 103 80  --   BILITOT 0.5 0.7 0.6  --    Estimated Creatinine Clearance: 61.5 mL/min (by C-G formula based on Cr of 0.73).    Recent Labs  01/17/15 0203 01/17/15 0541 01/17/15 0817  GLUCAP 81 88 92    Medications:  Infusions:  . 0.9 % NaCl with KCl 20 mEq / L 100 mL/hr at 01/17/15 0330    Insulin Requirements in the past 24 hours:  none  Current Nutrition:  NPO  Assessment:  GI: Sigmoid diverticulitis with abscess and  colovesical fistula. Abscess drained, for colovesical fistula repair after nutrition is optimized. Albumin 1.7. Endo: hx DM (diet-controlled). Glucose on BMET <150. Will provide CBG monitoring and SSI with TPN initiation. Lytes: K 3.9, Mag 1.9, Phos 3.8. Renal: SCr stable. NS with 20K at 100 ml/hr. Pulm: 2L [place] Cards: VSS Hepatobil: LFTs WNL Neuro: A&O ID: Diverticulitis with abscess, s/p drain placement 2/21. D#2 Zosyn. Blood and abscess cultures pending.  Best Practices: Lovenox TPN Access: PICC ordered 2/21 TPN day#: 1 (2/21>)   Nutritional Goals:  ~2000 kCal, ~90 grams of protein per day  Plan:  Start Clinimix E 5/15 at 40 ml/hr + 20% lipids at 10 ml/hr. This will provide 1162kCal and 48 g of protein.  I estimate her goal TPN rate is ~75 ml/hr. Follow up for nutritional goals from RD. Decrease IV fluids to 60 ml/hr when TPN starts Check CBGs and provide SSI for coverage q6h TPN Labs on Monday  Legrand Como, [place].D., BCPS, AAHIVP Clinical Pharmacist Phone: [phone] or  [phone] 01/17/2015, 10:00 AM

## 2015-01-17 NOTE — Progress Notes (Signed)
Subjective: Pt feeling a little better today. Still some suprapubic pains No N/V  Objective: Physical Exam: BP 137/73 mmHg  Pulse 83  Temp(Src) 97.9 F (36.6 C) (Oral)  Resp 17  Ht 5\' 3"  (1.6 m)  Wt 190 lb 14.7 oz (86.6 kg)  BMI 33.83 kg/m2  SpO2 96% LLQ drain intact, site clean, minimally tender Output still brownish beige, but thin. 95 mL total output so far. Cx pending  Labs: CBC  Recent Labs  01/16/15 0531 01/17/15 0604  WBC 13.3* 7.2  HGB 11.0* 9.5*  HCT 34.7* 30.2*  PLT 406* 334   BMET  Recent Labs  01/16/15 0531 01/17/15 0604  NA 134* 134*  K 4.2 3.9  CL 101 102  CO2 27 29  GLUCOSE 107* 90  BUN 9 7  CREATININE 0.78 0.73  CALCIUM 8.6 8.1*   LFT  Recent Labs  01/17/15 0604  PROT 5.7*  ALBUMIN 1.7*  AST 13  ALT 6  ALKPHOS 80  BILITOT 0.6   PT/INR  Recent Labs  01/16/15 0905  LABPROT 14.6  INR 1.13     Studies/Results: Ct Image Guided Drainage By Percutaneous Catheter  01/16/2015   CLINICAL DATA:  PELVIC DIVERTICULAR ABSCESS  EXAM: CT GUIDED DRAINAGE OF THE PELVIC DIVERTICULAR ABSCESS  ANESTHESIA/SEDATION: 1.0 Mg IV Versed 50 mcg IV Fentanyl  Total Moderate Sedation Time:  15 MINUTES  PROCEDURE: The procedure, risks, benefits, and alternatives were explained to the patient. Questions regarding the procedure were encouraged and answered. The patient understands and consents to the procedure.  The ANTERIOR PELVIC AREA was prepped with Betadine/chlorhexidinein a sterile fashion, and a sterile drape was applied covering the operative field. A sterile gown and sterile gloves were used for the procedure. Local anesthesia was provided with 1% Lidocaine.  Previous imaging reviewed from [facility]. Patient positioned supine. Noncontrast localization CT performed. the anterior pelvic diverticular abscess over the bladder was localized. Under sterile conditions and local anesthesia, an 18 gauge introducer needle was advanced from a left oblique  approach into the abscess. Needle position confirmed with CT. Guidewire inserted followed by tract dilatation to advance a 12 [place] drain. Drain catheter confirmed within the abscess by CT. Drain secured with a Prolene suture and connected to external suction bulb. Patient tolerated the drain procedure well.  COMPLICATIONS: None immediate  FINDINGS: Imaging confirms percutaneous access for drainage of the diverticular abscess.  IMPRESSION: Successful CT-guided anterior pelvic diverticular abscess drain insertion.   Electronically Signed   By: Jerilynn Mages.  Shick M.D.   On: 01/16/2015 12:46    Assessment/Plan: Diverticular abscess with colovesical fistula S/p perc LLQ drain 2/21 WBC down to normal. Cont drain care Other plans per primary/CCS/Uro    LOS: 2 days    Ascencion Dike PA-C 01/17/2015 9:15 AM

## 2015-01-17 NOTE — Progress Notes (Signed)
Peripherally Inserted Central Catheter/Midline Placement  The IV Nurse has discussed with the patient and/or persons authorized to consent for the patient, the purpose of this procedure and the potential benefits and risks involved with this procedure.  The benefits include less needle sticks, lab draws from the catheter and patient may be discharged home with the catheter.  Risks include, but not limited to, infection, bleeding, blood clot (thrombus formation), and puncture of an artery; nerve damage and irregular heat beat.  Alternatives to this procedure were also discussed.  PICC/Midline Placement Documentation  PICC / Midline Double Lumen 01/17/15 PICC Right Cephalic 39 cm 0 cm (Active)  Indication for Insertion or Continuance of Line Administration of hyperosmolar/irritating solutions (i.e. TPN, Vancomycin, etc.) 01/17/2015  1:01 PM  Exposed Catheter (cm) 0 cm 01/17/2015  1:01 PM  Lumen #1 Status Flushed;Saline locked;Blood return noted 01/17/2015  1:01 PM  Lumen #2 Status Flushed;Saline locked;Blood return noted 01/17/2015  1:01 PM  Dressing Change Due 01/24/15 01/17/2015  1:01 PM       Gordan Payment 01/17/2015, 1:03 PM

## 2015-01-18 LAB — PREALBUMIN: PREALBUMIN: 3.4 mg/dL — AB (ref 17.0–34.0)

## 2015-01-18 LAB — COMPREHENSIVE METABOLIC PANEL
ALBUMIN: 1.7 g/dL — AB (ref 3.5–5.2)
ALT: 7 U/L (ref 0–35)
ANION GAP: 3 — AB (ref 5–15)
AST: 13 U/L (ref 0–37)
Alkaline Phosphatase: 71 U/L (ref 39–117)
BILIRUBIN TOTAL: 0.3 mg/dL (ref 0.3–1.2)
BUN: 7 mg/dL (ref 6–23)
CHLORIDE: 101 mmol/L (ref 96–112)
CO2: 31 mmol/L (ref 19–32)
CREATININE: 0.62 mg/dL (ref 0.50–1.10)
Calcium: 8.2 mg/dL — ABNORMAL LOW (ref 8.4–10.5)
GFR calc non Af Amer: 85 mL/min — ABNORMAL LOW (ref >90)
Glucose, Bld: 134 mg/dL — ABNORMAL HIGH (ref 70–99)
Potassium: 3.7 mmol/L (ref 3.5–5.1)
SODIUM: 135 mmol/L (ref 135–145)
TOTAL PROTEIN: 5.7 g/dL — AB (ref 6.0–8.3)

## 2015-01-18 LAB — GLUCOSE, CAPILLARY
GLUCOSE-CAPILLARY: 144 mg/dL — AB (ref 70–99)
GLUCOSE-CAPILLARY: 132 mg/dL — AB (ref 70–99)
GLUCOSE-CAPILLARY: 183 mg/dL — AB (ref 70–99)
Glucose-Capillary: 118 mg/dL — ABNORMAL HIGH (ref 70–99)
Glucose-Capillary: 205 mg/dL — ABNORMAL HIGH (ref 70–99)

## 2015-01-18 LAB — HEMOGLOBIN A1C
Hgb A1c MFr Bld: 6.5 % — ABNORMAL HIGH (ref 4.8–5.6)
Mean Plasma Glucose: 140 mg/dL

## 2015-01-18 LAB — TRIGLYCERIDES: Triglycerides: 86 mg/dL (ref <150)

## 2015-01-18 LAB — URINE CULTURE
COLONY COUNT: NO GROWTH
CULTURE: NO GROWTH

## 2015-01-18 LAB — MAGNESIUM: Magnesium: 1.8 mg/dL (ref 1.5–2.5)

## 2015-01-18 LAB — PHOSPHORUS: Phosphorus: 3.3 mg/dL (ref 2.3–4.6)

## 2015-01-18 MED ORDER — BOOST / RESOURCE BREEZE PO LIQD
1.0000 | Freq: Three times a day (TID) | ORAL | Status: DC
  Administered 2015-01-18 – 2015-02-01 (×35): 1 via ORAL

## 2015-01-18 MED ORDER — TRACE MINERALS CR-CU-F-FE-I-MN-MO-SE-ZN IV SOLN
INTRAVENOUS | Status: AC
  Administered 2015-01-18: 18:00:00 via INTRAVENOUS
  Filled 2015-01-18: qty 1440

## 2015-01-18 MED ORDER — SODIUM CHLORIDE 0.9 % IV SOLN: INTRAVENOUS | Status: DC

## 2015-01-18 MED ORDER — FAT EMULSION 20 % IV EMUL
240.0000 mL | INTRAVENOUS | Status: AC
  Administered 2015-01-18: 240 mL via INTRAVENOUS
  Filled 2015-01-18: qty 250

## 2015-01-18 NOTE — Evaluation (Addendum)
Physical Therapy Evaluation Patient Details Name: Michele Palmer MRN: [id number] DOB: 06-21-1936 Today's Date: 01/18/2015   History of Present Illness  Abdominal pain. CT scan revealed a sigmoid diverticulitis measuring 9.4 cm, and formation of a fistula with the bladder  Clinical Impression  Pt admitted with above diagnosis. Pt currently with functional limitations due to the deficits listed below (see PT Problem List).  Pt will benefit from skilled PT to increase their independence and safety with mobility to allow discharge to the venue listed below.  Pt with good family support and recommend HHPT and RW for home use.     Follow Up Recommendations Home health PT    Equipment Recommendations  Rolling walker with 5" wheels    Recommendations for Other Services       Precautions / Restrictions Precautions Precautions: Fall Precaution Comments: JP drain Restrictions Weight Bearing Restrictions: No      Mobility  Bed Mobility Overal bed mobility: Needs Assistance Bed Mobility: Rolling;Sit to Sidelying;Supine to Sit Rolling: Supervision Sidelying to sit: Min assist Supine to sit: Min guard Sit to supine: Min assist (Bil LEs) Sit to sidelying: Min assist General bed mobility comments: Pt sat up in bed before PT could instruct on log rolling.  sit >sidelying pt needed A for LE  Transfers Overall transfer level: Needs assistance Equipment used: Rolling walker (2 wheeled) Transfers: Sit to/from Stand Sit to Stand: Min guard            Ambulation/Gait Ambulation/Gait assistance: Min guard Ambulation Distance (Feet): 100 Feet Assistive device: Rolling walker (2 wheeled) Gait Pattern/deviations: Step-through pattern Gait velocity: decreased   General Gait Details: Pt with guarded slow gait with some difficulty maneuvering RW with turns but no LOB.  Stairs            Wheelchair Mobility    Modified Rankin (Stroke Patients Only)       Balance Overall  balance assessment: Needs assistance Sitting-balance support: Feet supported Sitting balance-Leahy Scale: Fair     Standing balance support: Bilateral upper extremity supported Standing balance-Leahy Scale: Fair                               Pertinent Vitals/Pain Pain Assessment: 0-10 Pain Score: 4  (increased with gait) Pain Location: lower abdomen Pain Descriptors / Indicators: Pressure Pain Intervention(s): Patient requesting pain meds-RN notified;Limited activity within patient's tolerance;Monitored during session    [place] expects to be discharged to:: Private residence Living Arrangements: Spouse/significant other Available Help at Discharge: Family;Available 24 hours/day Type of Home: House Home Access: Stairs to enter Entrance Stairs-Rails: None Entrance Stairs-Number of Steps: 2 Home Layout: One level Home Equipment: None      Prior Function Level of Independence: Independent               Hand Dominance   Dominant Hand: Right    Extremity/Trunk Assessment   Upper Extremity Assessment: Defer to OT evaluation           Lower Extremity Assessment: Overall WFL for tasks assessed      Cervical / Trunk Assessment: Normal  Communication   Communication: No difficulties  Cognition Arousal/Alertness: Awake/alert Behavior During Therapy: WFL for tasks assessed/performed Overall Cognitive Status: Within Functional Limits for tasks assessed                      General Comments General comments (skin integrity, edema, etc.): Daughter present  and asking about tub transfer bench as pt was having trouble getting into shower and has been sitting on edge of tub for bathing. Discussed with case management and pt's insurance will not cover.  Spoke with daughter about places she could purchase.    Exercises        Assessment/Plan    PT Assessment Patient needs continued PT services  PT Diagnosis Difficulty  walking   PT Problem List Decreased activity tolerance;Decreased balance;Decreased mobility;Decreased knowledge of use of DME  PT Treatment Interventions DME instruction;Gait training;Functional mobility training;Therapeutic activities;Therapeutic exercise;Stair training   PT Goals (Current goals can be found in the Care Plan section) Acute Rehab PT Goals Patient Stated Goal: to get stronger PT Goal Formulation: With patient/family Time For Goal Achievement: 02/01/15 Potential to Achieve Goals: Good    Frequency Min 3X/week   Barriers to discharge        Co-evaluation               End of Session Equipment Utilized During Treatment: Gait belt Activity Tolerance: Patient tolerated treatment well Patient left: in bed;with call bell/phone within reach;with family/visitor present Nurse Communication: Patient requests pain meds         Time:  13:41- 14:09     Charges:    PT EVAL GAIT TRAINING x 1     PT G Codes:       Michele Palmer, [place] Pager [phone] 01/18/2015   Michele Palmer LUBECK 01/18/2015, 2:40 PM

## 2015-01-18 NOTE — Progress Notes (Signed)
Referring Physician(s): TRH  Subjective:  Diverticular abscess Drain placed LLQ 2/20 Feeling better Ambulating some  Allergies: Aspirin; Ciprofloxacin; Codeine; and Sulfur  Medications: Prior to Admission medications   Medication Sig Start Date End Date Taking? Authorizing Provider  ALPRAZolam (XANAX) 0.25 MG tablet Take 0.25 mg by mouth 2 (two) times daily as needed for anxiety.    Yes Historical Provider, MD  anastrozole (ARIMIDEX) 1 MG tablet Take 1 tablet (1 mg total) by mouth daily. 02/09/14  Yes Deatra Robinson, MD  citalopram (CELEXA) 20 MG tablet Take 10 mg by mouth every evening.  01/03/15  Yes Historical Provider, MD  losartan (COZAAR) 100 MG tablet Take 100 mg by mouth daily.   Yes Historical Provider, MD  ranitidine (ZANTAC) 150 MG capsule Take 150 mg by mouth daily as needed.    Yes Historical Provider, MD  traMADol (ULTRAM) 50 MG tablet Take 50 mg by mouth 2 (two) times daily.  01/13/15  Yes Historical Provider, MD     Vital Signs: BP 134/76 mmHg  Pulse 75  Temp(Src) 97.6 F (36.4 C) (Oral)  Resp 18  Ht 5\' 3"  (1.6 m)  Wt 85.3 kg (188 lb 0.8 oz)  BMI 33.32 kg/m2  SpO2 97%  Physical Exam  Abdominal:  LLQ abscess drain intact Site clean and dry NT; no bleeding Output purulent 50 cc yesterday 20 cc in JP No growth so far Wbc 7.2 afeb vss     Imaging: Ct Image Guided Drainage By Percutaneous Catheter  01/16/2015   CLINICAL DATA:  PELVIC DIVERTICULAR ABSCESS  EXAM: CT GUIDED DRAINAGE OF THE PELVIC DIVERTICULAR ABSCESS  ANESTHESIA/SEDATION: 1.0 Mg IV Versed 50 mcg IV Fentanyl  Total Moderate Sedation Time:  15 MINUTES  PROCEDURE: The procedure, risks, benefits, and alternatives were explained to the patient. Questions regarding the procedure were encouraged and answered. The patient understands and consents to the procedure.  The ANTERIOR PELVIC AREA was prepped with Betadine/chlorhexidinein a sterile fashion, and a sterile drape was applied covering the  operative field. A sterile gown and sterile gloves were used for the procedure. Local anesthesia was provided with 1% Lidocaine.  Previous imaging reviewed from [facility]. Patient positioned supine. Noncontrast localization CT performed. the anterior pelvic diverticular abscess over the bladder was localized. Under sterile conditions and local anesthesia, an 18 gauge introducer needle was advanced from a left oblique approach into the abscess. Needle position confirmed with CT. Guidewire inserted followed by tract dilatation to advance a 12 [place] drain. Drain catheter confirmed within the abscess by CT. Drain secured with a Prolene suture and connected to external suction bulb. Patient tolerated the drain procedure well.  COMPLICATIONS: None immediate  FINDINGS: Imaging confirms percutaneous access for drainage of the diverticular abscess.  IMPRESSION: Successful CT-guided anterior pelvic diverticular abscess drain insertion.   Electronically Signed   By: Jerilynn Mages.  Shick M.D.   On: 01/16/2015 12:46    Labs:  CBC:  Recent Labs  08/25/14 1341 01/15/15 1920 01/16/15 0531 01/17/15 0604  WBC 11.0* 11.9* 13.3* 7.2  HGB 12.5 9.9* 11.0* 9.5*  HCT 38.4 31.2* 34.7* 30.2*  PLT 215 323 406* 334    COAGS:  Recent Labs  01/16/15 0905  INR 1.13    BMP:  Recent Labs  01/15/15 1920 01/16/15 0531 01/17/15 0604 01/18/15 0513  NA 135 134* 134* 135  K 3.7 4.2 3.9 3.7  CL 100 101 102 101  CO2 29 27 29 31   GLUCOSE 112* 107* 90 134*  BUN 9 9 7 7   CALCIUM 8.6 8.6 8.1* 8.2*  CREATININE 0.65 0.78 0.73 0.62  GFRNONAA 83* 79* 80* 85*  GFRAA >90 >90 >90 >90    LIVER FUNCTION TESTS:  Recent Labs  01/15/15 1920 01/16/15 0531 01/17/15 0604 01/18/15 0513  BILITOT 0.5 0.7 0.6 0.3  AST 11 18 13 13   ALT 7 7 6 7   ALKPHOS 84 103 80 71  PROT 6.6 7.1 5.7* 5.7*  ALBUMIN 2.1* 2.2* 1.7* 1.7*    Assessment and Plan:  LLQ abscess drain in place Will follow [place] every shift Will need CT  and drain injection prior to pull  Signed: Madgeline Rayo A 01/18/2015, 1:13 PM   I spent a total of 15 Minutes in face to face in clinical consultation/evaluation, greater than 50% of which was counseling/coordinating care for LLQ abscess drain

## 2015-01-18 NOTE — Progress Notes (Signed)
Patient ID: Michele Palmer, female   DOB: Sep 02, 1936, 79 y.o.   MRN: [id number]    Subjective: Pt feels well today.  Pain is less  Objective: Vital signs in last 24 hours: Temp:  [97.6 F (36.4 C)-98.8 F (37.1 C)] 97.6 F (36.4 C) (02/22 0600) Pulse Rate:  [75-85] 75 (02/22 0600) Resp:  [18] 18 (02/22 0600) BP: (124-136)/(65-76) 134/76 mmHg (02/22 0600) SpO2:  [93 %-97 %] 97 % (02/22 0600) Weight:  [188 lb 0.8 oz (85.3 kg)] 188 lb 0.8 oz (85.3 kg) (02/22 0500) Last BM Date: 01/17/15  Intake/Output from previous day: 02/21 0701 - 02/22 0700 In: 2497.8 [I.V.:1805.3; IV Piggyback:100; TPN:572.5] Out: 1050 [Urine:1000; Drains:50] Intake/Output this shift: Total I/O In: 10 [Other:10] Out: -   PE: Abd: soft, still somewhat tender in the lower abdomen, but improved.  JP drain with purulent, feculent output.  Foley with urine and flecks of stool as well.  +BS, ND Heart: regular  Lab Results:   Recent Labs  01/16/15 0531 01/17/15 0604  WBC 13.3* 7.2  HGB 11.0* 9.5*  HCT 34.7* 30.2*  PLT 406* 334   BMET  Recent Labs  01/17/15 0604 01/18/15 0513  NA 134* 135  K 3.9 3.7  CL 102 101  CO2 29 31  GLUCOSE 90 134*  BUN 7 7  CREATININE 0.73 0.62  CALCIUM 8.1* 8.2*   PT/INR  Recent Labs  01/16/15 0905  LABPROT 14.6  INR 1.13   CMP     Component Value Date/Time   NA 135 01/18/2015 0513   NA 138 08/25/2014 1341   K 3.7 01/18/2015 0513   K 4.0 08/25/2014 1341   CL 101 01/18/2015 0513   CO2 31 01/18/2015 0513   CO2 28 08/25/2014 1341   GLUCOSE 134* 01/18/2015 0513   GLUCOSE 177* 08/25/2014 1341   BUN 7 01/18/2015 0513   BUN 20.4 08/25/2014 1341   CREATININE 0.62 01/18/2015 0513   CREATININE 0.8 08/25/2014 1341   CALCIUM 8.2* 01/18/2015 0513   CALCIUM 9.7 08/25/2014 1341   PROT 5.7* 01/18/2015 0513   PROT 7.6 08/25/2014 1341   ALBUMIN 1.7* 01/18/2015 0513   ALBUMIN 3.2* 08/25/2014 1341   AST 13 01/18/2015 0513   AST 12 08/25/2014 1341   ALT 7 01/18/2015  0513   ALT 11 08/25/2014 1341   ALKPHOS 71 01/18/2015 0513   ALKPHOS 114 08/25/2014 1341   BILITOT 0.3 01/18/2015 0513   BILITOT 0.60 08/25/2014 1341   GFRNONAA 85* 01/18/2015 0513   GFRAA >90 01/18/2015 0513   Lipase  No results found for: LIPASE     Studies/Results: Ct Image Guided Drainage By Percutaneous Catheter  01/16/2015   CLINICAL DATA:  PELVIC DIVERTICULAR ABSCESS  EXAM: CT GUIDED DRAINAGE OF THE PELVIC DIVERTICULAR ABSCESS  ANESTHESIA/SEDATION: 1.0 Mg IV Versed 50 mcg IV Fentanyl  Total Moderate Sedation Time:  15 MINUTES  PROCEDURE: The procedure, risks, benefits, and alternatives were explained to the patient. Questions regarding the procedure were encouraged and answered. The patient understands and consents to the procedure.  The ANTERIOR PELVIC AREA was prepped with Betadine/chlorhexidinein a sterile fashion, and a sterile drape was applied covering the operative field. A sterile gown and sterile gloves were used for the procedure. Local anesthesia was provided with 1% Lidocaine.  Previous imaging reviewed from [facility]. Patient positioned supine. Noncontrast localization CT performed. the anterior pelvic diverticular abscess over the bladder was localized. Under sterile conditions and local anesthesia, an 18 gauge introducer needle was  advanced from a left oblique approach into the abscess. Needle position confirmed with CT. Guidewire inserted followed by tract dilatation to advance a 12 [place] drain. Drain catheter confirmed within the abscess by CT. Drain secured with a Prolene suture and connected to external suction bulb. Patient tolerated the drain procedure well.  COMPLICATIONS: None immediate  FINDINGS: Imaging confirms percutaneous access for drainage of the diverticular abscess.  IMPRESSION: Successful CT-guided anterior pelvic diverticular abscess drain insertion.   Electronically Signed   By: Jerilynn Mages.  Shick M.D.   On: 01/16/2015 12:46     Anti-infectives: Anti-infectives    Start     Dose/Rate Route Frequency Ordered Stop   01/17/15 2100  ertapenem [facility]) 1 g in sodium chloride 0.9 % 50 mL IVPB     1 g 100 mL/hr over 30 Minutes Intravenous Every 24 hours 01/17/15 2012     01/16/15 2100  metroNIDAZOLE (FLAGYL) IVPB 500 mg  Status:  Discontinued     500 mg 100 mL/hr over 60 Minutes Intravenous Every 8 hours 01/16/15 2026 01/17/15 1019   01/16/15 0315  piperacillin-tazobactam (ZOSYN) IVPB 3.375 g  Status:  Discontinued     3.375 g 12.5 mL/hr over 240 Minutes Intravenous Every 8 hours 01/16/15 0301 01/17/15 2012   01/15/15 1830  piperacillin-tazobactam (ZOSYN) IVPB 3.375 g     3.375 g 100 mL/hr over 30 Minutes Intravenous  Once 01/15/15 1807 01/15/15 2059       Assessment/Plan  1. Diverticulitis with abscess and colovesical fistula 2. SPCM/TNA  Plan: 1. The patient's prealbumin is less than 3.  She is not a surgical candidate at this time unless it were to be an emergency as she is at high risk for complications due to her poor nutritional status.  She will need to be kept on her TNA and I have added resource breeze to assist with boosting her nutrition.  Once we can get her nutrition to a more normal level, then we can consider surgical intervention.  This has been d/w the patient and her son who is present in the room.  They understands and agree with current plan. 2. She can have clear liquids today. 3. Cont with IR drain.  She will likely need a repeat CT scan at some point later in the week, but given her drain has some feculent appearing output, we may follow her drain clinically right now.   4. Cont IV abx therapy.  IV Invanz, put in place by primary service.  D2  LOS: 3 days      Michele Palmer E 01/18/2015, 8:20 AM Pager: [phone]

## 2015-01-18 NOTE — Evaluation (Signed)
Occupational Therapy Evaluation Patient Details Name: Michele Palmer MRN: [id number] DOB: 11-15-1936 Today's Date: 01/18/2015    History of Present Illness Abdominal pain. CT scan revealed a sigmoid diverticulitis measuring 9.4 cm, and formation of a fistula with the bladder   Clinical Impression   This 79 yo female admitted with above presents to acute OT with increased pain, decreased sitting and standing tolerance due to this, decreased mobility, generalized weakness, and obesity all affecting her ability to care for herself at an independent level as she was pta. She will benefit from acute OT without need for follow up (unless things change after surgery)    Follow Up Recommendations  Home health OT    Equipment Recommendations  None recommended by OT       Precautions / Restrictions Precautions Precautions: Fall Precaution Comments: JP drain Restrictions Weight Bearing Restrictions: No      Mobility Bed Mobility Overal bed mobility: Needs Assistance Bed Mobility: Rolling;Sidelying to Sit;Sit to Supine Rolling: Supervision (VCs for technique) Sidelying to sit: Min assist   Sit to supine: Min assist (Bil LEs)      Transfers Overall transfer level: Needs assistance Equipment used: Rolling walker (2 wheeled) Transfers: Sit to/from Stand Sit to Stand: Min guard              Balance Overall balance assessment: Needs assistance Sitting-balance support: Feet supported;No upper extremity supported Sitting balance-Leahy Scale: Fair     Standing balance support: Bilateral upper extremity supported Standing balance-Leahy Scale: Fair                              ADL Overall ADL's : Needs assistance/impaired Eating/Feeding: Independent;Sitting   Grooming: Set up;Sitting   Upper Body Bathing: Set up;Sitting   Lower Body Bathing: Moderate assistance (with min guard A sit<>stand)   Upper Body Dressing : Minimal assistance;Sitting   Lower Body  Dressing: Moderate assistance (with min guard A sit<>stand)   Toilet Transfer: Minimal assistance;Ambulation;RW (Bed>door>bed)   Toileting- Clothing Manipulation and Hygiene: Moderate assistance (with min guard A sit<>stand and A to keep foley and JP drain out of the way)                         Pertinent Vitals/Pain Pain Assessment: 0-10 Pain Score: 6  Pain Location: lower abdomen Pain Descriptors / Indicators: Tender;Aching;Pressure Pain Intervention(s): Repositioned;Patient requesting pain meds-RN notified     Hand Dominance Right   Extremity/Trunk Assessment Upper Extremity Assessment Upper Extremity Assessment: Overall WFL for tasks assessed   Lower Extremity Assessment Lower Extremity Assessment: Overall WFL for tasks assessed       Communication Communication Communication: No difficulties   Cognition Arousal/Alertness: Awake/alert Behavior During Therapy: WFL for tasks assessed/performed Overall Cognitive Status: Within Functional Limits for tasks assessed                                Home Living Family/patient expects to be discharged to:: Private residence Living Arrangements: Spouse/significant other Available Help at Discharge: Family;Available 24 hours/day Type of Home: House Home Access: Stairs to enter [employer] of Steps: 2 Entrance Stairs-Rails: None Home Layout: One level     Bathroom Shower/Tub: Tub/shower unit;Curtain Shower/tub characteristics: Architectural technologist: Handicapped height     Home Equipment: None          Prior Functioning/Environment Level of Independence: Independent  OT Diagnosis: Generalized weakness;Acute pain   OT Problem List: Decreased activity tolerance;Pain;Impaired balance (sitting and/or standing);Obesity   OT Treatment/Interventions: Self-care/ADL training;DME and/or AE instruction;Patient/family education;Balance training    OT Goals(Current goals can  be found in the care plan section) Acute Rehab OT Goals Patient Stated Goal: waiting on sx OT Goal Formulation: With patient Time For Goal Achievement: 02/01/15 Potential to Achieve Goals: Good  OT Frequency: Min 2X/week              End of Session Equipment Utilized During Treatment: Rolling walker Nurse Communication: Patient requests pain meds  Activity Tolerance: Patient limited by pain Patient left: in bed;with call bell/phone within reach;with family/visitor present   Time: 1132-1204 OT Time Calculation (min): 32 min Charges:  OT General Charges $OT Visit: 1 Procedure OT Evaluation $Initial OT Evaluation Tier I: 1 Procedure OT Treatments $Self Care/Home Management : 8-22 mins  Almon Register [phone] 01/18/2015, 12:37 PM

## 2015-01-18 NOTE — Progress Notes (Signed)
PARENTERAL NUTRITION CONSULT NOTE - FOLLOW UP  Pharmacy Consult for TPN Indication: Diverticulitis with abscess, colovesical fistula  Allergies  Allergen Reactions  . Aspirin Other (See Comments)    Makes me "shaky"  . Ciprofloxacin Hives  . Codeine Other (See Comments)    Headache  . Sulfur Rash    Welps     Patient Measurements: Height: 5' 3"  (160 cm) Weight: 188 lb 0.8 oz (85.3 kg) IBW/kg (Calculated) : 52.4 Adjusted Body Weight: 66 kg  Vital Signs: Temp: 97.6 F (36.4 C) (02/22 0600) Temp Source: Oral (02/22 0600) BP: 134/76 mmHg (02/22 0600) Pulse Rate: 75 (02/22 0600) Intake/Output from previous day: 02/21 0701 - 02/22 0700 In: 2497.8 [I.V.:1805.3; IV Piggyback:100; TPN:572.5] Out: 1050 [Urine:1000; Drains:50] Intake/Output from this shift: Total I/O In: 10 [Other:10] Out: -   Labs:  Recent Labs  01/15/15 1920 01/16/15 0531 01/16/15 0905 01/17/15 0604  WBC 11.9* 13.3*  --  7.2  HGB 9.9* 11.0*  --  9.5*  HCT 31.2* 34.7*  --  30.2*  PLT 323 406*  --  334  INR  --   --  1.13  --      Recent Labs  01/15/15 1920 01/16/15 0531 01/16/15 2108 01/17/15 0604 01/17/15 0728 01/18/15 0513  NA 135 134*  --  134*  --  135  K 3.7 4.2  --  3.9  --  3.7  CL 100 101  --  102  --  101  CO2 29 27  --  29  --  31  GLUCOSE 112* 107*  --  90  --  134*  BUN 9 9  --  7  --  7  CREATININE 0.65 0.78  --  0.73  --  0.62  CALCIUM 8.6 8.6  --  8.1*  --  8.2*  MG 1.9  --   --   --  1.9 1.8  PHOS  --   --   --   --  3.8 3.3  PROT 6.6 7.1  --  5.7*  --  5.7*  ALBUMIN 2.1* 2.2*  --  1.7*  --  1.7*  AST 11 18  --  13  --  13  ALT 7 7  --  6  --  7  ALKPHOS 84 103  --  80  --  71  BILITOT 0.5 0.7  --  0.6  --  0.3  PREALBUMIN  --   --  <3.0*  --   --   --   TRIG  --   --   --   --   --  86   Estimated Creatinine Clearance: 61 mL/min (by C-G formula based on Cr of 0.62).    Recent Labs  01/18/15 0004 01/18/15 0557 01/18/15 0755  GLUCAP 118* 144* 132*     Insulin Requirements in the past 24 hours:  1 unit SSI  Current Nutrition:  Clinimix E 5/15 at 40 ml/hr + 20% lipids at 10 ml/hr- provides 1162kCal and 48 g of protein.  Resource Breeze 1 container TID- each supplement provides 9g protein and 250kCal  Nutritional Goals:  ~2000 kCal, ~90 grams of protein per day  Assessment: 36LPF with a colovesical fistula. She started having issues in January and was treated with several courses of antibiotics for brown urine with associated frequency and urgency. She underwent colonoscopy where fistula was found. CT revealed sigmoid diverticulitis abscess.   GI: Sigmoid diverticulitis with abscess and colovesical fistula. Percutaneous drain placed  for abscess. Plan is to repair colovesical fistula after nutrition is optimized. Her baseline prealbumin is <3 which indicates poor nutritional status. She is being trialed on clears today   Endo: hx DM (diet-controlled). CBGs 85-134 in last 24h  Lytes: K 3.7, Mag 1.8, Phos 3.3, CorCa 10  Renal: SCr stable at 0.62. NS with 20K at 60 ml/hr. On mirabegron  Pulm: 97/2L [place]  Cards: VSS- no meds  Hepatobil: alk phos, AST, ALT, and Tbili all WNL. Albumin low at 1.7. Baseline trigs 10  Neuro: A&O   ID: Diverticulitis with abscess, s/p drain placement 2/21. Flagyl and Zosyn changed to Invanz- D#2. Blood and abscess cultures both NGTD  Best Practices: Lovenox, MC TPN Access: PICC placed 2/21 TPN day#: 1 (2/21>)  Plan:  -Increase Clinimix E 5/15 to 60 ml/hr + 20% lipids at 10 ml/hr. This will provide 1502kCal and 62g of protein. Estimated goal rate is 3m/hr, however will wait final nutritional goals from RD. -multivitamin and full trace elements in TPN -continue CBGs and sensitive SSI as odered -changed IVF to plain NS and decrease rate to 40 ml/hr at 1800 when new TPN starts -BMET, mag and phos in the morning    Webster Patrone D. Grettell Ransdell, PharmD, BCPS Clinical Pharmacist Pager: [phone]/22/2016 7:59  AM

## 2015-01-18 NOTE — Progress Notes (Signed)
NP Schorr, notified concerning patient's b/p this evening.

## 2015-01-18 NOTE — Progress Notes (Signed)
PATIENT DETAILS Name: Michele Palmer Age: 79 y.o. Sex: female Date of Birth: July 31, 1936 Admit Date: 01/15/2015 Admitting Physician Hosie Poisson, MD IDP:[id number],PNTIR B., MD  Subjective: No major complaints  Assessment/Plan: Principal Problem:   Intestinal diverticular abscess with colovesical fistula: Patient presented to [facility] with the complaints of abdominal pain, was found to have diverticular abscess with colovesical fistula and transferred to [facility] for further evaluation. She was seen in consultation with general surgery and interventional radiology. Patient underwent CT-guided diverticular abscess drain placement on 2/20. She has also been evaluated by urology, plans off for eventual colectomy and repair of colo-vesical fistula. Currently maintained on IV Invanz, plans are to continue TNA for a few weeks before considering surgery. Patient remains stable, without fever or significant leukocytosis. Blood, wound cultures continue to be negative.  Active Problems:   Essential hypertension: Blood pressure remains controlled without the use of any antihypertensive medications. Was on losartan at home, resume when able    Anxiety with depression: Continue with Xanax, resume Celexa.    Hypokalemia: Repeated. Recheck electrolytes periodically.  Disposition: Remain inpatient  Antibiotics:  See below   Anti-infectives    Start     Dose/Rate Route Frequency Ordered Stop   01/17/15 2100  ertapenem (INVANZ) 1 g in sodium chloride 0.9 % 50 mL IVPB     1 g 100 mL/hr over 30 Minutes Intravenous Every 24 hours 01/17/15 2012     01/16/15 2100  metroNIDAZOLE (FLAGYL) IVPB 500 mg  Status:  Discontinued     500 mg 100 mL/hr over 60 Minutes Intravenous Every 8 hours 01/16/15 2026 01/17/15 1019   01/16/15 0315  piperacillin-tazobactam (ZOSYN) IVPB 3.375 g  Status:  Discontinued     3.375 g 12.5 mL/hr over 240 Minutes Intravenous Every 8 hours 01/16/15 0301  01/17/15 2012   01/15/15 1830  piperacillin-tazobactam (ZOSYN) IVPB 3.375 g     3.375 g 100 mL/hr over 30 Minutes Intravenous  Once 01/15/15 1807 01/15/15 2059      DVT Prophylaxis: Prophylactic Lovenox   Code Status: Full code  Family Communication Son at bedside  Procedures:  2/20-CT guided Diverticular abscess drain placement  CONSULTS:  general surgery, urology and IR  Time spent 40 minutes-which includes 50% of the time with face-to-face with patient/ family and coordinating care related to the above assessment and plan.  MEDICATIONS: Scheduled Meds: . ALPRAZolam  0.25 mg Oral BID  . antiseptic oral rinse  7 mL Mouth Rinse q12n4p  . chlorhexidine  15 mL Mouth Rinse BID  . enoxaparin (LOVENOX) injection  40 mg Subcutaneous Q24H  . ertapenem  1 g Intravenous Q24H  . feeding supplement (RESOURCE BREEZE)  1 Container Oral TID BM  . insulin aspart  0-9 Units Subcutaneous 4 times per day  . mirabegron ER  50 mg Oral Daily  . sodium chloride  10-40 mL Intracatheter Q12H  . sodium chloride  3 mL Intravenous Q12H   Continuous Infusions: . sodium chloride    . 0.9 % NaCl with KCl 20 mEq / L 60 mL/hr at 01/17/15 2333  . Marland KitchenTPN (CLINIMIX-E) Adult 40 mL/hr at 01/17/15 1803   And  . fat emulsion 240 mL (01/17/15 1803)  . Marland KitchenTPN (CLINIMIX-E) Adult     And  . fat emulsion     PRN Meds:.acetaminophen **OR** acetaminophen, HYDROmorphone (DILAUDID) injection, hyoscyamine, levalbuterol, ondansetron **OR** ondansetron (ZOFRAN) IV, sodium chloride    PHYSICAL EXAM: Vital signs in  last 24 hours: Filed Vitals:   01/17/15 1555 01/17/15 2214 01/18/15 0500 01/18/15 0600  BP: 124/65 136/65  134/76  Pulse: 85 85  75  Temp: 98.8 F (37.1 C) 98.5 F (36.9 C)  97.6 F (36.4 C)  TempSrc: Oral Oral  Oral  Resp: 18 18  18   Height:      Weight:   85.3 kg (188 lb 0.8 oz)   SpO2: 93% 96%  97%    Weight change:  Filed Weights   01/15/15 1916 01/17/15 0814 01/18/15 0500  Weight:  86.5 kg (190 lb 11.2 oz) 86.6 kg (190 lb 14.7 oz) 85.3 kg (188 lb 0.8 oz)   Body mass index is 33.32 kg/(m^2).   Gen Exam: Awake and alert with clear speech.   Neck: Supple, No JVD.   Chest: B/L Clear.   CVS: S1 S2 Regular, no murmurs.  Abdomen: soft, BS +, minimally tender in lower abd, non distended. Has drain in place. Extremities: no edema, lower extremities warm to touch. Neurologic: Non Focal.   Skin: No Rash.   Wounds: N/A.    Intake/Output from previous day:  Intake/Output Summary (Last 24 hours) at 01/18/15 1050 Last data filed at 01/18/15 0934  Gross per 24 hour  Intake 2406.17 ml  Output   1370 ml  Net 1036.17 ml     LAB RESULTS: CBC  Recent Labs Lab 01/15/15 1920 01/16/15 0531 01/17/15 0604  WBC 11.9* 13.3* 7.2  HGB 9.9* 11.0* 9.5*  HCT 31.2* 34.7* 30.2*  PLT 323 406* 334  MCV 90.4 90.6 91.2  MCH 28.7 28.7 28.7  MCHC 31.7 31.7 31.5  RDW 14.4 14.4 14.4  LYMPHSABS  --   --  2.0  MONOABS  --   --  0.9  EOSABS  --   --  0.1  BASOSABS  --   --  0.0    Chemistries   Recent Labs Lab 01/15/15 1920 01/16/15 0531 01/17/15 0604 01/17/15 0728 01/18/15 0513  NA 135 134* 134*  --  135  K 3.7 4.2 3.9  --  3.7  CL 100 101 102  --  101  CO2 29 27 29   --  31  GLUCOSE 112* 107* 90  --  134*  BUN 9 9 7   --  7  CREATININE 0.65 0.78 0.73  --  0.62  CALCIUM 8.6 8.6 8.1*  --  8.2*  MG 1.9  --   --  1.9 1.8    CBG:  Recent Labs Lab 01/17/15 1633 01/17/15 1752 01/18/15 0004 01/18/15 0557 01/18/15 0755  GLUCAP 92 85 118* 144* 132*    GFR Estimated Creatinine Clearance: 61 mL/min (by C-G formula based on Cr of 0.62).  Coagulation profile  Recent Labs Lab 01/16/15 0905  INR 1.13    Cardiac Enzymes No results for input(s): CKMB, TROPONINI, MYOGLOBIN in the last 168 hours.  Invalid input(s): CK  Invalid input(s): POCBNP No results for input(s): DDIMER in the last 72 hours.  Recent Labs  01/15/15 1920  HGBA1C 6.5*    Recent Labs   01/18/15 0513  TRIG 86   No results for input(s): TSH, T4TOTAL, T3FREE, THYROIDAB in the last 72 hours.  Invalid input(s): FREET3 No results for input(s): VITAMINB12, FOLATE, FERRITIN, TIBC, IRON, RETICCTPCT in the last 72 hours. No results for input(s): LIPASE, AMYLASE in the last 72 hours.  Urine Studies No results for input(s): UHGB, CRYS in the last 72 hours.  Invalid input(s): UACOL, UAPR, USPG, UPH, UTP,  UGL, UKET, UBIL, UNIT, UROB, ULEU, UEPI, UWBC, URBC, UBAC, CAST, UCOM, BILUA  MICROBIOLOGY: Recent Results (from the past 240 hour(s))  Culture, blood (routine x 2)     Status: None (Preliminary result)   Collection Time: 01/15/15  7:10 PM  Result Value Ref Range Status   Specimen Description BLOOD RIGHT ARM  Final   Special Requests BOTTLES DRAWN AEROBIC AND ANAEROBIC 5ML  Final   Culture   Final           BLOOD CULTURE RECEIVED NO GROWTH TO DATE CULTURE WILL BE HELD FOR 5 DAYS BEFORE ISSUING A FINAL NEGATIVE REPORT Performed at [facility]    Report Status PENDING  Incomplete  Culture, blood (routine x 2)     Status: None (Preliminary result)   Collection Time: 01/15/15  7:20 PM  Result Value Ref Range Status   Specimen Description BLOOD RIGHT HAND  Final   Special Requests BOTTLES DRAWN AEROBIC AND ANAEROBIC 5ML  Final   Culture   Final           BLOOD CULTURE RECEIVED NO GROWTH TO DATE CULTURE WILL BE HELD FOR 5 DAYS BEFORE ISSUING A FINAL NEGATIVE REPORT Performed at [facility]    Report Status PENDING  Incomplete  Anaerobic culture     Status: None (Preliminary result)   Collection Time: 01/16/15 12:09 PM  Result Value Ref Range Status   Specimen Description ABSCESS ABDOMEN  Final   Special Requests NONE  Final   Gram Stain   Final    ABUNDANT WBC PRESENT, PREDOMINANTLY PMN NO SQUAMOUS EPITHELIAL CELLS SEEN FEW GRAM NEGATIVE RODS FEW GRAM POSITIVE COCCI IN PAIRS    Culture   Final    NO ANAEROBES ISOLATED; CULTURE IN PROGRESS FOR 5  DAYS Performed at [facility]    Report Status PENDING  Incomplete  Culture, routine-abscess     Status: None (Preliminary result)   Collection Time: 01/16/15 12:09 PM  Result Value Ref Range Status   Specimen Description ABSCESS ABDOMEN  Final   Special Requests NONE  Final   Gram Stain PENDING  Incomplete   Culture   Final    Culture reincubated for better growth Performed at [facility]    Report Status PENDING  Incomplete  Culture, blood (routine x 2)     Status: None (Preliminary result)   Collection Time: 01/16/15  9:08 PM  Result Value Ref Range Status   Specimen Description BLOOD RIGHT ARM  Final   Special Requests BOTTLES DRAWN AEROBIC AND ANAEROBIC 4CC  Final   Culture   Final           BLOOD CULTURE RECEIVED NO GROWTH TO DATE CULTURE WILL BE HELD FOR 5 DAYS BEFORE ISSUING A FINAL NEGATIVE REPORT Performed at [facility]    Report Status PENDING  Incomplete  Culture, blood (routine x 2)     Status: None (Preliminary result)   Collection Time: 01/16/15  9:15 PM  Result Value Ref Range Status   Specimen Description BLOOD RIGHT HAND  Final   Special Requests BOTTLES DRAWN AEROBIC ONLY 5CC  Final   Culture   Final           BLOOD CULTURE RECEIVED NO GROWTH TO DATE CULTURE WILL BE HELD FOR 5 DAYS BEFORE ISSUING A FINAL NEGATIVE REPORT Performed at [facility]    Report Status PENDING  Incomplete    RADIOLOGY STUDIES/RESULTS: Ct Image Guided Drainage By Percutaneous Catheter  01/16/2015   CLINICAL DATA:  PELVIC DIVERTICULAR ABSCESS  EXAM: CT GUIDED DRAINAGE OF THE PELVIC DIVERTICULAR ABSCESS  ANESTHESIA/SEDATION: 1.0 Mg IV Versed 50 mcg IV Fentanyl  Total Moderate Sedation Time:  15 MINUTES  PROCEDURE: The procedure, risks, benefits, and alternatives were explained to the patient. Questions regarding the procedure were encouraged and answered. The patient understands and consents to the procedure.  The ANTERIOR PELVIC AREA was prepped  with Betadine/chlorhexidinein a sterile fashion, and a sterile drape was applied covering the operative field. A sterile gown and sterile gloves were used for the procedure. Local anesthesia was provided with 1% Lidocaine.  Previous imaging reviewed from [facility]. Patient positioned supine. Noncontrast localization CT performed. the anterior pelvic diverticular abscess over the bladder was localized. Under sterile conditions and local anesthesia, an 18 gauge introducer needle was advanced from a left oblique approach into the abscess. Needle position confirmed with CT. Guidewire inserted followed by tract dilatation to advance a 12 [place] drain. Drain catheter confirmed within the abscess by CT. Drain secured with a Prolene suture and connected to external suction bulb. Patient tolerated the drain procedure well.  COMPLICATIONS: None immediate  FINDINGS: Imaging confirms percutaneous access for drainage of the diverticular abscess.  IMPRESSION: Successful CT-guided anterior pelvic diverticular abscess drain insertion.   Electronically Signed   By: Jerilynn Mages.  Shick M.D.   On: 01/16/2015 12:46    Oren Binet, MD  [facility] Pager:[phone]  If 7PM-7AM, please contact night-coverage [email or web address] Password [email or web address] 01/18/2015, 10:50 AM   LOS: 3 days

## 2015-01-18 NOTE — Progress Notes (Signed)
INITIAL NUTRITION ASSESSMENT  DOCUMENTATION CODES Per approved criteria  -Obesity Unspecified   INTERVENTION: -Continue TPN management per pharmacy -Continue Resource Breeze po TID, each supplement provides 250 kcal and 9 grams of protein -RD will continue to follow for diet advancement  NUTRITION DIAGNOSIS: Inadequate oral intake related to altered GI function as evidenced by clear liquids, on TPN.   Goal: Pt will meet >90% of estimated nutritional needs  Monitor:  TPN adequacy, diet advancement, PO/supplement intake, labs, weight changes, I/O's  Reason for Assessment: Consult for new TPN  79 y.o. female  Admitting Dx: Intestinal diverticular abscess  79 year old female with a history of recurrent UTI, hypertension, diet-controlled diabetes presents in transfer from [facility] where she was found to have an intra-abdominal abscess. The patient has been ill for the last 1 month and have been suffering from recurrent urinary tract infections since November of last year. Over the course of the last month the patient has had suprapubic abdominal pain and fecal material in the urine  ASSESSMENT: Pt admitted with intestinal diverticular abscess.  Hx obtained pt by and daughter at bedside. Both report a general decline in health over the past 3-4 months. Pt reports appetite has been variable- at times she ate well, but other times she felt like eating nothing. She had been following a soft diet PTA. Currently, she is tolerating the clear liquid diet well. Pt consumed 100% of breakfast and lunch. She reports she has drank 2 Resource Breeze supplements and would like to continue these supplements (noted order for TID). Pt reports wt loss as well. She reports UBW of 224#. Wt hx reveals wt changes, however, not significant for time frame. It is difficult to determine if fat and muscle depletion on physical exam is related to malnutrition or decreased functional status.  She is also on  TPN- currently receiving Clinimix E 5/15 at 40 ml/hr + 20% lipids at 10 ml/hr- provides 1162kCal and 48 g of protein (which meets 65% of estimated kcal needs and  64% of estimated protein needs). Noted orders to increase Clinimix E 5/15 to 60 ml/hr + 20% lipids at 10 ml/hr at 1800. This will provide 1502kCal and 62g of protein (which meets 83% of estimated kcal needs and 83% of estimated protein needs). Per pharmacy note, estimated goal rate is 71mL/hr.  Labs reviewed. Na: 134, Calcium: 8.2. Mg, K, and Phos WDL.   Nutrition Focused Physical Exam:  Subcutaneous Fat:  Orbital Region: WDL Upper Arm Region: mild depletion Thoracic and Lumbar Region: WDL  Muscle:  Temple Region: WDL Clavicle Bone Region: WDL Clavicle and Acromion Bone Region: WDL Scapular Bone Region: WDL Dorsal Hand: WDL Patellar Region: WDL Anterior Thigh Region: mild depletion Posterior Calf Region: WDL  Edema: none present  Height: Ht Readings from Last 1 Encounters:  01/15/15 5\' 3"  (1.6 m)    Weight: Wt Readings from Last 1 Encounters:  01/18/15 188 lb 0.8 oz (85.3 kg)    Ideal Body Weight: 115#  % Ideal Body Weight: 163%  Wt Readings from Last 10 Encounters:  01/18/15 188 lb 0.8 oz (85.3 kg)  08/25/14 219 lb (99.338 kg)  02/09/14 220 lb 4.8 oz (99.927 kg)  11/10/13 213 lb 8 oz (96.843 kg)  09/22/13 211 lb 4.8 oz (95.845 kg)  09/02/13 209 lb 9.6 oz (95.074 kg)  08/19/13 210 lb 3.2 oz (95.346 kg)  08/01/13 208 lb (94.348 kg)  07/04/13 205 lb 12.8 oz (93.35 kg)    Usual Body Weight: 224#  %  Usual Body Weight: 84%  BMI:  Body mass index is 33.32 kg/(m^2). Obesity, class I  Estimated Nutritional Needs: Kcal: 1800-2000 Protein: 75-85 grams Fluid: 1.8-2.0 L  Skin: closed system drain on medial abdomen  Diet Order: TPN (CLINIMIX-E) Adult Diet clear liquid TPN (CLINIMIX-E) Adult  EDUCATION NEEDS: -Education needs addressed   Intake/Output Summary (Last 24 hours) at 01/18/15 1538 Last  data filed at 01/18/15 1114  Gross per 24 hour  Intake 2346.17 ml  Output   1720 ml  Net 626.17 ml    Last BM: 01/17/15  Labs:   Recent Labs Lab 01/15/15 1920 01/16/15 0531 01/17/15 0604 01/17/15 0728 01/18/15 0513  NA 135 134* 134*  --  135  K 3.7 4.2 3.9  --  3.7  CL 100 101 102  --  101  CO2 29 27 29   --  31  BUN 9 9 7   --  7  CREATININE 0.65 0.78 0.73  --  0.62  CALCIUM 8.6 8.6 8.1*  --  8.2*  MG 1.9  --   --  1.9 1.8  PHOS  --   --   --  3.8 3.3  GLUCOSE 112* 107* 90  --  134*    CBG (last 3)   Recent Labs  01/18/15 0557 01/18/15 0755 01/18/15 1201  GLUCAP 144* 132* 183*    Scheduled Meds: . ALPRAZolam  0.25 mg Oral BID  . antiseptic oral rinse  7 mL Mouth Rinse q12n4p  . chlorhexidine  15 mL Mouth Rinse BID  . enoxaparin (LOVENOX) injection  40 mg Subcutaneous Q24H  . ertapenem  1 g Intravenous Q24H  . feeding supplement (RESOURCE BREEZE)  1 Container Oral TID BM  . insulin aspart  0-9 Units Subcutaneous 4 times per day  . mirabegron ER  50 mg Oral Daily  . sodium chloride  10-40 mL Intracatheter Q12H  . sodium chloride  3 mL Intravenous Q12H    Continuous Infusions: . Marland KitchenTPN (CLINIMIX-E) Adult 40 mL/hr at 01/17/15 1803   And  . fat emulsion 240 mL (01/17/15 1803)  . Marland KitchenTPN (CLINIMIX-E) Adult     And  . fat emulsion      Past Medical History  Diagnosis Date  . Anemia   . Arthritis   . Cancer   . GERD (gastroesophageal reflux disease)   . Anxiety   . Depression   . Breast cancer, right breast 07/04/2013    ILC, 1.8 cm, neg sln receptor+, surgery on 08/01/13     Past Surgical History  Procedure Laterality Date  . Ovary surgery      removed ovaries  . Salpingoophorectomy    . Appendectomy  1966  . Tubal ligation    . Breast lumpectomy with needle localization and axillary sentinel lymph node bx Right 08/01/2013    Procedure: BREAST LUMPECTOMY WITH NEEDLE LOCALIZATION AND AXILLARY SENTINEL LYMPH NODE BIOPSY;  Surgeon: Haywood Lasso, MD;   Location: [place];  Service: General;  Laterality: Right;    Deven Audi A. Jimmye Norman, RD, LDN, CDE Pager: [phone] After hours Pager: [phone]

## 2015-01-19 LAB — BASIC METABOLIC PANEL
Anion gap: 3 — ABNORMAL LOW (ref 5–15)
BUN: 8 mg/dL (ref 6–23)
CALCIUM: 8.2 mg/dL — AB (ref 8.4–10.5)
CHLORIDE: 102 mmol/L (ref 96–112)
CO2: 32 mmol/L (ref 19–32)
CREATININE: 0.56 mg/dL (ref 0.50–1.10)
GFR, EST NON AFRICAN AMERICAN: 88 mL/min — AB (ref >90)
Glucose, Bld: 138 mg/dL — ABNORMAL HIGH (ref 70–99)
Potassium: 3.8 mmol/L (ref 3.5–5.1)
Sodium: 137 mmol/L (ref 135–145)

## 2015-01-19 LAB — GLUCOSE, CAPILLARY
GLUCOSE-CAPILLARY: 178 mg/dL — AB (ref 70–99)
GLUCOSE-CAPILLARY: 179 mg/dL — AB (ref 70–99)
Glucose-Capillary: 149 mg/dL — ABNORMAL HIGH (ref 70–99)
Glucose-Capillary: 164 mg/dL — ABNORMAL HIGH (ref 70–99)

## 2015-01-19 LAB — PHOSPHORUS: Phosphorus: 3.7 mg/dL (ref 2.3–4.6)

## 2015-01-19 LAB — MAGNESIUM: Magnesium: 1.9 mg/dL (ref 1.5–2.5)

## 2015-01-19 MED ORDER — TRACE MINERALS CR-CU-F-FE-I-MN-MO-SE-ZN IV SOLN
INTRAVENOUS | Status: AC
  Administered 2015-01-19: 18:00:00 via INTRAVENOUS
  Filled 2015-01-19: qty 1680

## 2015-01-19 MED ORDER — PHENAZOPYRIDINE HCL 100 MG PO TABS
100.0000 mg | ORAL_TABLET | Freq: Three times a day (TID) | ORAL | Status: DC
  Administered 2015-01-19 – 2015-01-20 (×5): 100 mg via ORAL
  Filled 2015-01-19 (×9): qty 1

## 2015-01-19 MED ORDER — FAT EMULSION 20 % IV EMUL
240.0000 mL | INTRAVENOUS | Status: AC
  Administered 2015-01-19: 240 mL via INTRAVENOUS
  Filled 2015-01-19: qty 250

## 2015-01-19 NOTE — Progress Notes (Signed)
Patient ID: Michele Palmer, female   DOB: Apr 02, 1936, 79 y.o.   MRN: [id number]    Subjective: Pt feels ok today, except having what sounds like bladder spasms.  Tolerated her clear liquids yesterday and drank all 3 resource breezes  Objective: Vital signs in last 24 hours: Temp:  [97.4 F (36.3 C)-98.4 F (36.9 C)] 98 F (36.7 C) (02/23 0602) Pulse Rate:  [73-90] 73 (02/23 0602) Resp:  [16-18] 18 (02/23 0602) BP: (72-147)/(30-75) 124/63 mmHg (02/23 0602) SpO2:  [91 %-98 %] 95 % (02/23 0602) Weight:  [193 lb 5.5 oz (87.7 kg)] 193 lb 5.5 oz (87.7 kg) (02/23 0500) Last BM Date: 01/17/15  Intake/Output from previous day: 02/22 0701 - 02/23 0700 In: 630 [P.O.:600] Out: 2490 [Urine:2450; Drains:40] Intake/Output this shift:    PE: Abd: soft, still mildly tender, JP drain with tan milky output, foley with fairly clear yellow urine this am, +BS  Lab Results:   Recent Labs  01/17/15 0604  WBC 7.2  HGB 9.5*  HCT 30.2*  PLT 334   BMET  Recent Labs  01/18/15 0513 01/19/15 0415  NA 135 137  K 3.7 3.8  CL 101 102  CO2 31 32  GLUCOSE 134* 138*  BUN 7 8  CREATININE 0.62 0.56  CALCIUM 8.2* 8.2*   PT/INR  Recent Labs  01/16/15 0905  LABPROT 14.6  INR 1.13   CMP     Component Value Date/Time   NA 137 01/19/2015 0415   NA 138 08/25/2014 1341   K 3.8 01/19/2015 0415   K 4.0 08/25/2014 1341   CL 102 01/19/2015 0415   CO2 32 01/19/2015 0415   CO2 28 08/25/2014 1341   GLUCOSE 138* 01/19/2015 0415   GLUCOSE 177* 08/25/2014 1341   BUN 8 01/19/2015 0415   BUN 20.4 08/25/2014 1341   CREATININE 0.56 01/19/2015 0415   CREATININE 0.8 08/25/2014 1341   CALCIUM 8.2* 01/19/2015 0415   CALCIUM 9.7 08/25/2014 1341   PROT 5.7* 01/18/2015 0513   PROT 7.6 08/25/2014 1341   ALBUMIN 1.7* 01/18/2015 0513   ALBUMIN 3.2* 08/25/2014 1341   AST 13 01/18/2015 0513   AST 12 08/25/2014 1341   ALT 7 01/18/2015 0513   ALT 11 08/25/2014 1341   ALKPHOS 71 01/18/2015 0513   ALKPHOS 114  08/25/2014 1341   BILITOT 0.3 01/18/2015 0513   BILITOT 0.60 08/25/2014 1341   GFRNONAA 88* 01/19/2015 0415   GFRAA >90 01/19/2015 0415   Lipase  No results found for: LIPASE     Studies/Results: No results found.  Anti-infectives: Anti-infectives    Start     Dose/Rate Route Frequency Ordered Stop   01/17/15 2100  ertapenem [facility]) 1 g in sodium chloride 0.9 % 50 mL IVPB     1 g 100 mL/hr over 30 Minutes Intravenous Every 24 hours 01/17/15 2012     01/16/15 2100  metroNIDAZOLE (FLAGYL) IVPB 500 mg  Status:  Discontinued     500 mg 100 mL/hr over 60 Minutes Intravenous Every 8 hours 01/16/15 2026 01/17/15 1019   01/16/15 0315  piperacillin-tazobactam (ZOSYN) IVPB 3.375 g  Status:  Discontinued     3.375 g 12.5 mL/hr over 240 Minutes Intravenous Every 8 hours 01/16/15 0301 01/17/15 2012   01/15/15 1830  piperacillin-tazobactam (ZOSYN) IVPB 3.375 g     3.375 g 100 mL/hr over 30 Minutes Intravenous  Once 01/15/15 1807 01/15/15 2059       Assessment/Plan   1. Diverticulitis with abscess and  colovesical fistula 2. SPCM/TNA  Plan: 1. I have had a d/w the patient and her daughter today about the possibility of pursuing a diverting loop colostomy to help divert stool and any drainage from her colovesical fistula as well as the likely fistula that is present between her colon and her abscess cavity.  This may help to allow her chronic infections to resolve and also maybe allow her to eat better, which will increase her nutritional value as well.  The patient and daughter would be agreeable to this if we wanted to proceed with this plan. 2. Cont TNA for now, along with breeze. 3. Hold on clear liquids.  LOS: 4 days    Crystal Ellwood E 01/19/2015, 8:17 AM Pager: [phone]

## 2015-01-19 NOTE — Progress Notes (Signed)
PATIENT DETAILS Name: Michele Palmer Age: 79 y.o. Sex: female Date of Birth: 02-25-1936 Admit Date: 01/15/2015 Admitting Physician Hosie Poisson, MD GBT:DVVO,HYWVP B., MD  Subjective: No major complaints. Complains of "bladder spasm"  Assessment/Plan: Principal Problem:   Intestinal diverticular abscess with colovesical fistula: Patient presented to [facility] with the complaints of abdominal pain, was found to have diverticular abscess with colovesical fistula and transferred to [facility] for further evaluation. She was seen in consultation with general surgery and interventional radiology. Patient underwent CT-guided diverticular abscess drain placement on 2/20. She has also been evaluated by urology, plans off for eventual colectomy and repair of colo-vesical fistula. However, surgery now contemplating a diverting colostomy to help diverge stool from colocvesical fistula and from colo-abscess fustula.she will need definite surgery in the form of colectomy in the next few weeks. Currently maintained on IV Invanz, plans are to continue TNA for a few weeks before colectomy can be considered. Patient remains stable, without fever or significant leukocytosis. Blood, wound cultures continue to be negative.  Active Problems:   Essential hypertension: Blood pressure remains controlled without the use of any antihypertensive medications. Was on losartan at home, resume when able    Anxiety with depression: Continue with Xanax, and Celexa.    Hypokalemia: Repeated. Recheck electrolytes periodically.  Disposition: Remain inpatient-Home with HHPT vs SNF when ok with surgery  Antibiotics:  See below   Anti-infectives    Start     Dose/Rate Route Frequency Ordered Stop   01/17/15 2100  ertapenem (INVANZ) 1 g in sodium chloride 0.9 % 50 mL IVPB     1 g 100 mL/hr over 30 Minutes Intravenous Every 24 hours 01/17/15 2012     01/16/15 2100  metroNIDAZOLE (FLAGYL) IVPB 500 mg   Status:  Discontinued     500 mg 100 mL/hr over 60 Minutes Intravenous Every 8 hours 01/16/15 2026 01/17/15 1019   01/16/15 0315  piperacillin-tazobactam (ZOSYN) IVPB 3.375 g  Status:  Discontinued     3.375 g 12.5 mL/hr over 240 Minutes Intravenous Every 8 hours 01/16/15 0301 01/17/15 2012   01/15/15 1830  piperacillin-tazobactam (ZOSYN) IVPB 3.375 g     3.375 g 100 mL/hr over 30 Minutes Intravenous  Once 01/15/15 1807 01/15/15 2059      DVT Prophylaxis: Prophylactic Lovenox   Code Status: Full code  Family Communication Son at bedside  Procedures:  2/20-CT guided Diverticular abscess drain placement  CONSULTS:  general surgery, urology and IR   MEDICATIONS: Scheduled Meds: . ALPRAZolam  0.25 mg Oral BID  . antiseptic oral rinse  7 mL Mouth Rinse q12n4p  . chlorhexidine  15 mL Mouth Rinse BID  . enoxaparin (LOVENOX) injection  40 mg Subcutaneous Q24H  . ertapenem  1 g Intravenous Q24H  . feeding supplement (RESOURCE BREEZE)  1 Container Oral TID BM  . insulin aspart  0-9 Units Subcutaneous 4 times per day  . mirabegron ER  50 mg Oral Daily  . phenazopyridine  100 mg Oral TID WC  . sodium chloride  10-40 mL Intracatheter Q12H  . sodium chloride  3 mL Intravenous Q12H   Continuous Infusions: . Marland KitchenTPN (CLINIMIX-E) Adult 60 mL/hr at 01/18/15 1750   And  . fat emulsion 240 mL (01/18/15 1750)  . Marland KitchenTPN (CLINIMIX-E) Adult     And  . fat emulsion     PRN Meds:.acetaminophen **OR** acetaminophen, HYDROmorphone (DILAUDID) injection, hyoscyamine, levalbuterol, ondansetron **OR** ondansetron (ZOFRAN) IV, sodium  chloride    PHYSICAL EXAM: Vital signs in last 24 hours: Filed Vitals:   01/18/15 2130 01/18/15 2206 01/19/15 0500 01/19/15 0602  BP: 72/30 100/47  124/63  Pulse: 90 88  73  Temp: 98.4 F (36.9 C)   98 F (36.7 C)  TempSrc: Oral   Oral  Resp: 18   18  Height:      Weight:   87.7 kg (193 lb 5.5 oz)   SpO2: 91% 96%  95%    Weight change: 1.1 kg (2 lb 6.8  oz) Filed Weights   01/17/15 0814 01/18/15 0500 01/19/15 0500  Weight: 86.6 kg (190 lb 14.7 oz) 85.3 kg (188 lb 0.8 oz) 87.7 kg (193 lb 5.5 oz)   Body mass index is 34.26 kg/(m^2).   Gen Exam: Awake and alert with clear speech.   Neck: Supple, No JVD.   Chest: B/L Clear.   CVS: S1 S2 Regular, no murmurs.  Abdomen: soft, BS +, minimally tender in lower abd, non distended. Has drain in place. Extremities: no edema, lower extremities warm to touch. Neurologic: Non Focal.   Skin: No Rash.   Wounds: N/A.    Intake/Output from previous day:  Intake/Output Summary (Last 24 hours) at 01/19/15 1125 Last data filed at 01/19/15 0945  Gross per 24 hour  Intake    980 ml  Output   1790 ml  Net   -810 ml     LAB RESULTS: CBC  Recent Labs Lab 01/15/15 1920 01/16/15 0531 01/17/15 0604  WBC 11.9* 13.3* 7.2  HGB 9.9* 11.0* 9.5*  HCT 31.2* 34.7* 30.2*  PLT 323 406* 334  MCV 90.4 90.6 91.2  MCH 28.7 28.7 28.7  MCHC 31.7 31.7 31.5  RDW 14.4 14.4 14.4  LYMPHSABS  --   --  2.0  MONOABS  --   --  0.9  EOSABS  --   --  0.1  BASOSABS  --   --  0.0    Chemistries   Recent Labs Lab 01/15/15 1920 01/16/15 0531 01/17/15 0604 01/17/15 0728 01/18/15 0513 01/19/15 0415  NA 135 134* 134*  --  135 137  K 3.7 4.2 3.9  --  3.7 3.8  CL 100 101 102  --  101 102  CO2 29 27 29   --  31 32  GLUCOSE 112* 107* 90  --  134* 138*  BUN 9 9 7   --  7 8  CREATININE 0.65 0.78 0.73  --  0.62 0.56  CALCIUM 8.6 8.6 8.1*  --  8.2* 8.2*  MG 1.9  --   --  1.9 1.8 1.9    CBG:  Recent Labs Lab 01/18/15 0755 01/18/15 1201 01/18/15 1829 01/19/15 0013 01/19/15 0558  GLUCAP 132* 183* 205* 164* 149*    GFR Estimated Creatinine Clearance: 61.8 mL/min (by C-G formula based on Cr of 0.56).  Coagulation profile  Recent Labs Lab 01/16/15 0905  INR 1.13    Cardiac Enzymes No results for input(s): CKMB, TROPONINI, MYOGLOBIN in the last 168 hours.  Invalid input(s): CK  Invalid input(s):  POCBNP No results for input(s): DDIMER in the last 72 hours. No results for input(s): HGBA1C in the last 72 hours.  Recent Labs  01/18/15 0513  TRIG 86   No results for input(s): TSH, T4TOTAL, T3FREE, THYROIDAB in the last 72 hours.  Invalid input(s): FREET3 No results for input(s): VITAMINB12, FOLATE, FERRITIN, TIBC, IRON, RETICCTPCT in the last 72 hours. No results for input(s): LIPASE, AMYLASE in  the last 72 hours.  Urine Studies No results for input(s): UHGB, CRYS in the last 72 hours.  Invalid input(s): UACOL, UAPR, USPG, UPH, UTP, UGL, UKET, UBIL, UNIT, UROB, ULEU, UEPI, UWBC, URBC, UBAC, CAST, UCOM, BILUA  MICROBIOLOGY: Recent Results (from the past 240 hour(s))  Culture, blood (routine x 2)     Status: None (Preliminary result)   Collection Time: 01/15/15  7:10 PM  Result Value Ref Range Status   Specimen Description BLOOD RIGHT ARM  Final   Special Requests BOTTLES DRAWN AEROBIC AND ANAEROBIC 5ML  Final   Culture   Final           BLOOD CULTURE RECEIVED NO GROWTH TO DATE CULTURE WILL BE HELD FOR 5 DAYS BEFORE ISSUING A FINAL NEGATIVE REPORT Performed at [facility]    Report Status PENDING  Incomplete  Culture, blood (routine x 2)     Status: None (Preliminary result)   Collection Time: 01/15/15  7:20 PM  Result Value Ref Range Status   Specimen Description BLOOD RIGHT HAND  Final   Special Requests BOTTLES DRAWN AEROBIC AND ANAEROBIC 5ML  Final   Culture   Final           BLOOD CULTURE RECEIVED NO GROWTH TO DATE CULTURE WILL BE HELD FOR 5 DAYS BEFORE ISSUING A FINAL NEGATIVE REPORT Performed at [facility]    Report Status PENDING  Incomplete  Anaerobic culture     Status: None (Preliminary result)   Collection Time: 01/16/15 12:09 PM  Result Value Ref Range Status   Specimen Description ABSCESS ABDOMEN  Final   Special Requests NONE  Final   Gram Stain   Final    ABUNDANT WBC PRESENT, PREDOMINANTLY PMN NO SQUAMOUS EPITHELIAL CELLS  SEEN FEW GRAM NEGATIVE RODS FEW GRAM POSITIVE COCCI IN PAIRS    Culture   Final    NO ANAEROBES ISOLATED; CULTURE IN PROGRESS FOR 5 DAYS Performed at [facility]    Report Status PENDING  Incomplete  Culture, routine-abscess     Status: None (Preliminary result)   Collection Time: 01/16/15 12:09 PM  Result Value Ref Range Status   Specimen Description ABSCESS ABDOMEN  Final   Special Requests NONE  Final   Gram Stain   Final    ABUNDANT WBC PRESENT, PREDOMINANTLY PMN NO SQUAMOUS EPITHELIAL CELLS SEEN FEW GRAM NEGATIVE RODS FEW GRAM POSITIVE COCCI IN PAIRS    Culture   Final    MULTIPLE ORGANISMS PRESENT, NONE PREDOMINANT Performed at [facility]    Report Status PENDING  Incomplete  Culture, blood (routine x 2)     Status: None (Preliminary result)   Collection Time: 01/16/15  9:08 PM  Result Value Ref Range Status   Specimen Description BLOOD RIGHT ARM  Final   Special Requests BOTTLES DRAWN AEROBIC AND ANAEROBIC 4CC  Final   Culture   Final           BLOOD CULTURE RECEIVED NO GROWTH TO DATE CULTURE WILL BE HELD FOR 5 DAYS BEFORE ISSUING A FINAL NEGATIVE REPORT Performed at [facility]    Report Status PENDING  Incomplete  Culture, blood (routine x 2)     Status: None (Preliminary result)   Collection Time: 01/16/15  9:15 PM  Result Value Ref Range Status   Specimen Description BLOOD RIGHT HAND  Final   Special Requests BOTTLES DRAWN AEROBIC ONLY 5CC  Final   Culture   Final  BLOOD CULTURE RECEIVED NO GROWTH TO DATE CULTURE WILL BE HELD FOR 5 DAYS BEFORE ISSUING A FINAL NEGATIVE REPORT Performed at [facility]    Report Status PENDING  Incomplete  Culture, Urine     Status: None   Collection Time: 01/16/15  9:32 PM  Result Value Ref Range Status   Specimen Description URINE, RANDOM  Final   Special Requests NONE  Final   Colony Count NO GROWTH Performed at [facility]   Final   Culture NO GROWTH Performed  at [facility]   Final   Report Status 01/18/2015 FINAL  Final    RADIOLOGY STUDIES/RESULTS: Ct Image Guided Drainage By Percutaneous Catheter  01/16/2015   CLINICAL DATA:  PELVIC DIVERTICULAR ABSCESS  EXAM: CT GUIDED DRAINAGE OF THE PELVIC DIVERTICULAR ABSCESS  ANESTHESIA/SEDATION: 1.0 Mg IV Versed 50 mcg IV Fentanyl  Total Moderate Sedation Time:  15 MINUTES  PROCEDURE: The procedure, risks, benefits, and alternatives were explained to the patient. Questions regarding the procedure were encouraged and answered. The patient understands and consents to the procedure.  The ANTERIOR PELVIC AREA was prepped with Betadine/chlorhexidinein a sterile fashion, and a sterile drape was applied covering the operative field. A sterile gown and sterile gloves were used for the procedure. Local anesthesia was provided with 1% Lidocaine.  Previous imaging reviewed from [facility]. Patient positioned supine. Noncontrast localization CT performed. the anterior pelvic diverticular abscess over the bladder was localized. Under sterile conditions and local anesthesia, an 18 gauge introducer needle was advanced from a left oblique approach into the abscess. Needle position confirmed with CT. Guidewire inserted followed by tract dilatation to advance a 12 [place] drain. Drain catheter confirmed within the abscess by CT. Drain secured with a Prolene suture and connected to external suction bulb. Patient tolerated the drain procedure well.  COMPLICATIONS: None immediate  FINDINGS: Imaging confirms percutaneous access for drainage of the diverticular abscess.  IMPRESSION: Successful CT-guided anterior pelvic diverticular abscess drain insertion.   Electronically Signed   By: Jerilynn Mages.  Shick M.D.   On: 01/16/2015 12:46    Oren Binet, MD  [facility] Pager:[phone]  If 7PM-7AM, please contact night-coverage [email or web address] Password [facility] 01/19/2015, 11:25 AM   LOS: 4 days

## 2015-01-19 NOTE — Care Management Note (Addendum)
    Page 1 of 2   02/02/2015     4:49:36 PM CARE MANAGEMENT NOTE 02/02/2015  Patient:  Michele Palmer, Michele Palmer   Account Number:  [id number]  Date Initiated:  01/19/2015  Documentation initiated by:  Tomi Bamberger  Subjective/Objective Assessment:   dx divertucilar abscess  admit- from home.     Action/Plan:   pt /ot eval- hhpt   Anticipated DC Date:  01/30/2015   Anticipated DC Plan:  [place]  In-house referral  Clinical Social Worker      DC Forensic scientist  CM consult      [facility] Choice  HOME HEALTH   Choice offered to / List presented to:  C-1 Patient        [place] arranged  HH-1 RN  [place].   Status of service:  In process, will continue to follow Medicare Important Message given?  YES (If response is "NO", the following Medicare IM given date fields will be blank) Date Medicare IM given:  01/18/2015 Medicare IM given by:  Tomi Bamberger Date Additional Medicare IM given:  02/01/2015 Additional Medicare IM given by:  Tomi Bamberger  Discharge Disposition:  [place]  Per UR Regulation:  Reviewed for med. necessity/level of care/duration of stay  If discussed at [place] of Stay Meetings, dates discussed:    Comments:  01/29/15 [place], BSN [phone] patient had digitalization of colostomy yesterday with no results, today she will receive an enema through colostomy.  01/26/15 [place] BSN [phone] patient is s/p diverting loop colostomy, TPN is being weaned by pharmacy, patient on calorie count.  Conts on iv abx.  NCM will cont to follow for dc needs.  01/20/15 [place] ,BSN [phone] patient is for diverting colostomy tomorrow, surgery following.  01/09/15 [place] [phone] patient from home, per pt eval rec hhpt,  patient chose [facility] for [facility] [place].  Referral made to [facility], [place] notified.  Soc will begin 24-48 hrs post dc.

## 2015-01-19 NOTE — Progress Notes (Signed)
PARENTERAL NUTRITION CONSULT NOTE - FOLLOW UP  Pharmacy Consult for TPN Indication: Diverticulitis with abscess, colovesical fistula  Allergies  Allergen Reactions  . Aspirin Other (See Comments)    Makes me "shaky"  . Ciprofloxacin Hives  . Codeine Other (See Comments)    Headache  . Sulfur Rash    [place]     Patient Measurements: Height: 5' 3"  (160 cm) Weight: 193 lb 5.5 oz (87.7 kg) IBW/kg (Calculated) : 52.4 Adjusted Body Weight: 66 kg  Vital Signs: Temp: 98 F (36.7 C) (02/23 0602) Temp Source: Oral (02/23 0602) BP: 124/63 mmHg (02/23 0602) Pulse Rate: 73 (02/23 0602) Intake/Output from previous day: 02/22 0701 - 02/23 0700 In: 630 [P.O.:600] Out: 2490 [Urine:2450; Drains:40] Intake/Output from this shift:    Labs:  Recent Labs  01/17/15 0604  WBC 7.2  HGB 9.5*  HCT 30.2*  PLT 334     Recent Labs  01/16/15 2108 01/17/15 0604 01/17/15 0728 01/18/15 0513 01/19/15 0415  NA  --  134*  --  135 137  K  --  3.9  --  3.7 3.8  CL  --  102  --  101 102  CO2  --  29  --  31 32  GLUCOSE  --  90  --  134* 138*  BUN  --  7  --  7 8  CREATININE  --  0.73  --  0.62 0.56  CALCIUM  --  8.1*  --  8.2* 8.2*  MG  --   --  1.9 1.8 1.9  PHOS  --   --  3.8 3.3 3.7  PROT  --  5.7*  --  5.7*  --   ALBUMIN  --  1.7*  --  1.7*  --   AST  --  13  --  13  --   ALT  --  6  --  7  --   ALKPHOS  --  80  --  71  --   BILITOT  --  0.6  --  0.3  --   PREALBUMIN <3.0*  --   --  3.4*  --   TRIG  --   --   --  86  --    Estimated Creatinine Clearance: 61.8 mL/min (by C-G formula based on Cr of 0.56).    Recent Labs  01/18/15 1829 01/19/15 0013 01/19/15 0558  GLUCAP 205* 164* 149*    Insulin Requirements in the past 24 hours:  8 units SSI  Current Nutrition:  Clinimix E 5/15 at 60 ml/hr + 20% lipids at 10 ml/hr- provides 1502kCal and 62g of protein Resource Breeze 1 container TID- each supplement provides 9g protein and 250kCal  Nutritional Goals:  1800-2000  kCal, 75-85 grams of protein per day as per RD recommendations 2/22  Assessment: 18XWR with a colovesical fistula. She started having issues in January and was treated with several courses of antibiotics for brown urine with associated frequency and urgency. She underwent colonoscopy where fistula was found. CT revealed sigmoid diverticulitis abscess.   GI: Sigmoid diverticulitis with abscess and colovesical fistula. Percutaneous drain placed for abscess. She tolerated clears yesterday and had all 3 Resource Breeze supplements. Plan per surgery is to pursue a diverting loop colostomy to help with resolution of infections and hopefully allow her to eat better. Plans for full resection/repair in the future when her nutritional status improves (baseline prealbumin <3, repeat yesterday still low at 3.4)  Endo: hx DM (diet-controlled). CBGs 132-205  in last 24h  Lytes: K 3.8, Mag 1.9, Phos 3.7, CorCa 10- she is not exhibiting signs of refeeding  Renal: SCr stable at 0.56. On mirabegron + Pyridium. UOP 1.3m/kg/hr  Pulm: 97/2L [place]  Cards: VSS- no meds  Hepatobil: alk phos, AST, ALT, and Tbili all WNL. Albumin low at 1.7. Baseline trigs 832 Neuro: A&O   ID: Diverticulitis with abscess, s/p drain placement 2/21. Flagyl and Zosyn changed to Invanz- D#3. Blood and abscess cultures both NGTD, urine culture negative  Best Practices: Lovenox, MC TPN Access: PICC placed 2/21 TPN day#: 2 (2/21>)  Plan:  -change Clinimix to E 5/20 at 794mhr + 20% IVFE at 103mr-- this is goal rate which provides 84g protein and 1958kCal daily which meets 100% of goals as per RD recommendations -multivitamin and full trace elements in TPN -continue CBGs and sensitive SSI as ordered- will monitor CBGs closely, may need to add insulin to TPN bag if CBGs continue to be elevated -BMET, mag and phos in the morning    Maribell Demeo D. Parnika Tweten, PharmD, BCPS Clinical Pharmacist Pager: [phone] 9:21 AM

## 2015-01-20 LAB — GLUCOSE, CAPILLARY
Glucose-Capillary: 200 mg/dL — ABNORMAL HIGH (ref 70–99)
Glucose-Capillary: 185 mg/dL — ABNORMAL HIGH (ref 70–99)

## 2015-01-20 LAB — BASIC METABOLIC PANEL
Anion gap: 1 — ABNORMAL LOW (ref 5–15)
BUN: 11 mg/dL (ref 6–23)
CHLORIDE: 104 mmol/L (ref 96–112)
CO2: 29 mmol/L (ref 19–32)
Calcium: 8.1 mg/dL — ABNORMAL LOW (ref 8.4–10.5)
Creatinine, Ser: 0.47 mg/dL — ABNORMAL LOW (ref 0.50–1.10)
GFR calc Af Amer: >90 mL/min (ref >90)
GLUCOSE: 167 mg/dL — AB (ref 70–99)
Potassium: 3.8 mmol/L (ref 3.5–5.1)
SODIUM: 134 mmol/L — AB (ref 135–145)

## 2015-01-20 LAB — SURGICAL PCR SCREEN
MRSA, PCR: NEGATIVE
Staphylococcus aureus: NEGATIVE

## 2015-01-20 LAB — CULTURE, ROUTINE-ABSCESS

## 2015-01-20 LAB — PHOSPHORUS: PHOSPHORUS: 3.4 mg/dL (ref 2.3–4.6)

## 2015-01-20 LAB — MAGNESIUM: MAGNESIUM: 1.8 mg/dL (ref 1.5–2.5)

## 2015-01-20 MED ORDER — ALUM & MAG HYDROXIDE-SIMETH 200-200-20 MG/5ML PO SUSP
15.0000 mL | ORAL | Status: DC | PRN
  Filled 2015-01-20: qty 30

## 2015-01-20 MED ORDER — FAT EMULSION 20 % IV EMUL
240.0000 mL | INTRAVENOUS | Status: AC
  Administered 2015-01-20: 240 mL via INTRAVENOUS
  Administered 2015-01-21: 120 mL via INTRAVENOUS
  Filled 2015-01-20: qty 250

## 2015-01-20 MED ORDER — TRACE MINERALS CR-CU-F-FE-I-MN-MO-SE-ZN IV SOLN
INTRAVENOUS | Status: AC
  Administered 2015-01-20: 19:00:00 via INTRAVENOUS
  Filled 2015-01-20: qty 1680

## 2015-01-20 MED ORDER — ALUM & MAG HYDROXIDE-SIMETH 200-200-20 MG/5ML PO SUSP
30.0000 mL | ORAL | Status: DC | PRN
  Administered 2015-01-20 – 2015-01-23 (×2): 30 mL via ORAL
  Filled 2015-01-20: qty 30

## 2015-01-20 NOTE — Progress Notes (Signed)
Patient ID: Michele Palmer, female   DOB: [birth year], 79 y.o.   MRN: [id number]     [facility]      [address] [place], [place] [address]    Phone: [phone] FAX: [phone]     Subjective: Mild pain.  Still has feculent drainage in foley.  Purulent drainage in drain.    Objective:  Vital signs:  Filed Vitals:   01/19/15 2115 01/20/15 0539 01/20/15 0539 01/20/15 0654  BP: 119/65  114/62   Pulse: 89  79   Temp: 98.3 F (36.8 C)   97.7 F (36.5 C)  TempSrc: Oral   Oral  Resp: 18  20   Height:      Weight:  195 lb 12.3 oz (88.8 kg)    SpO2: 95%  97%     Last BM Date: 01/17/15  Intake/Output   Yesterday:  02/23 0701 - 02/24 0700 In: 1517 [P.O.:600; TPN:840] Out: 6160 [Urine:3300; Drains:30] This shift:  Total I/O In: 130 [P.O.:120; Other:10] Out: 375 [Urine:375]  Physical Exam: General: Pt awake/alert/oriented x4 in no acute distress Abdomen: Soft.  Nondistended.  TTP pelvic region, LLQ, drain with purulent drainage.   No evidence of peritonitis.  No incarcerated hernias. GU: foley has stool    Problem List:   Principal Problem:   Intestinal diverticular abscess Active Problems:   Essential hypertension   Hypokalemia   Abdominal abscess   Colonic diverticular abscess   Anxiety state   Depression    Results:   Labs: Results for orders placed or performed during the hospital encounter of 01/15/15 (from the past 48 hour(s))  Glucose, capillary     Status: Abnormal   Collection Time: 01/18/15 12:01 PM  Result Value Ref Range   Glucose-Capillary 183 (H) 70 - 99 mg/dL  Glucose, capillary     Status: Abnormal   Collection Time: 01/18/15  6:29 PM  Result Value Ref Range   Glucose-Capillary 205 (H) 70 - 99 mg/dL  Glucose, capillary     Status: Abnormal   Collection Time: 01/19/15 12:13 AM  Result Value Ref Range   Glucose-Capillary 164 (H) 70 - 99 mg/dL  Basic metabolic panel     Status: Abnormal   Collection Time: 01/19/15  4:15 AM  Result Value Ref Range   Sodium 137 135 - 145 mmol/L   Potassium 3.8 3.5 - 5.1 mmol/L   Chloride 102 96 - 112 mmol/L   CO2 32 19 - 32 mmol/L   Glucose, Bld 138 (H) 70 - 99 mg/dL   BUN 8 6 - 23 mg/dL   Creatinine, Ser 0.56 0.50 - 1.10 mg/dL   Calcium 8.2 (L) 8.4 - 10.5 mg/dL   GFR calc non Af Amer 88 (L) >90 mL/min   GFR calc Af Amer >90 >90 mL/min    Comment: (NOTE) The eGFR has been calculated using the CKD EPI equation. This calculation has not been validated in all clinical situations. eGFR's persistently <90 mL/min signify possible Chronic Kidney Disease.    Anion gap 3 (L) 5 - 15  Magnesium     Status: None   Collection Time: 01/19/15  4:15 AM  Result Value Ref Range   Magnesium 1.9 1.5 - 2.5 mg/dL  Phosphorus     Status: None   Collection Time: 01/19/15  4:15 AM  Result Value Ref Range   Phosphorus 3.7 2.3 - 4.6 mg/dL  Glucose, capillary     Status: Abnormal   Collection  Time: 01/19/15  5:58 AM  Result Value Ref Range   Glucose-Capillary 149 (H) 70 - 99 mg/dL  Glucose, capillary     Status: Abnormal   Collection Time: 01/19/15 12:24 PM  Result Value Ref Range   Glucose-Capillary 178 (H) 70 - 99 mg/dL  Glucose, capillary     Status: Abnormal   Collection Time: 01/19/15  5:39 PM  Result Value Ref Range   Glucose-Capillary 179 (H) 70 - 99 mg/dL  Basic metabolic panel     Status: Abnormal   Collection Time: 01/20/15  5:00 AM  Result Value Ref Range   Sodium 134 (L) 135 - 145 mmol/L   Potassium 3.8 3.5 - 5.1 mmol/L   Chloride 104 96 - 112 mmol/L   CO2 29 19 - 32 mmol/L   Glucose, Bld 167 (H) 70 - 99 mg/dL   BUN 11 6 - 23 mg/dL   Creatinine, Ser 0.47 (L) 0.50 - 1.10 mg/dL   Calcium 8.1 (L) 8.4 - 10.5 mg/dL   GFR calc non Af Amer >90 >90 mL/min   GFR calc Af Amer >90 >90 mL/min    Comment: (NOTE) The eGFR has been calculated using the CKD EPI equation. This calculation has not been validated in all clinical situations. eGFR's  persistently <90 mL/min signify possible Chronic Kidney Disease.    Anion gap 1 (L) 5 - 15    Comment: REPEATED TO VERIFY  Magnesium     Status: None   Collection Time: 01/20/15  5:00 AM  Result Value Ref Range   Magnesium 1.8 1.5 - 2.5 mg/dL  Phosphorus     Status: None   Collection Time: 01/20/15  5:00 AM  Result Value Ref Range   Phosphorus 3.4 2.3 - 4.6 mg/dL    Imaging / Studies: No results found.  Medications / Allergies:  Scheduled Meds: . ALPRAZolam  0.25 mg Oral BID  . antiseptic oral rinse  7 mL Mouth Rinse q12n4p  . chlorhexidine  15 mL Mouth Rinse BID  . enoxaparin (LOVENOX) injection  40 mg Subcutaneous Q24H  . ertapenem  1 g Intravenous Q24H  . feeding supplement (RESOURCE BREEZE)  1 Container Oral TID BM  . insulin aspart  0-9 Units Subcutaneous 4 times per day  . mirabegron ER  50 mg Oral Daily  . phenazopyridine  100 mg Oral TID WC  . sodium chloride  10-40 mL Intracatheter Q12H  . sodium chloride  3 mL Intravenous Q12H   Continuous Infusions: . Marland KitchenTPN (CLINIMIX-E) Adult 70 mL/hr at 01/19/15 1742   And  . fat emulsion 240 mL (01/19/15 1742)  . Marland KitchenTPN (CLINIMIX-E) Adult     And  . fat emulsion     PRN Meds:.acetaminophen **OR** acetaminophen, HYDROmorphone (DILAUDID) injection, hyoscyamine, levalbuterol, ondansetron **OR** ondansetron (ZOFRAN) IV, sodium chloride  Antibiotics: Anti-infectives    Start     Dose/Rate Route Frequency Ordered Stop   01/17/15 2100  ertapenem (INVANZ) 1 g in sodium chloride 0.9 % 50 mL IVPB     1 g 100 mL/hr over 30 Minutes Intravenous Every 24 hours 01/17/15 2012     01/16/15 2100  metroNIDAZOLE (FLAGYL) IVPB 500 mg  Status:  Discontinued     500 mg 100 mL/hr over 60 Minutes Intravenous Every 8 hours 01/16/15 2026 01/17/15 1019   01/16/15 0315  piperacillin-tazobactam (ZOSYN) IVPB 3.375 g  Status:  Discontinued     3.375 g 12.5 mL/hr over 240 Minutes Intravenous Every 8 hours 01/16/15 0301 01/17/15 2012  01/15/15 1830   piperacillin-tazobactam (ZOSYN) IVPB 3.375 g     3.375 g 100 mL/hr over 30 Minutes Intravenous  Once 01/15/15 1807 01/15/15 2059       Assessment/Plan Diverticulitis with abscess and colovesical fistula -will proceed with a diverting loop colostomy tomorrow around 9:30AM -WOC consult to marking -clears, NPO after midnight -continue Invanz 2/21---> -SCD/lovenox -mobilize -CBC in AM -continue with foley -continue drain care -pain control PCM -TPN  Erby Pian, ANP-BC [facility] Pager [phone](7A-4:30P) For consults and floor pages call [phone](7A-4:30P)  01/20/2015 10:26 AM

## 2015-01-20 NOTE — Consult Note (Signed)
WOC requested for preoperative stoma site marking  Discussed surgical procedure and stoma creation with patient. Explained role of the [place] nurse team.  Provided the patient with educational booklet/DVD and provided samples of pouching options.  Answered patient questions.   Examined patient lying and sitting in order to place the marking in the patient's visual field, away from any creases or abdominal contour issues and within the rectus muscle.  Attempted to mark below the patient's belt line.    Marked in the LLQ, marked large abdominal crease to avoid, will need to possibly remove stat lock for ostomy placement.    [place] team will follow up with patient after surgery for continue ostomy care and teaching.  Jazmon Kos [place] RN,CWOCN [phone]

## 2015-01-20 NOTE — Progress Notes (Signed)
PATIENT DETAILS Name: Michele Palmer Age: 79 y.o. Sex: female Date of Birth: 12-01-1935 Admit Date: 01/15/2015 Admitting Physician Hosie Poisson, MD JJK:KXFG,HWEXH B., MD  Subjective: No major complaints. Son at bedside  Assessment/Plan: Principal Problem:   Intestinal diverticular abscess with colovesical fistula: Patient presented to [facility] with the complaints of abdominal pain, was found to have diverticular abscess with colovesical fistula and transferred to [facility] for further evaluation. She was seen in consultation with general surgery and interventional radiology. Patient underwent CT-guided diverticular abscess drain placement on 2/20. She has also been evaluated by urology, plans off for eventual colectomy and repair of colo-vesical fistula. However, surgery now planning a diverting colostomy on 2/25 to help divert stool from colocvesical fistula and from colo-abscess fustula.She will need definite surgery in the form of colectomy in the next few weeks. Currently maintained on IV Invanz, plans are to continue TNA for a few weeks before colectomy can be considered. Patient remains stable, without fever or significant leukocytosis. Blood, wound cultures continue to be negative.  Active Problems:   Essential hypertension: Blood pressure remains controlled without the use of any antihypertensive medications. Was on losartan at home, resume when able    Anxiety with depression: Continue with Xanax, and Celexa.    Hypokalemia: Repeated. Recheck electrolytes periodically.  Disposition: Remain inpatient-Home with HHPT vs SNF when ok with surgery  Antibiotics:  See below   Anti-infectives    Start     Dose/Rate Route Frequency Ordered Stop   01/17/15 2100  ertapenem (INVANZ) 1 g in sodium chloride 0.9 % 50 mL IVPB     1 g 100 mL/hr over 30 Minutes Intravenous Every 24 hours 01/17/15 2012     01/16/15 2100  metroNIDAZOLE (FLAGYL) IVPB 500 mg  Status:   Discontinued     500 mg 100 mL/hr over 60 Minutes Intravenous Every 8 hours 01/16/15 2026 01/17/15 1019   01/16/15 0315  piperacillin-tazobactam (ZOSYN) IVPB 3.375 g  Status:  Discontinued     3.375 g 12.5 mL/hr over 240 Minutes Intravenous Every 8 hours 01/16/15 0301 01/17/15 2012   01/15/15 1830  piperacillin-tazobactam (ZOSYN) IVPB 3.375 g     3.375 g 100 mL/hr over 30 Minutes Intravenous  Once 01/15/15 1807 01/15/15 2059      DVT Prophylaxis: Prophylactic Lovenox   Code Status: Full code  Family Communication Son at bedside  Procedures:  2/20-CT guided Diverticular abscess drain placement  CONSULTS:  general surgery, urology and IR   MEDICATIONS: Scheduled Meds: . ALPRAZolam  0.25 mg Oral BID  . antiseptic oral rinse  7 mL Mouth Rinse q12n4p  . chlorhexidine  15 mL Mouth Rinse BID  . enoxaparin (LOVENOX) injection  40 mg Subcutaneous Q24H  . ertapenem  1 g Intravenous Q24H  . feeding supplement (RESOURCE BREEZE)  1 Container Oral TID BM  . insulin aspart  0-9 Units Subcutaneous 4 times per day  . mirabegron ER  50 mg Oral Daily  . phenazopyridine  100 mg Oral TID WC  . sodium chloride  10-40 mL Intracatheter Q12H  . sodium chloride  3 mL Intravenous Q12H   Continuous Infusions: . Marland KitchenTPN (CLINIMIX-E) Adult 70 mL/hr at 01/19/15 1742   And  . fat emulsion 240 mL (01/19/15 1742)  . Marland KitchenTPN (CLINIMIX-E) Adult     And  . fat emulsion     PRN Meds:.acetaminophen **OR** acetaminophen, HYDROmorphone (DILAUDID) injection, hyoscyamine, levalbuterol, ondansetron **OR** ondansetron (ZOFRAN) IV,  sodium chloride    PHYSICAL EXAM: Vital signs in last 24 hours: Filed Vitals:   01/19/15 2115 01/20/15 0539 01/20/15 0539 01/20/15 0654  BP: 119/65  114/62   Pulse: 89  79   Temp: 98.3 F (36.8 C)   97.7 F (36.5 C)  TempSrc: Oral   Oral  Resp: 18  20   Height:      Weight:  88.8 kg (195 lb 12.3 oz)    SpO2: 95%  97%     Weight change: 1.1 kg (2 lb 6.8 oz) Filed  Weights   01/18/15 0500 01/19/15 0500 01/20/15 0539  Weight: 85.3 kg (188 lb 0.8 oz) 87.7 kg (193 lb 5.5 oz) 88.8 kg (195 lb 12.3 oz)   Body mass index is 34.69 kg/(m^2).   Gen Exam: Awake and alert with clear speech.  Not in any distress Neck: Supple, No JVD.   Chest: B/L Clear.  No rales or rhonchi CVS: S1 S2 Regular, no murmurs.  Abdomen: soft, BS +, minimally tender in lower abd, non distended. Has drain in place. Extremities: no edema, lower extremities warm to touch. Neurologic: Non Focal.   Skin: No Rash.   Wounds: N/A.    Intake/Output from previous day:  Intake/Output Summary (Last 24 hours) at 01/20/15 1104 Last data filed at 01/20/15 1018  Gross per 24 hour  Intake   1230 ml  Output   3710 ml  Net  -2480 ml     LAB RESULTS: CBC  Recent Labs Lab 01/15/15 1920 01/16/15 0531 01/17/15 0604  WBC 11.9* 13.3* 7.2  HGB 9.9* 11.0* 9.5*  HCT 31.2* 34.7* 30.2*  PLT 323 406* 334  MCV 90.4 90.6 91.2  MCH 28.7 28.7 28.7  MCHC 31.7 31.7 31.5  RDW 14.4 14.4 14.4  LYMPHSABS  --   --  2.0  MONOABS  --   --  0.9  EOSABS  --   --  0.1  BASOSABS  --   --  0.0    Chemistries   Recent Labs Lab 01/15/15 1920 01/16/15 0531 01/17/15 0604 01/17/15 0728 01/18/15 0513 01/19/15 0415 01/20/15 0500  NA 135 134* 134*  --  135 137 134*  K 3.7 4.2 3.9  --  3.7 3.8 3.8  CL 100 101 102  --  101 102 104  CO2 29 27 29   --  31 32 29  GLUCOSE 112* 107* 90  --  134* 138* 167*  BUN 9 9 7   --  7 8 11   CREATININE 0.65 0.78 0.73  --  0.62 0.56 0.47*  CALCIUM 8.6 8.6 8.1*  --  8.2* 8.2* 8.1*  MG 1.9  --   --  1.9 1.8 1.9 1.8    CBG:  Recent Labs Lab 01/18/15 1829 01/19/15 0013 01/19/15 0558 01/19/15 1224 01/19/15 1739  GLUCAP 205* 164* 149* 178* 179*    GFR Estimated Creatinine Clearance: 62.3 mL/min (by C-G formula based on Cr of 0.47).  Coagulation profile  Recent Labs Lab 01/16/15 0905  INR 1.13    Cardiac Enzymes No results for input(s): CKMB, TROPONINI,  MYOGLOBIN in the last 168 hours.  Invalid input(s): CK  Invalid input(s): POCBNP No results for input(s): DDIMER in the last 72 hours. No results for input(s): HGBA1C in the last 72 hours.  Recent Labs  01/18/15 0513  TRIG 86   No results for input(s): TSH, T4TOTAL, T3FREE, THYROIDAB in the last 72 hours.  Invalid input(s): FREET3 No results for input(s): VITAMINB12, FOLATE,  FERRITIN, TIBC, IRON, RETICCTPCT in the last 72 hours. No results for input(s): LIPASE, AMYLASE in the last 72 hours.  Urine Studies No results for input(s): UHGB, CRYS in the last 72 hours.  Invalid input(s): UACOL, UAPR, USPG, UPH, UTP, UGL, UKET, UBIL, UNIT, UROB, ULEU, UEPI, UWBC, URBC, UBAC, CAST, UCOM, BILUA  MICROBIOLOGY: Recent Results (from the past 240 hour(s))  Culture, blood (routine x 2)     Status: None (Preliminary result)   Collection Time: 01/15/15  7:10 PM  Result Value Ref Range Status   Specimen Description BLOOD RIGHT ARM  Final   Special Requests BOTTLES DRAWN AEROBIC AND ANAEROBIC 5ML  Final   Culture   Final           BLOOD CULTURE RECEIVED NO GROWTH TO DATE CULTURE WILL BE HELD FOR 5 DAYS BEFORE ISSUING A FINAL NEGATIVE REPORT Performed at [facility]    Report Status PENDING  Incomplete  Culture, blood (routine x 2)     Status: None (Preliminary result)   Collection Time: 01/15/15  7:20 PM  Result Value Ref Range Status   Specimen Description BLOOD RIGHT HAND  Final   Special Requests BOTTLES DRAWN AEROBIC AND ANAEROBIC 5ML  Final   Culture   Final           BLOOD CULTURE RECEIVED NO GROWTH TO DATE CULTURE WILL BE HELD FOR 5 DAYS BEFORE ISSUING A FINAL NEGATIVE REPORT Performed at [facility]    Report Status PENDING  Incomplete  Anaerobic culture     Status: None (Preliminary result)   Collection Time: 01/16/15 12:09 PM  Result Value Ref Range Status   Specimen Description ABSCESS ABDOMEN  Final   Special Requests NONE  Final   Gram Stain   Final     ABUNDANT WBC PRESENT, PREDOMINANTLY PMN NO SQUAMOUS EPITHELIAL CELLS SEEN FEW GRAM NEGATIVE RODS FEW GRAM POSITIVE COCCI IN PAIRS    Culture   Final    NO ANAEROBES ISOLATED; CULTURE IN PROGRESS FOR 5 DAYS Performed at [facility]    Report Status PENDING  Incomplete  Culture, routine-abscess     Status: None   Collection Time: 01/16/15 12:09 PM  Result Value Ref Range Status   Specimen Description ABSCESS ABDOMEN  Final   Special Requests NONE  Final   Gram Stain   Final    ABUNDANT WBC PRESENT, PREDOMINANTLY PMN NO SQUAMOUS EPITHELIAL CELLS SEEN FEW GRAM NEGATIVE RODS FEW GRAM POSITIVE COCCI IN PAIRS    Culture   Final    MULTIPLE ORGANISMS PRESENT, NONE PREDOMINANT Note: NO STAPHYLOCOCCUS AUREUS ISOLATED NO GROUP A STREP (S.PYOGENES) ISOLATED Performed at [facility]    Report Status 01/20/2015 FINAL  Final  Culture, blood (routine x 2)     Status: None (Preliminary result)   Collection Time: 01/16/15  9:08 PM  Result Value Ref Range Status   Specimen Description BLOOD RIGHT ARM  Final   Special Requests BOTTLES DRAWN AEROBIC AND ANAEROBIC 4CC  Final   Culture   Final           BLOOD CULTURE RECEIVED NO GROWTH TO DATE CULTURE WILL BE HELD FOR 5 DAYS BEFORE ISSUING A FINAL NEGATIVE REPORT Performed at [facility]    Report Status PENDING  Incomplete  Culture, blood (routine x 2)     Status: None (Preliminary result)   Collection Time: 01/16/15  9:15 PM  Result Value Ref Range Status   Specimen Description BLOOD  RIGHT HAND  Final   Special Requests BOTTLES DRAWN AEROBIC ONLY 5CC  Final   Culture   Final           BLOOD CULTURE RECEIVED NO GROWTH TO DATE CULTURE WILL BE HELD FOR 5 DAYS BEFORE ISSUING A FINAL NEGATIVE REPORT Performed at [facility]    Report Status PENDING  Incomplete  Culture, Urine     Status: None   Collection Time: 01/16/15  9:32 PM  Result Value Ref Range Status   Specimen Description URINE, RANDOM  Final    Special Requests NONE  Final   Colony Count NO GROWTH Performed at [facility]   Final   Culture NO GROWTH Performed at [facility]   Final   Report Status 01/18/2015 FINAL  Final    RADIOLOGY STUDIES/RESULTS: Ct Image Guided Drainage By Percutaneous Catheter  01/16/2015   CLINICAL DATA:  PELVIC DIVERTICULAR ABSCESS  EXAM: CT GUIDED DRAINAGE OF THE PELVIC DIVERTICULAR ABSCESS  ANESTHESIA/SEDATION: 1.0 Mg IV Versed 50 mcg IV Fentanyl  Total Moderate Sedation Time:  15 MINUTES  PROCEDURE: The procedure, risks, benefits, and alternatives were explained to the patient. Questions regarding the procedure were encouraged and answered. The patient understands and consents to the procedure.  The ANTERIOR PELVIC AREA was prepped with Betadine/chlorhexidinein a sterile fashion, and a sterile drape was applied covering the operative field. A sterile gown and sterile gloves were used for the procedure. Local anesthesia was provided with 1% Lidocaine.  Previous imaging reviewed from [facility]. Patient positioned supine. Noncontrast localization CT performed. the anterior pelvic diverticular abscess over the bladder was localized. Under sterile conditions and local anesthesia, an 18 gauge introducer needle was advanced from a left oblique approach into the abscess. Needle position confirmed with CT. Guidewire inserted followed by tract dilatation to advance a 12 [place] drain. Drain catheter confirmed within the abscess by CT. Drain secured with a Prolene suture and connected to external suction bulb. Patient tolerated the drain procedure well.  COMPLICATIONS: None immediate  FINDINGS: Imaging confirms percutaneous access for drainage of the diverticular abscess.  IMPRESSION: Successful CT-guided anterior pelvic diverticular abscess drain insertion.   Electronically Signed   By: Jerilynn Mages.  Shick M.D.   On: 01/16/2015 12:46    Oren Binet, MD  [facility] Pager:[phone]  If  7PM-7AM, please contact night-coverage [email or web address] Password [email or web address] 01/20/2015, 11:04 AM   LOS: 5 days

## 2015-01-20 NOTE — Progress Notes (Signed)
Referring Physician(s):   CCS  Subjective:  Pelvic abscess drain placed 2/20 Draining well Feels some better Plan for more surgery per pt  Allergies: Aspirin; Ciprofloxacin; Codeine; and Sulfur  Medications: Prior to Admission medications   Medication Sig Start Date End Date Taking? Authorizing Provider  ALPRAZolam (XANAX) 0.25 MG tablet Take 0.25 mg by mouth 2 (two) times daily as needed for anxiety.    Yes Historical Provider, MD  anastrozole (ARIMIDEX) 1 MG tablet Take 1 tablet (1 mg total) by mouth daily. 02/09/14  Yes Deatra Robinson, MD  citalopram (CELEXA) 20 MG tablet Take 10 mg by mouth every evening.  01/03/15  Yes Historical Provider, MD  losartan (COZAAR) 100 MG tablet Take 100 mg by mouth daily.   Yes Historical Provider, MD  ranitidine (ZANTAC) 150 MG capsule Take 150 mg by mouth daily as needed.    Yes Historical Provider, MD  traMADol (ULTRAM) 50 MG tablet Take 50 mg by mouth 2 (two) times daily.  01/13/15  Yes Historical Provider, MD     Vital Signs: BP 114/62 mmHg  Pulse 79  Temp(Src) 97.7 F (36.5 C) (Oral)  Resp 20  Ht 5\' 3"  (1.6 m)  Wt 88.8 kg (195 lb 12.3 oz)  BMI 34.69 kg/m2  SpO2 97%  Physical Exam  Abdominal:  Soft abd Tender at site of drain Clean and dry Output purulent 30 cc yesterday; 10 cc in JP Cx: mult orgs afeb  Nursing note and vitals reviewed.   Imaging: Ct Image Guided Drainage By Percutaneous Catheter  01/16/2015   CLINICAL DATA:  PELVIC DIVERTICULAR ABSCESS  EXAM: CT GUIDED DRAINAGE OF THE PELVIC DIVERTICULAR ABSCESS  ANESTHESIA/SEDATION: 1.0 Mg IV Versed 50 mcg IV Fentanyl  Total Moderate Sedation Time:  15 MINUTES  PROCEDURE: The procedure, risks, benefits, and alternatives were explained to the patient. Questions regarding the procedure were encouraged and answered. The patient understands and consents to the procedure.  The ANTERIOR PELVIC AREA was prepped with Betadine/chlorhexidinein a sterile fashion, and a sterile drape  was applied covering the operative field. A sterile gown and sterile gloves were used for the procedure. Local anesthesia was provided with 1% Lidocaine.  Previous imaging reviewed from [facility]. Patient positioned supine. Noncontrast localization CT performed. the anterior pelvic diverticular abscess over the bladder was localized. Under sterile conditions and local anesthesia, an 18 gauge introducer needle was advanced from a left oblique approach into the abscess. Needle position confirmed with CT. Guidewire inserted followed by tract dilatation to advance a 12 [place] drain. Drain catheter confirmed within the abscess by CT. Drain secured with a Prolene suture and connected to external suction bulb. Patient tolerated the drain procedure well.  COMPLICATIONS: None immediate  FINDINGS: Imaging confirms percutaneous access for drainage of the diverticular abscess.  IMPRESSION: Successful CT-guided anterior pelvic diverticular abscess drain insertion.   Electronically Signed   By: Jerilynn Mages.  Shick M.D.   On: 01/16/2015 12:46    Labs:  CBC:  Recent Labs  08/25/14 1341 01/15/15 1920 01/16/15 0531 01/17/15 0604  WBC 11.0* 11.9* 13.3* 7.2  HGB 12.5 9.9* 11.0* 9.5*  HCT 38.4 31.2* 34.7* 30.2*  PLT 215 323 406* 334    COAGS:  Recent Labs  01/16/15 0905  INR 1.13    BMP:  Recent Labs  01/17/15 0604 01/18/15 0513 01/19/15 0415 01/20/15 0500  NA 134* 135 137 134*  K 3.9 3.7 3.8 3.8  CL 102 101 102 104  CO2 29 31 32 29  GLUCOSE 90 134* 138* 167*  BUN 7 7 8 11   CALCIUM 8.1* 8.2* 8.2* 8.1*  CREATININE 0.73 0.62 0.56 0.47*  GFRNONAA 80* 85* 88* >90  GFRAA >90 >90 >90 >90    LIVER FUNCTION TESTS:  Recent Labs  01/15/15 1920 01/16/15 0531 01/17/15 0604 01/18/15 0513  BILITOT 0.5 0.7 0.6 0.3  AST 11 18 13 13   ALT 7 7 6 7   ALKPHOS 84 103 80 71  PROT 6.6 7.1 5.7* 5.7*  ALBUMIN 2.1* 2.2* 1.7* 1.7*    Assessment and Plan:  Pelvic abscess drain intact Will follow Can be  seen in IR drain clinic in 2-3 weeks if needed Call [phone] for message to PA  Signed: Sunny Aguon A 01/20/2015, 8:46 AM   I spent a total of 15 Minutes in face to face in clinical consultation/evaluation, greater than 50% of which was counseling/coordinating care for pelvic abscess drain

## 2015-01-20 NOTE — Progress Notes (Signed)
PARENTERAL NUTRITION CONSULT NOTE - FOLLOW UP  Pharmacy Consult for TPN Indication: Diverticulitis with abscess, colovesical fistula  Allergies  Allergen Reactions  . Aspirin Other (See Comments)    Makes me "shaky"  . Ciprofloxacin Hives  . Codeine Other (See Comments)    Headache  . Sulfur Rash    [place]     Patient Measurements: Height: _0  (160 cm) Weight: 195 lb 12.3 oz (88.8 kg) IBW/kg (Calculated) : 52.4 Adjusted Body Weight: 66 kg  Vital Signs: Temp: 97.7 F (36.5 C) (02/24 0654) Temp Source: Oral (02/24 0654) BP: 114/62 mmHg (02/24 0539) Pulse Rate: 79 (02/24 0539) Intake/Output from previous day: 02/23 0701 - 02/24 0700 In: 1460 [P.O.:600; TPN:840] Out: 3330 [Urine:3300; Drains:30] Intake/Output from this shift:    Labs: No results for input(s): WBC, HGB, HCT, PLT, APTT, INR in the last 72 hours.   Recent Labs  01/18/15 0513 01/19/15 0415 01/20/15 0500  NA 135 137 134*  K 3.7 3.8 3.8  CL 101 102 104  CO2 31 32 29  GLUCOSE 134* 138* 167*  BUN _1 CREATININE 0.62 0.56 0.47*  CALCIUM 8.2* 8.2* 8.1*  MG 1.8 1.9 1.8  PHOS 3.3 3.7 3.4  PROT 5.7*  --   --   ALBUMIN 1.7*  --   --   AST 13  --   --   ALT 7  --   --   ALKPHOS 71  --   --   BILITOT 0.3  --   --   PREALBUMIN 3.4*  --   --   TRIG 86  --   --    Estimated Creatinine Clearance: 62.3 mL/min (by C-G formula based on Cr of 0.47).    Recent Labs  01/19/15 0558 01/19/15 1224 01/19/15 1739  GLUCAP 149* 178* 179*    Insulin Requirements in the past 24 hours:  5 units SSI  Current Nutrition:  -Clinimix E 5/20 at 70 ml/hr + 20% lipids at 10 ml/hr- provides 1958kCal and 84g of protein -Resource Breeze 1 container TID- each supplement provides 9g protein and 250kCal -Clear liquid- 100% of breakfast charted yesterday, fair appetite  Nutritional Goals:  1800-2000 kCal, 75-85 grams of protein per day as per RD recommendations 2/22  Assessment: [name] with a colovesical fistula.  She started having issues in January and was treated with several courses of antibiotics for brown urine with associated frequency and urgency. She underwent colonoscopy where fistula was found. CT revealed sigmoid diverticulitis abscess.   GI: Sigmoid diverticulitis with abscess and colovesical fistula. Percutaneous drain placed for abscess. She is tolerating clears and continues to have all 3 Resource Breeze supplements. Surgery may try to pursue a diverting loop colostomy to help with resolution of infections and hopefully allow her to eat better. Plans for full resection/repair in the future when her nutritional status improves (baseline prealbumin <3, repeat still low at 3.4)  Endo: hx DM (diet-controlled). CBGs 138-178 in last 24h  Lytes: K 3.8, Mag 1.8, Phos 3.4, CorCa 10- she is not exhibiting signs of refeeding  Renal: SCr stable at 0.47. On mirabegron + Pyridium. UOP 1.25m/kg/hr  Pulm: 97/RA  Cards: VSS- no meds  Hepatobil: alk phos, AST, ALT, and Tbili all WNL. Albumin low at 1.7. Baseline trigs 842 Neuro: A&O   ID: Diverticulitis with abscess, s/p drain placement 2/21. Flagyl and Zosyn changed to Invanz- D#4. Blood cultures both NGTD, urine and abscess cultures negative  Best Practices: Lovenox, MC TPN Access:  PICC placed 2/21 TPN day#: 3 (2/21>)  Plan:  -continue Clinimix E 5/20 at 14m/hr + 20% IVFE at 128mhr-- this is goal rate which provides 84g protein and 1958kCal daily which meets 100% of goals as per RD recommendations -multivitamin and full trace elements in TPN -continue CBGs and sensitive SSI as ordered-will continue to monitor closely and will add insulin to TPN if needed -full TPN labs in the morning as per protocol   Lallie Strahm D. Saburo Luger, PharmD, BCPS Clinical Pharmacist Pager: [phone]/24/2016 8:28 AM

## 2015-01-20 NOTE — Progress Notes (Signed)
Physical Therapy Treatment Patient Details Name: Michele Palmer MRN: [id number] DOB: 1936/11/07 Today's Date: 01/20/2015    History of Present Illness Abdominal pain. CT scan revealed a sigmoid diverticulitis measuring 9.4 cm, and formation of a fistula with the bladder    PT Comments    Pt ambulating 100' again today, but unable to go further.  Cues for posture.  Needs encouragement for OOB, but moving well.  Follow Up Recommendations  Home health PT     Equipment Recommendations  Rolling walker with 5" wheels    Recommendations for Other Services       Precautions / Restrictions Precautions Precautions: Fall Precaution Comments: JP drain Restrictions Weight Bearing Restrictions: No    Mobility  Bed Mobility Overal bed mobility: Needs Assistance Bed Mobility: Rolling;Sidelying to Sit Rolling: Supervision Sidelying to sit: Supervision;HOB elevated (cues for technique)       General bed mobility comments: Pt moving quick in bed and needed cues for proper technique.  Transfers Overall transfer level: Needs assistance Equipment used: Rolling walker (2 wheeled) Transfers: Sit to/from [facility] Sit to Stand: Min guard Stand pivot transfers: Min assist       General transfer comment: Pt transferred and sat up on [facility] for several minutes before gait  Ambulation/Gait Ambulation/Gait assistance: Min guard Ambulation Distance (Feet): 100 Feet Assistive device: Rolling walker (2 wheeled) Gait Pattern/deviations: Trunk flexed;Step-through pattern Gait velocity: decreased       Stairs            Wheelchair Mobility    Modified Rankin (Stroke Patients Only)       Balance     Sitting balance-Leahy Scale: Fair       Standing balance-Leahy Scale: Fair                      Cognition Arousal/Alertness: Awake/alert Behavior During Therapy: WFL for tasks assessed/performed Overall Cognitive Status: Within Functional Limits for  tasks assessed                      Exercises      General Comments        Pertinent Vitals/Pain Pain Assessment: Faces Faces Pain Scale: Hurts little more Pain Location: abd with bed mobility Pain Intervention(s): Limited activity within patient's tolerance    Home Living                      Prior Function            PT Goals (current goals can now be found in the care plan section) Acute Rehab PT Goals Patient Stated Goal: to get stronger PT Goal Formulation: With patient/family Time For Goal Achievement: 02/01/15 Potential to Achieve Goals: Good Progress towards PT goals: Progressing toward goals    Frequency  Min 3X/week    PT Plan Current plan remains appropriate    Co-evaluation             End of Session Equipment Utilized During Treatment: Gait belt Activity Tolerance: Patient tolerated treatment well Patient left: in bed;Other (comment);with call bell/phone within reach;with family/visitor present (with General Surgery PA in the room.)     Time: 0240-9735 PT Time Calculation (min) (ACUTE ONLY): 22 min  Charges:  $Gait Training: 8-22 mins                    G Codes:      Lakecia Deschamps LUBECK 01/20/2015, 10:39 AM

## 2015-01-21 ENCOUNTER — Encounter (HOSPITAL_COMMUNITY)
Admission: AD | Disposition: A | Discharge status: Home Health | Payer: Self-pay | Source: Other Acute Inpatient Hospital

## 2015-01-21 ENCOUNTER — Encounter (HOSPITAL_COMMUNITY): Payer: Self-pay | Admitting: Anesthesiology

## 2015-01-21 ENCOUNTER — Inpatient Hospital Stay (HOSPITAL_COMMUNITY): Payer: Medicare Other | Admitting: Anesthesiology

## 2015-01-21 HISTORY — PX: COLOSTOMY: SHX63

## 2015-01-21 LAB — ANAEROBIC CULTURE

## 2015-01-21 LAB — COMPREHENSIVE METABOLIC PANEL
ALBUMIN: 1.8 g/dL — AB (ref 3.5–5.2)
ALT: 10 U/L (ref 0–35)
AST: 24 U/L (ref 0–37)
Alkaline Phosphatase: 82 U/L (ref 39–117)
Anion gap: 7 (ref 5–15)
BUN: 14 mg/dL (ref 6–23)
CALCIUM: 8.4 mg/dL (ref 8.4–10.5)
CHLORIDE: 99 mmol/L (ref 96–112)
CO2: 29 mmol/L (ref 19–32)
CREATININE: 0.6 mg/dL (ref 0.50–1.10)
GFR calc Af Amer: >90 mL/min (ref >90)
GFR calc non Af Amer: 86 mL/min — ABNORMAL LOW (ref >90)
Glucose, Bld: 170 mg/dL — ABNORMAL HIGH (ref 70–99)
Potassium: 4.1 mmol/L (ref 3.5–5.1)
Sodium: 135 mmol/L (ref 135–145)
Total Bilirubin: 0.2 mg/dL — ABNORMAL LOW (ref 0.3–1.2)
Total Protein: 6.1 g/dL (ref 6.0–8.3)

## 2015-01-21 LAB — GLUCOSE, CAPILLARY
GLUCOSE-CAPILLARY: 189 mg/dL — AB (ref 70–99)
GLUCOSE-CAPILLARY: 201 mg/dL — AB (ref 70–99)
GLUCOSE-CAPILLARY: 221 mg/dL — AB (ref 70–99)
Glucose-Capillary: 177 mg/dL — ABNORMAL HIGH (ref 70–99)
Glucose-Capillary: 169 mg/dL — ABNORMAL HIGH (ref 70–99)
Glucose-Capillary: 197 mg/dL — ABNORMAL HIGH (ref 70–99)
Glucose-Capillary: 218 mg/dL — ABNORMAL HIGH (ref 70–99)
Glucose-Capillary: 210 mg/dL — ABNORMAL HIGH (ref 70–99)
Glucose-Capillary: 207 mg/dL — ABNORMAL HIGH (ref 70–99)

## 2015-01-21 LAB — CBC
HEMATOCRIT: 31.5 % — AB (ref 36.0–46.0)
HEMOGLOBIN: 10 g/dL — AB (ref 12.0–15.0)
MCH: 29.7 pg (ref 26.0–34.0)
MCHC: 31.7 g/dL (ref 30.0–36.0)
MCV: 93.5 fL (ref 78.0–100.0)
Platelets: 214 10*3/uL (ref 150–400)
RBC: 3.37 MIL/uL — ABNORMAL LOW (ref 3.87–5.11)
RDW: 14.7 % (ref 11.5–15.5)
WBC: 10.1 10*3/uL (ref 4.0–10.5)

## 2015-01-21 LAB — PHOSPHORUS: Phosphorus: 3.6 mg/dL (ref 2.3–4.6)

## 2015-01-21 LAB — MAGNESIUM: Magnesium: 1.9 mg/dL (ref 1.5–2.5)

## 2015-01-21 SURGERY — CREATION, COLOSTOMY
Anesthesia: General | Site: Abdomen

## 2015-01-21 MED ORDER — ROCURONIUM BROMIDE 100 MG/10ML IV SOLN
INTRAVENOUS | Status: DC | PRN
  Administered 2015-01-21: 40 mg via INTRAVENOUS

## 2015-01-21 MED ORDER — HYDROMORPHONE HCL 1 MG/ML IJ SOLN
INTRAMUSCULAR | Status: AC
  Administered 2015-01-21: 1 mg
  Filled 2015-01-21: qty 1

## 2015-01-21 MED ORDER — PROPOFOL 10 MG/ML IV BOLUS
INTRAVENOUS | Status: DC | PRN
  Administered 2015-01-21: 140 mg via INTRAVENOUS

## 2015-01-21 MED ORDER — POVIDONE-IODINE 10 % EX OINT
TOPICAL_OINTMENT | CUTANEOUS | Status: AC
  Filled 2015-01-21: qty 28.35

## 2015-01-21 MED ORDER — ONDANSETRON HCL 4 MG/2ML IJ SOLN
INTRAMUSCULAR | Status: DC | PRN
Start: 2015-01-21 — End: 2015-01-21
  Administered 2015-01-21: 4 mg via INTRAVENOUS

## 2015-01-21 MED ORDER — MEPERIDINE HCL 25 MG/ML IJ SOLN: 6.2500 mg | INTRAMUSCULAR | Status: DC | PRN

## 2015-01-21 MED ORDER — ARTIFICIAL TEARS OP OINT
TOPICAL_OINTMENT | OPHTHALMIC | Status: DC | PRN
  Administered 2015-01-21: 1 via OPHTHALMIC

## 2015-01-21 MED ORDER — INSULIN ASPART 100 UNIT/ML ~~LOC~~ SOLN
0.0000 [IU] | SUBCUTANEOUS | Status: DC
  Administered 2015-01-21 – 2015-01-22 (×5): 5 [IU] via SUBCUTANEOUS
  Administered 2015-01-22: 3 [IU] via SUBCUTANEOUS
  Administered 2015-01-22 (×2): 2 [IU] via SUBCUTANEOUS
  Administered 2015-01-23: 3 [IU] via SUBCUTANEOUS
  Administered 2015-01-23: 5 [IU] via SUBCUTANEOUS
  Administered 2015-01-23 (×2): 2 [IU] via SUBCUTANEOUS
  Administered 2015-01-23 (×2): 3 [IU] via SUBCUTANEOUS
  Administered 2015-01-24: 2 [IU] via SUBCUTANEOUS
  Administered 2015-01-24 (×2): 3 [IU] via SUBCUTANEOUS
  Administered 2015-01-24 (×2): 2 [IU] via SUBCUTANEOUS
  Administered 2015-01-24: 3 [IU] via SUBCUTANEOUS
  Administered 2015-01-25 – 2015-01-28 (×9): 2 [IU] via SUBCUTANEOUS
  Administered 2015-01-29: 3 [IU] via SUBCUTANEOUS
  Administered 2015-01-30: 2 [IU] via SUBCUTANEOUS
  Administered 2015-01-30: 3 [IU] via SUBCUTANEOUS
  Administered 2015-01-31 – 2015-02-01 (×4): 2 [IU] via SUBCUTANEOUS
  Administered 2015-02-02: 3 [IU] via SUBCUTANEOUS
  Filled 2015-01-21 (×69): qty 0.15

## 2015-01-21 MED ORDER — ROCURONIUM BROMIDE 50 MG/5ML IV SOLN
INTRAVENOUS | Status: AC
  Filled 2015-01-21: qty 1

## 2015-01-21 MED ORDER — MIDAZOLAM HCL 2 MG/2ML IJ SOLN
INTRAMUSCULAR | Status: AC
  Filled 2015-01-21: qty 2

## 2015-01-21 MED ORDER — NEOSTIGMINE METHYLSULFATE 10 MG/10ML IV SOLN
INTRAVENOUS | Status: DC | PRN
  Administered 2015-01-21: 5 mg via INTRAVENOUS

## 2015-01-21 MED ORDER — PHENYLEPHRINE HCL 10 MG/ML IJ SOLN
INTRAMUSCULAR | Status: DC | PRN
  Administered 2015-01-21 (×2): 40 ug via INTRAVENOUS
  Administered 2015-01-21 (×2): 80 ug via INTRAVENOUS

## 2015-01-21 MED ORDER — PROMETHAZINE HCL 25 MG/ML IJ SOLN: 6.2500 mg | INTRAMUSCULAR | Status: DC | PRN

## 2015-01-21 MED ORDER — POVIDONE-IODINE 10 % EX OINT
TOPICAL_OINTMENT | CUTANEOUS | Status: DC | PRN
  Administered 2015-01-21: 1 via TOPICAL

## 2015-01-21 MED ORDER — ARTIFICIAL TEARS OP OINT
TOPICAL_OINTMENT | OPHTHALMIC | Status: AC
  Filled 2015-01-21: qty 3.5

## 2015-01-21 MED ORDER — SUCCINYLCHOLINE CHLORIDE 20 MG/ML IJ SOLN
INTRAMUSCULAR | Status: DC | PRN
  Administered 2015-01-21: 100 mg via INTRAVENOUS

## 2015-01-21 MED ORDER — SUCCINYLCHOLINE CHLORIDE 20 MG/ML IJ SOLN
INTRAMUSCULAR | Status: AC
  Filled 2015-01-21: qty 1

## 2015-01-21 MED ORDER — ONDANSETRON HCL 4 MG/2ML IJ SOLN
INTRAMUSCULAR | Status: AC
  Filled 2015-01-21: qty 2

## 2015-01-21 MED ORDER — FAT EMULSION 20 % IV EMUL
240.0000 mL | INTRAVENOUS | Status: AC
  Administered 2015-01-21: 240 mL via INTRAVENOUS
  Filled 2015-01-21: qty 250

## 2015-01-21 MED ORDER — 0.9 % SODIUM CHLORIDE (POUR BTL) OPTIME
TOPICAL | Status: DC | PRN
  Administered 2015-01-21: 1000 mL

## 2015-01-21 MED ORDER — TRACE MINERALS CR-CU-F-FE-I-MN-MO-SE-ZN IV SOLN
INTRAVENOUS | Status: AC
  Administered 2015-01-21: 17:00:00 via INTRAVENOUS
  Filled 2015-01-21: qty 1680

## 2015-01-21 MED ORDER — NEOSTIGMINE METHYLSULFATE 10 MG/10ML IV SOLN
INTRAVENOUS | Status: AC
  Filled 2015-01-21: qty 1

## 2015-01-21 MED ORDER — HYDROMORPHONE HCL 1 MG/ML IJ SOLN
0.2500 mg | INTRAMUSCULAR | Status: DC | PRN
  Administered 2015-01-21 (×2): 0.25 mg via INTRAVENOUS

## 2015-01-21 MED ORDER — FENTANYL CITRATE 0.05 MG/ML IJ SOLN
INTRAMUSCULAR | Status: DC | PRN
  Administered 2015-01-21: 100 ug via INTRAVENOUS
  Administered 2015-01-21: 50 ug via INTRAVENOUS

## 2015-01-21 MED ORDER — LACTATED RINGERS IV SOLN
INTRAVENOUS | Status: DC
  Administered 2015-01-24 – 2015-01-31 (×8): via INTRAVENOUS
  Filled 2015-01-21: qty 1000

## 2015-01-21 MED ORDER — FENTANYL CITRATE 0.05 MG/ML IJ SOLN
INTRAMUSCULAR | Status: AC
  Filled 2015-01-21: qty 5

## 2015-01-21 MED ORDER — LOSARTAN POTASSIUM 50 MG PO TABS
100.0000 mg | ORAL_TABLET | Freq: Every day | ORAL | Status: DC
  Administered 2015-01-22 – 2015-02-05 (×12): 100 mg via ORAL
  Filled 2015-01-21 (×17): qty 2

## 2015-01-21 MED ORDER — PHENYLEPHRINE 40 MCG/ML (10ML) SYRINGE FOR IV PUSH (FOR BLOOD PRESSURE SUPPORT)
PREFILLED_SYRINGE | INTRAVENOUS | Status: AC
  Filled 2015-01-21: qty 10

## 2015-01-21 MED ORDER — GLYCOPYRROLATE 0.2 MG/ML IJ SOLN
INTRAMUSCULAR | Status: DC | PRN
  Administered 2015-01-21: .8 mg via INTRAVENOUS

## 2015-01-21 MED ORDER — GLYCOPYRROLATE 0.2 MG/ML IJ SOLN
INTRAMUSCULAR | Status: AC
  Filled 2015-01-21: qty 4

## 2015-01-21 MED ORDER — SODIUM CHLORIDE 0.9 % IV SOLN
INTRAVENOUS | Status: DC | PRN
  Administered 2015-01-21: 09:00:00 via INTRAVENOUS

## 2015-01-21 SURGICAL SUPPLY — 65 items
BLADE SURG ROTATE 9660 (MISCELLANEOUS) IMPLANT
CANISTER SUCTION 2500CC (MISCELLANEOUS) ×3 IMPLANT
CHLORAPREP W/TINT 26ML (MISCELLANEOUS) ×3 IMPLANT
COVER MAYO STAND STRL (DRAPES) IMPLANT
COVER SURGICAL LIGHT HANDLE (MISCELLANEOUS) ×3 IMPLANT
DRAPE LAPAROSCOPIC ABDOMINAL (DRAPES) ×3 IMPLANT
DRAPE PROXIMA HALF (DRAPES) IMPLANT
DRAPE UTILITY XL STRL (DRAPES) IMPLANT
DRAPE WARM FLUID 44X44 (DRAPE) IMPLANT
DRSG OPSITE POSTOP 4X10 (GAUZE/BANDAGES/DRESSINGS) IMPLANT
DRSG OPSITE POSTOP 4X8 (GAUZE/BANDAGES/DRESSINGS) ×3 IMPLANT
ELECT BLADE 6.5 EXT (BLADE) ×3 IMPLANT
ELECT CAUTERY BLADE 6.4 (BLADE) ×3 IMPLANT
ELECT REM PT RETURN 9FT ADLT (ELECTROSURGICAL) ×3
ELECTRODE REM PT RTRN 9FT ADLT (ELECTROSURGICAL) ×2 IMPLANT
GAUZE SPONGE 4X4 12PLY STRL (GAUZE/BANDAGES/DRESSINGS) ×3 IMPLANT
GLOVE BIO SURGEON STRL SZ 6.5 (GLOVE) ×3 IMPLANT
GLOVE BIO SURGEON STRL SZ7 (GLOVE) ×3 IMPLANT
GLOVE BIO SURGEON STRL SZ8 (GLOVE) ×3 IMPLANT
GLOVE BIOGEL PI IND STRL 7.0 (GLOVE) ×4 IMPLANT
GLOVE BIOGEL PI IND STRL 8 (GLOVE) ×4 IMPLANT
GLOVE BIOGEL PI INDICATOR 7.0 (GLOVE) ×2
GLOVE BIOGEL PI INDICATOR 8 (GLOVE) ×2
GLOVE ECLIPSE 6.5 STRL STRAW (GLOVE) ×9 IMPLANT
GLOVE ECLIPSE 7.5 STRL STRAW (GLOVE) ×3 IMPLANT
GOWN STRL REUS W/ TWL LRG LVL3 (GOWN DISPOSABLE) ×6 IMPLANT
GOWN STRL REUS W/ TWL XL LVL3 (GOWN DISPOSABLE) ×2 IMPLANT
GOWN STRL REUS W/TWL LRG LVL3 (GOWN DISPOSABLE) ×3
GOWN STRL REUS W/TWL XL LVL3 (GOWN DISPOSABLE) ×1
KIT BASIN OR (CUSTOM PROCEDURE TRAY) ×3 IMPLANT
KIT OSTOMY DRAINABLE 2.75 STR (WOUND CARE) ×3 IMPLANT
KIT ROOM TURNOVER OR (KITS) ×3 IMPLANT
LEGGING LITHOTOMY PAIR STRL (DRAPES) IMPLANT
LIGASURE IMPACT 36 18CM CVD LR (INSTRUMENTS) IMPLANT
NS IRRIG 1000ML POUR BTL (IV SOLUTION) ×3 IMPLANT
OSTOMY BRIDGE 2 1/2 (MISCELLANEOUS) ×3 IMPLANT
PACK GENERAL/GYN (CUSTOM PROCEDURE TRAY) ×3 IMPLANT
PAD ARMBOARD 7.5X6 YLW CONV (MISCELLANEOUS) ×3 IMPLANT
PENCIL BUTTON HOLSTER BLD 10FT (ELECTRODE) ×3 IMPLANT
SPECIMEN JAR X LARGE (MISCELLANEOUS) IMPLANT
SPONGE LAP 18X18 X RAY DECT (DISPOSABLE) IMPLANT
STAPLER VISISTAT 35W (STAPLE) ×3 IMPLANT
SUCTION POOLE TIP (SUCTIONS) IMPLANT
SURGILUBE 2OZ TUBE FLIPTOP (MISCELLANEOUS) IMPLANT
SUT ETHILON 2 0 FS 18 (SUTURE) ×3 IMPLANT
SUT NOVA 1 T20/GS 25DT (SUTURE) ×3 IMPLANT
SUT PDS AB 1 TP1 96 (SUTURE) ×6 IMPLANT
SUT PROLENE 2 0 CT2 30 (SUTURE) IMPLANT
SUT PROLENE 2 0 KS (SUTURE) IMPLANT
SUT SILK 2 0 SH CR/8 (SUTURE) ×3 IMPLANT
SUT SILK 2 0 TIES 10X30 (SUTURE) IMPLANT
SUT SILK 3 0 SH CR/8 (SUTURE) IMPLANT
SUT SILK 3 0 TIES 10X30 (SUTURE) IMPLANT
SUT VIC AB 3-0 SH 27 (SUTURE)
SUT VIC AB 3-0 SH 27X BRD (SUTURE) IMPLANT
SUT VIC AB 3-0 SH 8-18 (SUTURE) ×3 IMPLANT
SYR BULB IRRIGATION 50ML (SYRINGE) IMPLANT
TAPE CLOTH SURG 4X10 WHT LF (GAUZE/BANDAGES/DRESSINGS) ×3 IMPLANT
TOWEL OR 17X26 10 PK STRL BLUE (TOWEL DISPOSABLE) ×3 IMPLANT
TRAY FOLEY CATH 14FRSI W/METER (CATHETERS) IMPLANT
TRAY PROCTOSCOPIC FIBER OPTIC (SET/KITS/TRAYS/PACK) IMPLANT
TUBE CONNECTING 12X1/4 (SUCTIONS) IMPLANT
UNDERPAD 30X30 INCONTINENT (UNDERPADS AND DIAPERS) IMPLANT
WATER STERILE IRR 1000ML POUR (IV SOLUTION) IMPLANT
YANKAUER SUCT BULB TIP NO VENT (SUCTIONS) IMPLANT

## 2015-01-21 NOTE — Op Note (Signed)
OPERATIVE REPORT  DATE OF OPERATION:  01/21/2015  PATIENT:  Michele Palmer  79 y.o. female  PRE-OPERATIVE DIAGNOSIS:  Diverticulitis, Colovesical Fistula  POST-OPERATIVE DIAGNOSIS:  Diverticulitis, Colovesical Fistula  PROCEDURE:  Procedure(s): DIVERTING LOOP COLOSTOMY  SURGEON:  Surgeon(s): Doreen Salvage, MD Georganna Skeans, MD  ASSISTANT: Grandville Silos  ANESTHESIA:   general  EBL: <50 ml  BLOOD ADMINISTERED: none  DRAINS: Urinary Catheter (Foley)   SPECIMEN:  No Specimen  COUNTS CORRECT:  YES  PROCEDURE DETAILS: The patient was taken to the operating room and placed on the table in the supine position. After an adequate general endotracheal anesthetic was administered she was prepped and draped in usual sterile manner however we had to leave her previously placed midline lower abdominal percutaneous drain in place.  A proper timeout was performed identifying the patient and the procedure to be performed. We made a midline incision from just above the umbilicus to the right of the umbilicus down to and through the midline fascia. Once we entered the peritoneal cavity there were some omental adhesions to the anterior abdominal wall which were taken down with electrocautery.  The descending colon midway between the sigmoid and the splenic flexure was taken down at the line of Toldt. We subsequently made an ostomy opening on the left side at a spot previously marked by the wound care nurses. We brought out a knuckle of the descending colon at that spot and used a white bar underneath the mesentery in order to secured in place. This was secured to the skin with 2-0 nylon suture. The ostomy was subsequently opened and matured using 3-0 Vicryl pop-off.  Prior to opening the stoma the abdomen was closed in the usual manner using running looped #1 PDS suture along with intervening internal retention sutures of #1 Novafil. The skin was closed using stainless steel staples. A sterile dressing was  applied.  All needle counts, sponge counts, and instrument counts were correct.  PATIENT DISPOSITION:  PACU - hemodynamically stable.   Chidera Dearcos, JAY 2/25/201611:04 AM

## 2015-01-21 NOTE — Anesthesia Preprocedure Evaluation (Addendum)
Anesthesia Evaluation  Patient identified by MRN, date of birth, ID band Patient awake    Reviewed: Allergy & Precautions, H&P , NPO status , Patient's Chart, lab work & pertinent test results  Airway Mallampati: II  TM Distance: >3 FB Neck ROM: Full    Dental  (+) Partial Upper, Dental Advisory Given, Missing   Pulmonary neg pulmonary ROS,  breath sounds clear to auscultation        Cardiovascular hypertension, Pt. on medications Rhythm:Regular Rate:Normal     Neuro/Psych negative neurological ROS  negative psych ROS   GI/Hepatic Neg liver ROS, GERD-  Controlled and Medicated,Nexium this morning   Endo/Other  negative endocrine ROS  Renal/GU negative Renal ROS     Musculoskeletal  (+) Arthritis -,   Abdominal   Peds  Hematology  (+) anemia ,   Anesthesia Other Findings   Reproductive/Obstetrics negative OB ROS                            Anesthesia Physical  Anesthesia Plan  ASA: II  Anesthesia Plan: General   Post-op Pain Management:    Induction: Intravenous  Airway Management Planned: Oral ETT  Additional Equipment: None  Intra-op Plan:   Post-operative Plan: Extubation in OR  Informed Consent: I have reviewed the patients History and Physical, chart, labs and discussed the procedure including the risks, benefits and alternatives for the proposed anesthesia with the patient or authorized representative who has indicated his/her understanding and acceptance.   Dental advisory given  Plan Discussed with: CRNA  Anesthesia Plan Comments:         Anesthesia Quick Evaluation

## 2015-01-21 NOTE — Progress Notes (Signed)
PARENTERAL NUTRITION CONSULT NOTE - FOLLOW UP  Pharmacy Consult for TPN Indication: Diverticulitis with abscess, colovesical fistula  Allergies  Allergen Reactions  . Aspirin Other (See Comments)    Makes me "shaky"  . Ciprofloxacin Hives  . Codeine Other (See Comments)    Headache  . Sulfur Rash    Welps     Patient Measurements: Height: _0  (160 cm) Weight: 188 lb (85.276 kg) IBW/kg (Calculated) : 52.4 Adjusted Body Weight: 66 kg  Vital Signs: Temp: 98.4 F (36.9 C) (02/25 0533) Temp Source: Oral (02/25 0533) BP: 117/68 mmHg (02/25 0533) Pulse Rate: 85 (02/25 0533) Intake/Output from previous day: 02/24 0701 - 02/25 0700 In: 3486.2 [P.O.:300; IV Piggyback:150; TPN:3021.2] Out: 705 [Urine:700; Drains:5] Intake/Output from this shift: Total I/O In: 0  Out: 100 [Urine:100]  Labs:  Recent Labs  01/21/15 0600  WBC 10.1  HGB 10.0*  HCT 31.5*  PLT 214     Recent Labs  01/19/15 0415 01/20/15 0500 01/21/15 0600  NA 137 134* 135  K 3.8 3.8 4.1  CL 102 104 99  CO2 32 29 29  GLUCOSE 138* 167* 170*  BUN _1 CREATININE 0.56 0.47* 0.60  CALCIUM 8.2* 8.1* 8.4  MG 1.9 1.8 1.9  PHOS 3.7 3.4 3.6  PROT  --   --  6.1  ALBUMIN  --   --  1.8*  AST  --   --  24  ALT  --   --  10  ALKPHOS  --   --  82  BILITOT  --   --  0.2*   Estimated Creatinine Clearance: 61 mL/min (by C-G formula based on Cr of 0.6).    Recent Labs  01/21/15 0250 01/21/15 0634 01/21/15 0850  GLUCAP 189* 177* 169*    Insulin Requirements in the past 24 hours:  8 units SSI  Current Nutrition:  -Clinimix E 5/20 at 70 ml/hr + 20% lipids at 10 ml/hr- provides 1958kCal and 84g of protein -Resource Breeze 1 container TID- each supplement provides 9g protein and 250kCal Was on Clear liquid (50% of breakfast charted yesterday, fair appetite), NPO after MN for surgery prep  Nutritional Goals:  1800-2000 kCal, 75-85 grams of protein per day as per RD recommendations  2/22  Assessment: [name] with a colovesical fistula. She started having issues in January and was treated with several courses of antibiotics for brown urine with associated frequency and urgency. She underwent colonoscopy where fistula was found. CT revealed sigmoid diverticulitis abscess.   GI: Sigmoid diverticulitis with abscess and colovesical fistula. Percutaneous drain placed for abscess. She is tolerating clears and continues to have all 3 Resource Breeze supplements. Currently in surgery for a diverting loop colostomy to help with resolution of infections and hopefully allow her to eat better. Plans for full resection/repair in the future when her nutritional status improves (baseline prealbumin <3, repeat still low at 3.4)  Endo: hx DM (diet-controlled). CBGs 169-200 in last 24h  Lytes: K 4.1, Mag 1.9, Phos 3.6, CorCa 10.1  Renal: SCr stable at 0.6. On mirabegron + Pyridium. UOP 0.76m/kg/hr  Pulm: 90/RA  Cards: VSS, though BP on softer side- no meds  Hepatobil: alk phos, AST, and ALT all WNL. TBili low at 0.2. Albumin low at 1.7. Baseline trigs 863 Neuro: A&O   ID: Diverticulitis with abscess, s/p drain placement 2/21. Flagyl and Zosyn changed to Invanz- D#5. Blood cultures both NGTD, urine and abscess cultures negative  Best Practices: Lovenox, MC TPN  Access: PICC placed 2/21 TPN day#: 4 (2/21>)  Plan:  -continue Clinimix E 5/20 at 76m/hr + 20% IVFE at 135mhr-- this is goal rate which provides 84g protein and 1958kCal daily which meets 100% of goals as per RD recommendations -multivitamin and full trace elements in TPN -continue CBGs, change sensitive SSI to moderate SSI-will continue to monitor closely and will add insulin to TPN if needed -BMET, mag and phos in the morning   Jazsmine Macari D. Edoardo Laforte, PharmD, BCPS Clinical Pharmacist Pager: [phone]/25/2016 9:56 AM

## 2015-01-21 NOTE — Anesthesia Procedure Notes (Signed)
Procedure Name: Intubation Date/Time: 01/21/2015 9:44 AM Performed by: Terrill Mohr Pre-anesthesia Checklist: Patient identified, Emergency Drugs available, Suction available and Patient being monitored Patient Re-evaluated:Patient Re-evaluated prior to inductionOxygen Delivery Method: Circle system utilized Preoxygenation: Pre-oxygenation with 100% oxygen Intubation Type: IV induction Ventilation: Mask ventilation without difficulty Laryngoscope Size: Mac and 3 Grade View: Grade I Tube type: Oral Tube size: 7.5 mm Number of attempts: 1 Airway Equipment and Method: Stylet Placement Confirmation: ETT inserted through vocal cords under direct vision,  breath sounds checked- equal and bilateral and positive ETCO2 Secured at: 21 (cm at teeth) cm Tube secured with: Tape Dental Injury: Teeth and Oropharynx as per pre-operative assessment

## 2015-01-21 NOTE — Anesthesia Postprocedure Evaluation (Signed)
Anesthesia Post Note  Patient: Michele Palmer  Procedure(s) Performed: Procedure(s) (LRB): DIVERTING LOOP COLOSTOMY (N/A)  Anesthesia type: General  Patient location: PACU  Post pain: Pain level controlled  Post assessment: Post-op Vital signs reviewed  Last Vitals: BP 146/67 mmHg  Pulse 78  Temp(Src) 36.5 C (Oral)  Resp 15  Ht 5\' 3"  (1.6 m)  Wt 188 lb (85.276 kg)  BMI 33.31 kg/m2  SpO2 95%  Post vital signs: Reviewed  Level of consciousness: sedated  Complications: No apparent anesthesia complications

## 2015-01-21 NOTE — Progress Notes (Signed)
General Surgery will assume primary service. [facility] will sign off. Please consult prn. Thanks

## 2015-01-21 NOTE — Progress Notes (Signed)
OT Cancellation Note  Patient Details Name: Michele Palmer MRN: [id number] DOB: 23-Aug-1936   Cancelled Treatment:    Reason Eval/Treat Not Completed: Medical issues which prohibited therapy. Pt for OR today for colostomy. OT will follow up as appropriate to address ADL goals.   Villa Herb M   Cyndie Chime, OTR/L Occupational Therapist [phone] (pager)  01/21/2015, 8:03 AM

## 2015-01-21 NOTE — Transfer of Care (Signed)
Immediate Anesthesia Transfer of Care Note  Patient: Michele Palmer  Procedure(s) Performed: Procedure(s): DIVERTING LOOP COLOSTOMY (N/A)  Patient Location: PACU  Anesthesia Type:General  Level of Consciousness: awake, alert  and oriented  Airway & Oxygen Therapy: Patient Spontanous Breathing and Patient connected to nasal cannula oxygen  Post-op Assessment: Report given to RN, Post -op Vital signs reviewed and stable and Patient moving all extremities X 4  Post vital signs: Reviewed and stable  Last Vitals:  Filed Vitals:   01/21/15 0533  BP: 117/68  Pulse: 85  Temp: 36.9 C  Resp: 20    Complications: No apparent anesthesia complications

## 2015-01-22 ENCOUNTER — Encounter (HOSPITAL_COMMUNITY): Payer: Self-pay | Admitting: General Surgery

## 2015-01-22 LAB — GLUCOSE, CAPILLARY
GLUCOSE-CAPILLARY: 177 mg/dL — AB (ref 70–99)
GLUCOSE-CAPILLARY: 183 mg/dL — AB (ref 70–99)
GLUCOSE-CAPILLARY: 206 mg/dL — AB (ref 70–99)
Glucose-Capillary: 121 mg/dL — ABNORMAL HIGH (ref 70–99)
Glucose-Capillary: 156 mg/dL — ABNORMAL HIGH (ref 70–99)
Glucose-Capillary: 204 mg/dL — ABNORMAL HIGH (ref 70–99)
Glucose-Capillary: 184 mg/dL — ABNORMAL HIGH (ref 70–99)
Glucose-Capillary: 214 mg/dL — ABNORMAL HIGH (ref 70–99)

## 2015-01-22 LAB — CULTURE, BLOOD (ROUTINE X 2)
Culture: NO GROWTH
Culture: NO GROWTH

## 2015-01-22 LAB — BASIC METABOLIC PANEL
ANION GAP: 4 — AB (ref 5–15)
BUN: 15 mg/dL (ref 6–23)
CALCIUM: 7.9 mg/dL — AB (ref 8.4–10.5)
CO2: 27 mmol/L (ref 19–32)
Chloride: 100 mmol/L (ref 96–112)
Creatinine, Ser: 0.51 mg/dL (ref 0.50–1.10)
GFR calc non Af Amer: >90 mL/min (ref >90)
Glucose, Bld: 183 mg/dL — ABNORMAL HIGH (ref 70–99)
POTASSIUM: 4.5 mmol/L (ref 3.5–5.1)
SODIUM: 131 mmol/L — AB (ref 135–145)

## 2015-01-22 LAB — MAGNESIUM: Magnesium: 1.9 mg/dL (ref 1.5–2.5)

## 2015-01-22 LAB — PHOSPHORUS: PHOSPHORUS: 3.6 mg/dL (ref 2.3–4.6)

## 2015-01-22 MED ORDER — FAT EMULSION 20 % IV EMUL
240.0000 mL | INTRAVENOUS | Status: AC
  Administered 2015-01-22: 240 mL via INTRAVENOUS
  Filled 2015-01-22: qty 250

## 2015-01-22 MED ORDER — TRACE MINERALS CR-CU-F-FE-I-MN-MO-SE-ZN IV SOLN
INTRAVENOUS | Status: AC
  Administered 2015-01-22: 17:00:00 via INTRAVENOUS
  Filled 2015-01-22: qty 1680

## 2015-01-22 NOTE — Progress Notes (Signed)
Inpatient Diabetes Program Recommendations  AACE/ADA: New Consensus Statement on Inpatient Glycemic Control (2013)  Target Ranges:  Prepandial:   less than 140 mg/dL      Peak postprandial:   less than 180 mg/dL (1-2 hours)      Critically ill patients:  140 - 180 mg/dL   Results for Michele Palmer, Michele Palmer (MRN [id number]) as of 01/22/2015 10:02  Ref. Range 01/21/2015 18:56 01/21/2015 20:38 01/21/2015 23:59 01/22/2015 04:23 01/22/2015 08:46  Glucose-Capillary Latest Range: 70-99 mg/dL 210 (H) 207 (H) 201 (H) 177 (H) 121 (H)    Reason for assessment: elevated CBG  Diabetes history: Noted in the H&P Outpatient Diabetes medications: none Current orders for Inpatient glycemic control: Novolog 0-15 units q4h  CBG remain elevated despite moderate scale Novolog correction- may want to consider increasing to Novolog resistant correction scale q4h.  Gentry Fitz, RN, BA, MHA, CDE Diabetes Coordinator Inpatient Diabetes Program  [phone] (Team Pager) [phone] [facility]) 01/22/2015 10:06 AM

## 2015-01-22 NOTE — Progress Notes (Signed)
General surgery returned call- patient will be assessed this morning.

## 2015-01-22 NOTE — Progress Notes (Signed)
Physical Therapy Treatment Patient Details Name: Michele Palmer MRN: [id number] DOB: Oct 21, 1936 Today's Date: 01/22/2015    History of Present Illness Abdominal pain. 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  Pt had diverting loop colostomy 01/21/15.    PT Comments    Pt had colostomy yesterday.  Pt in room with daughter and they report frequent leakage overnight of catheter and that nursing is getting a larger catheter.  Pt had to be cleaned up twice during PT session due to leakage with odor.  Due to leakage, did not ambulate outside of the room today.  Pt with congestion and was able to cough up some phlegm.  Pt and daughter were educated on the importance of OOB and upright positioning for cardiopulmonary status.   Follow Up Recommendations  Home health PT     Equipment Recommendations  Rolling walker with 5" wheels    Recommendations for Other Services       Precautions / Restrictions Precautions Precautions: Fall Precaution Comments: JP drain,colostomy Restrictions Weight Bearing Restrictions: No    Mobility  Bed Mobility   Bed Mobility: Rolling;Sidelying to Sit Rolling: Supervision Sidelying to sit: Min guard       General bed mobility comments: Rolling to clean pt after catheter leaked while in bed.  Once sitting up in bed, pt had another "bladder spasm" and more leakage with odor occurred.    Transfers Overall transfer level: Needs assistance Equipment used: Rolling walker (2 wheeled) Transfers: Sit to/from Stand Sit to Stand: Min guard         General transfer comment: Stood at [employer] while cleaned from leakage. cues for hand placement.  At EOB, pt able to spit up some phlegm and educated on bracing with use of pillow to decrease abd pain with coughing  Ambulation/Gait Ambulation/Gait assistance: Min guard Ambulation Distance (Feet): 15 Feet Assistive device: Rolling walker (2 wheeled)   Gait  velocity: slow   General Gait Details: Pt ambulated in room to recliner and at end of gait felt she was going to have another bladder spasm with leakage.    Stairs            Wheelchair Mobility    Modified Rankin (Stroke Patients Only)       Balance   Sitting-balance support: Feet supported;No upper extremity supported Sitting balance-Leahy Scale: Fair     Standing balance support: Bilateral upper extremity supported Standing balance-Leahy Scale: Fair                      Cognition Arousal/Alertness: Awake/alert Behavior During Therapy: WFL for tasks assessed/performed Overall Cognitive Status: Within Functional Limits for tasks assessed                      Exercises      General Comments        Pertinent Vitals/Pain Pain Assessment: Faces Pain Score: 4  Pain Location: "bladder spasms" Pain Intervention(s): Limited activity within patient's tolerance;Patient requesting pain meds-RN notified    Home Living                      Prior Function            PT Goals (current goals can now be found in the care plan section) Acute Rehab PT Goals Patient Stated Goal: to get stronger PT Goal Formulation: With patient/family Time For Goal Achievement: 02/01/15 Potential  to Achieve Goals: Good Progress towards PT goals: Not progressing toward goals - comment (limited by frequent leakage today)    Frequency  Min 3X/week    PT Plan Current plan remains appropriate    Co-evaluation             End of Session   Activity Tolerance: Treatment limited secondary to medical complications (Comment) (frequent leakage from catheter ) Patient left: in chair;with family/visitor present;with call bell/phone within reach     Time: 8299-3716 PT Time Calculation (min) (ACUTE ONLY): 37 min  Charges:  $Gait Training: 8-22 mins $Therapeutic Activity: 8-22 mins                    G Codes:     Michele Palmer, Michele Palmer Pager  [phone] 01/22/2015  Michele Palmer 01/22/2015, 12:01 PM

## 2015-01-22 NOTE — Progress Notes (Addendum)
PARENTERAL NUTRITION CONSULT NOTE - FOLLOW UP  Pharmacy Consult for TPN Indication: Diverticulitis with abscess, colovesical fistula  Allergies  Allergen Reactions  . Aspirin Other (See Comments)    Makes me "shaky"  . Ciprofloxacin Hives  . Codeine Other (See Comments)    Headache  . Sulfur Rash    Welps    Patient Measurements: Height: 5' 3"  (160 cm) Weight: 192 lb 7.4 oz (87.3 kg) IBW/kg (Calculated) : 52.4 Adjusted Body Weight: 66 kg  Vital Signs: Temp: 98.5 F (36.9 C) (02/26 0707) Temp Source: Oral (02/26 0707) BP: 123/62 mmHg (02/26 0707) Pulse Rate: 88 (02/26 0707) Intake/Output from previous day: 02/25 0701 - 02/26 0700 In: 2068 [P.O.:60; I.V.:900; TPN:1103] Out: 183 [Urine:130; Drains:3; Blood:50]  Labs:  Recent Labs  01/21/15 0600  WBC 10.1  HGB 10.0*  HCT 31.5*  PLT 214    Recent Labs  01/20/15 0500 01/21/15 0600 01/22/15 0550  NA 134* 135 131*  K 3.8 4.1 4.5  CL 104 99 100  CO2 29 29 27   GLUCOSE 167* 170* 183*  BUN 11 14 15   CREATININE 0.47* 0.60 0.51  CALCIUM 8.1* 8.4 7.9*  MG 1.8 1.9 1.9  PHOS 3.4 3.6 3.6  PROT  --  6.1  --   ALBUMIN  --  1.8*  --   AST  --  24  --   ALT  --  10  --   ALKPHOS  --  82  --   BILITOT  --  0.2*  --    Estimated Creatinine Clearance: 61.7 mL/min (by C-G formula based on Cr of 0.51).    Recent Labs  01/21/15 2359 01/22/15 0423 01/22/15 0846  GLUCAP 201* 177* 121*   Insulin Requirements in the past 24 hours:  17 units SSI  Current Nutrition:  -Clinimix E 5/20 at 70 ml/hr + 20% lipids at 10 ml/hr- provides 1958kCal and 84g of protein -Resource Breeze 1 container TID- each supplement provides 9g protein and 250kCal Was on Clear liquid (50% of breakfast charted yesterday, fair appetite), NPO after MN for surgery prep  Nutritional Goals:  1800-2000 kCal, 75-85 grams of protein per day as per RD recommendations 2/22  Assessment: [name] with a colovesical fistula. She started having issues in  January and was treated with several courses of antibiotics for brown urine with associated frequency and urgency. She underwent colonoscopy where fistula was found. CT revealed sigmoid diverticulitis abscess.   GI: Sigmoid diverticulitis with abscess and colovesical fistula. Percutaneous drain placed for abscess. She is now s/p surgery for a diverting loop colostomy.  She has a daughter in law who will be a caregiver at discharge.  Endo: hx DM (diet-controlled). CBGs 121-201 in last 24h requiring a bit more insulin but may be due to fluids from surgery.  Will not adjust insulin today.  Lytes: Electrolytes are stable with K 4.5, Mag 1.9, Phos 3.6, CorCa 10.1  Renal: SCr stable at 0.5. UOP 0.64m/kg/hr.  She continues on Mirabegron ER but is now off phenazopyridine.  Pulm: 90/RA  Cards: VSS - no meds   Hepatobil: alk phos, AST, and ALT all WNL. TBili low at 0.2. Albumin low at 1.7. Baseline trigs 832 Neuro: A&O   ID: Diverticulitis with abscess, s/p drain placement 2/21. Flagyl and Zosyn changed to Invanz- D#6. Blood cultures both NGTD, urine and abscess cultures negative.  Will need to determine LOT with IV antibiotics  Best Practices: Lovenox, MC TPN Access: PICC placed 2/21 TPN day#: 5 (  2/21>)  Plan:  -continue Clinimix E 5/20 at 52m/hr + 20% IVFE at 128mhr-- this is goal rate which provides 84g protein and 1958kCal daily which meets 100% of goals as per RD recommendations -multivitamin and full trace elements in TPN -continue CBGs, change sensitive SSI to moderate SSI-will continue to monitor closely and will add insulin to TPN if needed -BMET, mag and phos in the morning   NiRober MinionPharmD., MS Clinical Pharmacist Pager:  [phone] you for allowing pharmacy to be part of this patients care team. 01/22/2015 8:53 AM

## 2015-01-22 NOTE — Progress Notes (Signed)
Noticed that foley bag has not been full all night but patient has urinated a few times on the bed pad- NS taken from foley balloon and 10cc added. Will continue to monitor to see if urine goes into bag. Will pass on to day RN.

## 2015-01-22 NOTE — Progress Notes (Signed)
OT Cancellation Note  Patient Details Name: Michele Palmer MRN: [id number] DOB: Sep 23, 1936   Cancelled Treatment:    Reason Eval/Treat Not Completed: Patient declined, no reason specified. Pt declined as she is waiting for a new catheter to be put in. Pt explained "leakage" issue with current catheter and does not want to walk until she has had this resolved. Pt requested that OT follow up with her at a later date. Acute OT will continue to follow up as available.   Villa Herb M   Cyndie Chime, OTR/L Occupational Therapist [phone] (pager)  01/22/2015, 3:18 PM

## 2015-01-22 NOTE — Progress Notes (Signed)
[facility] Progress Note  1 Day Post-Op  Subjective: Pt feels okay, c/o pain at incision sites, no flatus or BM from ostomy bag yet.  WOC did teaching this am. Daughter in law at bedside.  Not ambulated OOB much.  C/o stool "coming from my vagina" and "urine leaking around catheter".    Objective: Vital signs in last 24 hours: Temp:  [97.7 F (36.5 C)-98.9 F (37.2 C)] 98.5 F (36.9 C) (02/26 0707) Pulse Rate:  [73-94] 88 (02/26 0707) Resp:  [14-24] 16 (02/26 0707) BP: (98-154)/(54-76) 123/62 mmHg (02/26 0707) SpO2:  [90 %-99 %] 96 % (02/26 0707) Weight:  [192 lb 7.4 oz (87.3 kg)] 192 lb 7.4 oz (87.3 kg) (02/26 0437) Last BM Date: 01/21/15  Intake/Output from previous day: 02/25 0701 - 02/26 0700 In: 2068 [P.O.:60; I.V.:900; TPN:1103] Out: 183 [Urine:130; Drains:3; Blood:50] Intake/Output this shift:    PE: Gen:  Alert, NAD, pleasant Abd: Soft, mild tenderness, ND, +BS, no HSM, incisions C/D/I, drain with frank stool drainage with foul smell, foul stool drainage in coated around groin and on gown  Lab Results:   Recent Labs  01/21/15 0600  WBC 10.1  HGB 10.0*  HCT 31.5*  PLT 214   BMET  Recent Labs  01/21/15 0600 01/22/15 0550  NA 135 131*  K 4.1 4.5  CL 99 100  CO2 29 27  GLUCOSE 170* 183*  BUN 14 15  CREATININE 0.60 0.51  CALCIUM 8.4 7.9*   PT/INR No results for input(s): LABPROT, INR in the last 72 hours. CMP     Component Value Date/Time   NA 131* 01/22/2015 0550   NA 138 08/25/2014 1341   K 4.5 01/22/2015 0550   K 4.0 08/25/2014 1341   CL 100 01/22/2015 0550   CO2 27 01/22/2015 0550   CO2 28 08/25/2014 1341   GLUCOSE 183* 01/22/2015 0550   GLUCOSE 177* 08/25/2014 1341   BUN 15 01/22/2015 0550   BUN 20.4 08/25/2014 1341   CREATININE 0.51 01/22/2015 0550   CREATININE 0.8 08/25/2014 1341   CALCIUM 7.9* 01/22/2015 0550   CALCIUM 9.7 08/25/2014 1341   PROT 6.1 01/21/2015 0600   PROT 7.6 08/25/2014 1341   ALBUMIN 1.8*  01/21/2015 0600   ALBUMIN 3.2* 08/25/2014 1341   AST 24 01/21/2015 0600   AST 12 08/25/2014 1341   ALT 10 01/21/2015 0600   ALT 11 08/25/2014 1341   ALKPHOS 82 01/21/2015 0600   ALKPHOS 114 08/25/2014 1341   BILITOT 0.2* 01/21/2015 0600   BILITOT 0.60 08/25/2014 1341   GFRNONAA >90 01/22/2015 0550   GFRAA >90 01/22/2015 0550   Lipase  No results found for: LIPASE     Studies/Results: No results found.  Anti-infectives: Anti-infectives    Start     Dose/Rate Route Frequency Ordered Stop   01/17/15 2100  ertapenem [facility]) 1 g in sodium chloride 0.9 % 50 mL IVPB     1 g 100 mL/hr over 30 Minutes Intravenous Every 24 hours 01/17/15 2012     01/16/15 2100  metroNIDAZOLE (FLAGYL) IVPB 500 mg  Status:  Discontinued     500 mg 100 mL/hr over 60 Minutes Intravenous Every 8 hours 01/16/15 2026 01/17/15 1019   01/16/15 0315  piperacillin-tazobactam (ZOSYN) IVPB 3.375 g  Status:  Discontinued     3.375 g 12.5 mL/hr over 240 Minutes Intravenous Every 8 hours 01/16/15 0301 01/17/15 2012   01/15/15 1830  piperacillin-tazobactam (ZOSYN) IVPB 3.375 g     3.375  g 100 mL/hr over 30 Minutes Intravenous  Once 01/15/15 1807 01/15/15 2059       Assessment/Plan Diverticulitis with colovesical fistula POD #1 s/p Diverting loop colostomy (Dr. Hulen Skains) -Clear liquid diet until nausea resolves, IVF, pain control, antiemetics -IV antibiotics (Invanz Day #6) -Ambulate and IS -WOC nursing -Feeding supplements PCM -TPN until taking PO well Urinary incontinence related to foley -Try larger foley -Flush q4h to prevent clogging VTE proph -lovenox, SCD's    LOS: 7 days    DORT, Nathin Saran 01/22/2015, 9:37 AM Pager: [phone]

## 2015-01-22 NOTE — Progress Notes (Signed)
Foley continues to leak- CCS- general surgery has been paged this morning. Awaiting any call back. Will pass on to day RN.

## 2015-01-22 NOTE — Progress Notes (Signed)
Patient's daughter states that she has informed staff that her mother's foley has been leaking for the past couple of days. RN pulled 10cc from foley balloon to check leakage- applied 10cc back into balloon. Will continue to monitor foley at this time.

## 2015-01-22 NOTE — Progress Notes (Signed)
NUTRITION FOLLOW UP  Intervention:   -TPN management per pharmacy -Continue Resource Breeze po TID, each supplement provides 250 kcal and 9 grams of protein -RD will continue to follow for diet advancement  Nutrition Dx:   Inadequate oral intake related to altered GI function as evidenced by clear liquids, on TPN; ongoing  Goal:   Pt will meet >90% of estimated nutritional needs; met with TPN  Monitor:   TPN adequacy, diet advancement, PO/supplement intake, labs, weight changes, I/O's  Assessment:   79 year old female with a history of recurrent UTI, hypertension, diet-controlled diabetes presents in transfer from [place] where she was found to have an intra-abdominal abscess. The patient has been ill for the last 1 month and have been suffering from recurrent urinary tract infections since November of last year. Over the course of the last month the patient has had suprapubic abdominal pain and fecal material in the urine  S/p Procedure(s) on 01/21/15: DIVERTING LOOP COLOSTOMY  Pt asleep at time of visit. Per RN, pt recently received pain medication. Pt remains on clear liquid diet and intake is variable (PO: 0-80%). Also continues on [employer] supplement TID- per [facility], has been accepting well.  Pt remains on TPN- receiving Clinimix E 5/20 at 72m/hr + 20% IVFE at 110mhr-- this is goal rate which provides 84g protein and 1958kCal daily, meeting 100% of estimated kcal and protein needs.  Labs reviewed. Na: 131, Calcium: 7.9, Glucose: 183, CBGS: 121-206. Mg, K, and Phos WDL.   Height: Ht Readings from Last 1 Encounters:  01/15/15 _0  (1.6 m)    Weight Status:   Wt Readings from Last 1 Encounters:  01/22/15 192 lb 7.4 oz (87.3 kg)   01/18/15 188 lb 0.8 oz (85.3 kg)        Re-estimated needs:  Kcal: 1800-2000 Protein: 75-85 grams Fluid: 1.8-2.0 L  Skin: MASD on buttocks, closed abdominal incision, closed sytem drain to medial abdomen  Diet Order: TPN  (CLINIMIX-E) Adult Diet clear liquid TPN (CLINIMIX-E) Adult   Intake/Output Summary (Last 24 hours) at 01/22/15 1345 Last data filed at 01/22/15 0743  Gross per 24 hour  Intake    708 ml  Output      3 ml  Net    705 ml    Last BM: 01/21/15   Labs:   Recent Labs Lab 01/20/15 0500 01/21/15 0600 01/22/15 0550  NA 134* 135 131*  K 3.8 4.1 4.5  CL 104 99 100  CO2 _1 BUN _2 CREATININE 0.47* 0.60 0.51  CALCIUM 8.1* 8.4 7.9*  MG 1.8 1.9 1.9  PHOS 3.4 3.6 3.6  GLUCOSE 167* 170* 183*    CBG (last 3)   Recent Labs  01/22/15 0423 01/22/15 0846 01/22/15 1147  GLUCAP 177* 121* 206*    Scheduled Meds: . ALPRAZolam  0.25 mg Oral BID  . antiseptic oral rinse  7 mL Mouth Rinse q12n4p  . chlorhexidine  15 mL Mouth Rinse BID  . enoxaparin (LOVENOX) injection  40 mg Subcutaneous Q24H  . ertapenem  1 g Intravenous Q24H  . feeding supplement (RESOURCE BREEZE)  1 Container Oral TID BM  . insulin aspart  0-15 Units Subcutaneous 6 times per day  . losartan  100 mg Oral Daily  . mirabegron ER  50 mg Oral Daily  . sodium chloride  10-40 mL Intracatheter Q12H  . sodium chloride  3 mL Intravenous Q12H    Continuous Infusions: . .TMarland KitchenN (CLINIMIX-E) Adult  70 mL/hr at 01/21/15 1722   And  . fat emulsion 240 mL (01/21/15 1722)  . Marland KitchenTPN (CLINIMIX-E) Adult     And  . fat emulsion    . lactated ringers      Maxden Naji A. Jimmye Norman, RD, LDN, CDE Pager: [phone] After hours Pager: [phone]

## 2015-01-22 NOTE — Consult Note (Signed)
WOC ostomy follow up Stoma type/location: LLQ, loop colostomy Stomal assessment/size: 1 5/8" x 2" oval to accommodate support rod, pink, moist budded but right now support rod is very tight and causing dips in skin.  Peristomal assessment: intact  Treatment options for stomal/peristomal skin: added 1/2 of 2" barrier ring in the dips over the support rod to attempt to aid in seal  Output bloody Ostomy pouching: 1pc.flexible flat used with 2" barrier ring.  Education provided: daughter in law at the bedside today for teaching, she will be Michele Palmer's support at home as she lives next door.  Pt lives at home with husband however he will not be able to assist her.  Educational materials reviewed and pointed out to daughter in Sports coach.  Pt did observe pouch change with her family today but did not participate due to her pain level at the time of my visit.  Daughter in law very engaged at bedside and appears to be one that would learn pouch changes.  I have demonstrated pouch change today and the additional of the barrier ring due to the presence of the rod.  Answered questions from the daughter in law.  Enrolled patient in [place] Start Discharge program: Yes Would recommend [facility] for continued support for ostomy teaching and care.   [place] team will follow along with you for support with ostomy care and teaching.    [place], [place]

## 2015-01-23 LAB — CULTURE, BLOOD (ROUTINE X 2)
CULTURE: NO GROWTH
Culture: NO GROWTH

## 2015-01-23 LAB — GLUCOSE, CAPILLARY
GLUCOSE-CAPILLARY: 145 mg/dL — AB (ref 70–99)
GLUCOSE-CAPILLARY: 181 mg/dL — AB (ref 70–99)
Glucose-Capillary: 143 mg/dL — ABNORMAL HIGH (ref 70–99)
Glucose-Capillary: 159 mg/dL — ABNORMAL HIGH (ref 70–99)
Glucose-Capillary: 175 mg/dL — ABNORMAL HIGH (ref 70–99)
Glucose-Capillary: 224 mg/dL — ABNORMAL HIGH (ref 70–99)

## 2015-01-23 MED ORDER — FAT EMULSION 20 % IV EMUL
240.0000 mL | INTRAVENOUS | Status: AC
  Administered 2015-01-23: 240 mL via INTRAVENOUS
  Filled 2015-01-23: qty 250

## 2015-01-23 MED ORDER — TRACE MINERALS CR-CU-F-FE-I-MN-MO-SE-ZN IV SOLN
INTRAVENOUS | Status: AC
  Administered 2015-01-23: 18:00:00 via INTRAVENOUS
  Filled 2015-01-23: qty 1680

## 2015-01-23 NOTE — Progress Notes (Signed)
2 Days Post-Op   Subjective: Pt with some con't pain from foley Tol PO  Objective: Vital signs in last 24 hours: Temp:  [98.2 F (36.8 C)-98.4 F (36.9 C)] 98.2 F (36.8 C) (02/27 0601) Pulse Rate:  [87-95] 87 (02/27 0601) Resp:  [16-18] 18 (02/27 0601) BP: (104-118)/(50-66) 104/50 mmHg (02/27 0601) SpO2:  [90 %-92 %] 92 % (02/27 0601) Weight:  [192 lb 14.4 oz (87.5 kg)] 192 lb 14.4 oz (87.5 kg) (02/27 0601) Last BM Date: 01/21/15  Intake/Output from previous day: 02/26 0701 - 02/27 0700 In: 1888 [P.O.:840; I.V.:3; IV Piggyback:100; TPN:880] Out: 7078 [Urine:1705; Drains:10; Stool:50] Intake/Output this shift:    General appearance: alert and cooperative GI: ostomy pink/patent, wound c/d/i  Lab Results:   Recent Labs  01/21/15 0600  WBC 10.1  HGB 10.0*  HCT 31.5*  PLT 214   BMET  Recent Labs  01/21/15 0600 01/22/15 0550  NA 135 131*  K 4.1 4.5  CL 99 100  CO2 29 27  GLUCOSE 170* 183*  BUN 14 15  CREATININE 0.60 0.51  CALCIUM 8.4 7.9*    Anti-infectives: Anti-infectives    Start     Dose/Rate Route Frequency Ordered Stop   01/17/15 2100  ertapenem [facility]) 1 g in sodium chloride 0.9 % 50 mL IVPB     1 g 100 mL/hr over 30 Minutes Intravenous Every 24 hours 01/17/15 2012     01/16/15 2100  metroNIDAZOLE (FLAGYL) IVPB 500 mg  Status:  Discontinued     500 mg 100 mL/hr over 60 Minutes Intravenous Every 8 hours 01/16/15 2026 01/17/15 1019   01/16/15 0315  piperacillin-tazobactam (ZOSYN) IVPB 3.375 g  Status:  Discontinued     3.375 g 12.5 mL/hr over 240 Minutes Intravenous Every 8 hours 01/16/15 0301 01/17/15 2012   01/15/15 1830  piperacillin-tazobactam (ZOSYN) IVPB 3.375 g     3.375 g 100 mL/hr over 30 Minutes Intravenous  Once 01/15/15 1807 01/15/15 2059      Assessment/Plan: s/p Procedure(s): DIVERTING LOOP COLOSTOMY (N/A) Con't foley AMbulate Adv diet to FLD con't abx   LOS: 8 days    [name], [facility] 01/23/2015

## 2015-01-23 NOTE — Progress Notes (Addendum)
Received report from Night RN . Pt complaining of pain on perineal area , bladder distended a little output from foley cath. Renal scan done earlier with about 500 as reported Foley cath flushed and had return flow of about 600 . Discomfort lesser thereafter . Kept comfortable. Continue monitoring done  Md visited and made aware of above

## 2015-01-23 NOTE — Progress Notes (Signed)
PARENTERAL NUTRITION CONSULT NOTE - FOLLOW UP  Pharmacy Consult for TPN Indication: Diverticulitis with abscess, colovesical fistula  Allergies  Allergen Reactions  . Aspirin Other (See Comments)    Makes me "shaky"  . Ciprofloxacin Hives  . Codeine Other (See Comments)    Headache  . Sulfur Rash    Welps    Patient Measurements: Height: 5' 3"  (160 cm) Weight: 192 lb 14.4 oz (87.5 kg) IBW/kg (Calculated) : 52.4 Adjusted Body Weight: 66 kg  Vital Signs: Temp: 98.2 F (36.8 C) (02/27 0601) Temp Source: Oral (02/27 0601) BP: 104/50 mmHg (02/27 0601) Pulse Rate: 87 (02/27 0601) Intake/Output from previous day: 02/26 0701 - 02/27 0700 In: 1088 [P.O.:840; I.V.:3; IV Piggyback:100; TPN:110] Out: 6283 [Urine:1105; Drains:10; Stool:50]  Labs:  Recent Labs  01/21/15 0600  WBC 10.1  HGB 10.0*  HCT 31.5*  PLT 214    Recent Labs  01/21/15 0600 01/22/15 0550  NA 135 131*  K 4.1 4.5  CL 99 100  CO2 29 27  GLUCOSE 170* 183*  BUN 14 15  CREATININE 0.60 0.51  CALCIUM 8.4 7.9*  MG 1.9 1.9  PHOS 3.6 3.6  PROT 6.1  --   ALBUMIN 1.8*  --   AST 24  --   ALT 10  --   ALKPHOS 82  --   BILITOT 0.2*  --    Estimated Creatinine Clearance: 61.7 mL/min (by C-G formula based on Cr of 0.51).   Recent Labs  01/22/15 2004 01/23/15 0053 01/23/15 0420  GLUCAP 214* 159* 145*   Insulin Requirements in the past 24 hours:  13 units SSI  Current Nutrition:  -Clinimix E 5/20 at 70 ml/hr + 20% lipids at 10 ml/hr- provides 1958kCal and 84g of protein -Resource Breeze 1 container TID- each supplement provides 9g protein and 250kCal Was on Clear liquid (50% of breakfast charted yesterday, fair appetite), NPO after MN for surgery prep  Nutritional Goals:  1800-2000 kCal, 75-85 grams of protein per day as per RD recommendations 2/22  Assessment: [name] with a colovesical fistula. She started having issues in January and was treated with several courses of antibiotics for brown  urine with associated frequency and urgency. She underwent colonoscopy where fistula was found. CT revealed sigmoid diverticulitis abscess.   GI: Sigmoid diverticulitis with abscess and colovesical fistula. Percutaneous drain placed for abscess. She is now s/p surgery for a diverting loop colostomy.  She has a daughter in law who will be a caregiver at discharge.  Endo: hx DM (diet-controlled). CBGs 145-214 in last 24h.    Lytes: Electrolytes are stable with K 4.5, Mag 1.9, Phos 3.6, CorCa 10.1 yesterday - no new labs this morning.  Renal: SCr stable at 0.5. UOP 0.43m/kg/hr.  She continues on Mirabegron ER but is now off phenazopyridine.  Maintenance fluids are 0.9% NaCl at 540mhr  Pulm: 90/RA  Cards: VSS - no meds, BP slightly soft  Hepatobil: alk phos, AST, and ALT all WNL. TBili low at 0.2. Albumin low at 1.7. Baseline trigs 8678Neuro: A&O   ID: Diverticulitis with abscess, s/p drain placement 2/21. Flagyl and Zosyn changed to Invanz- D#6. Blood cultures both NGTD, urine and abscess cultures negative.  Will need to determine LOT with IV antibiotics  Best Practices: Lovenox, MC TPN Access: PICC placed 2/21 TPN day#: 5 (2/21>)  Plan:  -continue Clinimix E 5/20 at 7054mr + 20% IVFE at 19m86m-- this is goal rate which provides 84g protein and 1958kCal daily which meets 100%  of goals as per RD recommendations -multivitamin and full trace elements in TPN -continue CBGs, change sensitive SSI to moderate SSI-will continue to monitor closely and will add insulin to TPN if needed -BMET, mag and phos in the morning   Rober Minion, PharmD., MS Clinical Pharmacist Pager:  [phone] Thank you for allowing pharmacy to be part of this patients care team. 01/23/2015 7:49 AM

## 2015-01-24 LAB — GLUCOSE, CAPILLARY
GLUCOSE-CAPILLARY: 128 mg/dL — AB (ref 70–99)
GLUCOSE-CAPILLARY: 146 mg/dL — AB (ref 70–99)
GLUCOSE-CAPILLARY: 148 mg/dL — AB (ref 70–99)
GLUCOSE-CAPILLARY: 154 mg/dL — AB (ref 70–99)
Glucose-Capillary: 151 mg/dL — ABNORMAL HIGH (ref 70–99)
Glucose-Capillary: 152 mg/dL — ABNORMAL HIGH (ref 70–99)

## 2015-01-24 MED ORDER — TRACE MINERALS CR-CU-F-FE-I-MN-MO-SE-ZN IV SOLN
INTRAVENOUS | Status: AC
  Administered 2015-01-24: 17:00:00 via INTRAVENOUS
  Filled 2015-01-24: qty 984

## 2015-01-24 MED ORDER — FAT EMULSION 20 % IV EMUL
240.0000 mL | INTRAVENOUS | Status: DC
  Filled 2015-01-24: qty 250

## 2015-01-24 MED ORDER — TRACE MINERALS CR-CU-F-FE-I-MN-MO-SE-ZN IV SOLN
INTRAVENOUS | Status: DC
  Filled 2015-01-24: qty 1680

## 2015-01-24 MED ORDER — FAT EMULSION 20 % IV EMUL
240.0000 mL | INTRAVENOUS | Status: AC
  Administered 2015-01-24: 240 mL via INTRAVENOUS
  Filled 2015-01-24: qty 250

## 2015-01-24 MED ORDER — PANTOPRAZOLE SODIUM 40 MG PO TBEC
40.0000 mg | DELAYED_RELEASE_TABLET | Freq: Every day | ORAL | Status: DC
  Administered 2015-01-24 – 2015-02-05 (×13): 40 mg via ORAL
  Filled 2015-01-24 (×13): qty 1

## 2015-01-24 NOTE — Progress Notes (Addendum)
PARENTERAL NUTRITION CONSULT NOTE - FOLLOW UP  Pharmacy Consult for TPN Indication: Diverticulitis with abscess, colovesical fistula  Addendum: Asked to wean TPN - will decrease rate to 80ml/hr with plans to discontinue in the next 24-48 hours.   Allergies  Allergen Reactions  . Aspirin Other (See Comments)    Makes me "shaky"  . Ciprofloxacin Hives  . Codeine Other (See Comments)    Headache  . Sulfur Rash    Welps    Patient Measurements: Height: 5\' 3"  (160 cm) Weight: 195 lb 3.2 oz (88.542 kg) IBW/kg (Calculated) : 52.4 Adjusted Body Weight: 66 kg  Vital Signs: Temp: 99.3 F (37.4 C) (02/28 0624) Temp Source: Oral (02/28 0624) BP: 129/53 mmHg (02/28 0659) Pulse Rate: 86 (02/28 0624) Intake/Output from previous day: 02/27 0701 - 02/28 0700 In: 930 [P.O.:870] Out: 1225 [Urine:1185; Drains:10; Stool:30]  Labs: No results for input(s): WBC, HGB, HCT, PLT, APTT, INR in the last 72 hours.  Recent Labs  01/22/15 0550  NA 131*  K 4.5  CL 100  CO2 27  GLUCOSE 183*  BUN 15  CREATININE 0.51  CALCIUM 7.9*  MG 1.9  PHOS 3.6   Estimated Creatinine Clearance: 62.1 mL/min (by C-G formula based on Cr of 0.51).   Recent Labs  01/23/15 1700 01/23/15 2003 01/23/15 2358  GLUCAP 175* 224* 154*   Insulin Requirements in the past 24 hours:  10 units SSI  Current Nutrition:  -Clinimix E 5/20 at 70 ml/hr + 20% lipids at 10 ml/hr- provides 1958kCal and 84g of protein -Resource Breeze 1 container TID- each supplement provides 9g protein and 250kCal - She has been refusing some of these. NPO after surgery - Advancing diet today to full liquids  Nutritional Goals:  1800-2000 kCal, 75-85 grams of protein per day as per RD recommendations 2/22  Assessment: [name] with a colovesical fistula. She started having issues in January and was treated with several courses of antibiotics for brown urine with associated frequency and urgency. She underwent colonoscopy where fistula  was found. CT revealed sigmoid diverticulitis abscess.   GI: Sigmoid diverticulitis with abscess and colovesical fistula. Percutaneous drain placed for abscess. She is now s/p surgery for a diverting loop colostomy.  She has a daughter in law who will be a caregiver at discharge.  Endo: hx DM (diet-controlled). CBGs 154-224 in last 24h.  She received 10 units additional insulin which is down from yesterday  Lytes: No new labs this morning  Renal: SCr stable at 0.5. UOP 0.57mL/kg/hr.  She continues on Mirabegron ER but is now off phenazopyridine.  Maintenance fluids are 0.9% NaCl at 67ml/hr  Pulm: 90/RA  Cards: VSS - no meds, BP slightly soft  Hepatobil: F/U labs as available  Neuro: A&O   ID: Diverticulitis with abscess, s/p drain placement 2/21. Flagyl and Zosyn changed to Invanz- D#6. Blood cultures both NGTD, urine and abscess cultures negative.  Will need to determine LOT with IV antibiotics  Best Practices: Lovenox, MC TPN Access: PICC placed 2/21 TPN day#: 5 (2/21>)  Plan:  -continue Clinimix E 5/20 at 73mL/hr + 20% IVFE at 84mL/hr-- this is goal rate which provides 84g protein and 1958kCal daily which meets 100% of goals as per RD recommendations -multivitamin and full trace elements in TPN -Continue SSI and CBG's -will continue to monitor closely and will add insulin to TPN if they continue to trend >200 -f/u labs in AM   Rober Minion, PharmD., MS Clinical Pharmacist Pager:  [phone] Thank you for  allowing pharmacy to be part of this patients care team. 01/24/2015 7:54 AM

## 2015-01-24 NOTE — Progress Notes (Signed)
3 Days Post-Op  Subjective: Pt doing well.  Still having foley cath clogged.  Tol some PO   Objective: Vital signs in last 24 hours: Temp:  [97.6 F (36.4 C)-99.3 F (37.4 C)] 99.3 F (37.4 C) (02/28 0624) Pulse Rate:  [81-89] 86 (02/28 0624) Resp:  [16-18] 17 (02/28 0624) BP: (98-133)/(49-62) 129/53 mmHg (02/28 0659) SpO2:  [93 %-98 %] 98 % (02/28 0624) Weight:  [195 lb 3.2 oz (88.542 kg)] 195 lb 3.2 oz (88.542 kg) (02/28 0640) Last BM Date: 01/24/15  Intake/Output from previous day: 02/27 0701 - 02/28 0700 In: 930 [P.O.:870] Out: 1225 [Urine:1185; Drains:10; Stool:30] Intake/Output this shift: Total I/O In: 145 [Other:145] Out: 90 [Urine:90]  General appearance: alert and cooperative GI: soft, approp ttp, wound c/d/i, ostomy patent   Lab Results:  No results for input(s): WBC, HGB, HCT, PLT in the last 72 hours. BMET  Recent Labs  01/22/15 0550  NA 131*  K 4.5  CL 100  CO2 27  GLUCOSE 183*  BUN 15  CREATININE 0.51  CALCIUM 7.9*    Anti-infectives: Anti-infectives    Start     Dose/Rate Route Frequency Ordered Stop   01/17/15 2100  ertapenem [facility]) 1 g in sodium chloride 0.9 % 50 mL IVPB     1 g 100 mL/hr over 30 Minutes Intravenous Every 24 hours 01/17/15 2012     01/16/15 2100  metroNIDAZOLE (FLAGYL) IVPB 500 mg  Status:  Discontinued     500 mg 100 mL/hr over 60 Minutes Intravenous Every 8 hours 01/16/15 2026 01/17/15 1019   01/16/15 0315  piperacillin-tazobactam (ZOSYN) IVPB 3.375 g  Status:  Discontinued     3.375 g 12.5 mL/hr over 240 Minutes Intravenous Every 8 hours 01/16/15 0301 01/17/15 2012   01/15/15 1830  piperacillin-tazobactam (ZOSYN) IVPB 3.375 g     3.375 g 100 mL/hr over 30 Minutes Intravenous  Once 01/15/15 1807 01/15/15 2059      Assessment/Plan: s/p Procedure(s): DIVERTING LOOP COLOSTOMY (N/A) d/c foley  Con't PO as tol Wean TPN  LOS: 9 days    [name], [name] 01/24/2015

## 2015-01-25 ENCOUNTER — Inpatient Hospital Stay (HOSPITAL_COMMUNITY): Payer: Medicare Other

## 2015-01-25 LAB — GLUCOSE, CAPILLARY
Glucose-Capillary: 115 mg/dL — ABNORMAL HIGH (ref 70–99)
Glucose-Capillary: 124 mg/dL — ABNORMAL HIGH (ref 70–99)
Glucose-Capillary: 131 mg/dL — ABNORMAL HIGH (ref 70–99)
Glucose-Capillary: 133 mg/dL — ABNORMAL HIGH (ref 70–99)
Glucose-Capillary: 141 mg/dL — ABNORMAL HIGH (ref 70–99)

## 2015-01-25 LAB — URINALYSIS, ROUTINE W REFLEX MICROSCOPIC
BILIRUBIN URINE: NEGATIVE
Glucose, UA: NEGATIVE mg/dL
Ketones, ur: NEGATIVE mg/dL
Nitrite: NEGATIVE
Protein, ur: NEGATIVE mg/dL
SPECIFIC GRAVITY, URINE: 1.011 (ref 1.005–1.030)
Urobilinogen, UA: 0.2 mg/dL (ref 0.0–1.0)
pH: 7 (ref 5.0–8.0)

## 2015-01-25 LAB — URINE MICROSCOPIC-ADD ON

## 2015-01-25 LAB — TRIGLYCERIDES: Triglycerides: 184 mg/dL — ABNORMAL HIGH (ref <150)

## 2015-01-25 LAB — COMPREHENSIVE METABOLIC PANEL
ALK PHOS: 98 U/L (ref 39–117)
ALT: 11 U/L (ref 0–35)
ANION GAP: 2 — AB (ref 5–15)
AST: 23 U/L (ref 0–37)
Albumin: 1.6 g/dL — ABNORMAL LOW (ref 3.5–5.2)
BUN: 14 mg/dL (ref 6–23)
CO2: 33 mmol/L — AB (ref 19–32)
Calcium: 8.7 mg/dL (ref 8.4–10.5)
Chloride: 99 mmol/L (ref 96–112)
Creatinine, Ser: 0.63 mg/dL (ref 0.50–1.10)
GFR, EST NON AFRICAN AMERICAN: 84 mL/min — AB (ref >90)
GLUCOSE: 126 mg/dL — AB (ref 70–99)
POTASSIUM: 4.3 mmol/L (ref 3.5–5.1)
SODIUM: 134 mmol/L — AB (ref 135–145)
Total Bilirubin: 0.1 mg/dL — ABNORMAL LOW (ref 0.3–1.2)
Total Protein: 5.8 g/dL — ABNORMAL LOW (ref 6.0–8.3)

## 2015-01-25 LAB — MAGNESIUM: MAGNESIUM: 1.7 mg/dL (ref 1.5–2.5)

## 2015-01-25 LAB — PREALBUMIN: Prealbumin: 8.8 mg/dL — ABNORMAL LOW (ref 17.0–34.0)

## 2015-01-25 LAB — PHOSPHORUS: Phosphorus: 4.7 mg/dL — ABNORMAL HIGH (ref 2.3–4.6)

## 2015-01-25 MED ORDER — IOHEXOL 300 MG/ML  SOLN
50.0000 mL | Freq: Once | INTRAMUSCULAR | Status: AC | PRN
  Administered 2015-01-25: 5 mL

## 2015-01-25 MED ORDER — OXYCODONE-ACETAMINOPHEN 5-325 MG PO TABS
1.0000 | ORAL_TABLET | ORAL | Status: DC | PRN
  Administered 2015-01-25 – 2015-01-26 (×2): 2 via ORAL
  Administered 2015-01-26 – 2015-01-29 (×7): 1 via ORAL
  Filled 2015-01-25 (×4): qty 1
  Filled 2015-01-25: qty 2
  Filled 2015-01-25 (×2): qty 1
  Filled 2015-01-25 (×3): qty 2
  Filled 2015-01-25: qty 1

## 2015-01-25 MED ORDER — FAT EMULSION 20 % IV EMUL
240.0000 mL | INTRAVENOUS | Status: AC
  Administered 2015-01-25: 240 mL via INTRAVENOUS
  Filled 2015-01-25: qty 250

## 2015-01-25 MED ORDER — TRACE MINERALS CR-CU-F-FE-I-MN-MO-SE-ZN IV SOLN
INTRAVENOUS | Status: AC
  Administered 2015-01-25: 17:00:00 via INTRAVENOUS
  Filled 2015-01-25 (×2): qty 420

## 2015-01-25 NOTE — Progress Notes (Signed)
Physical Therapy Treatment Patient Details Name: Michele Palmer MRN: [id number] DOB: 1936-07-10 Today's Date: 01/25/2015    History of Present Illness Abdominal pain. 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  Pt had diverting loop colostomy 01/21/15.    PT Comments    Pt did much better today and was able to walk 190' with RW and min/guard A.  Pt requires encouragement, but is agreeable and once moving appears to be glad she is participating.  Pt educated on sitting up in recliner for 90-120 minutes and then to get up later in the afternoon with nursing as well. Con't to recommend home with HHPT, RW, and family support.  Follow Up Recommendations  Home health PT     Equipment Recommendations  Rolling walker with 5" wheels    Recommendations for Other Services       Precautions / Restrictions Precautions Precautions: Fall Precaution Comments: JP drain,colostomy Restrictions Weight Bearing Restrictions: No    Mobility  Bed Mobility Overal bed mobility: Modified Independent Bed Mobility: Rolling;Sidelying to Sit Rolling: Modified independent (Device/Increase time) Sidelying to sit: Supervision   Sit to supine: Min assist   General bed mobility comments: Did well with log rolling and coming to sitting, but did rely heaviliy on rail  Transfers Overall transfer level: Needs assistance Equipment used: Rolling walker (2 wheeled) Transfers: Sit to/from Stand Sit to Stand: Min guard Stand pivot transfers: Min assist       General transfer comment: good use of hands  Ambulation/Gait Ambulation/Gait assistance: Min guard Ambulation Distance (Feet): 190 Feet Assistive device: Rolling walker (2 wheeled) Gait Pattern/deviations: Trunk flexed;Step-through pattern Gait velocity: decreased   General Gait Details: Pt keeps trunk flexed which she reports is because she is "giving into the pain".  RW too far  forward.   Stairs            Wheelchair Mobility    Modified Rankin (Stroke Patients Only)       Balance     Sitting balance-Leahy Scale: Fair     Standing balance support: Bilateral upper extremity supported Standing balance-Leahy Scale: Fair                      Cognition Arousal/Alertness: Awake/alert Behavior During Therapy: WFL for tasks assessed/performed Overall Cognitive Status: Within Functional Limits for tasks assessed                      Exercises      General Comments General comments (skin integrity, edema, etc.): colostomy and JP drain in place.  Pt appears to be feeling better than last week.      Pertinent Vitals/Pain Pain Assessment:  (did not rate but continued to request pain meds) Pain Score: 7  Pain Location: abd Pain Descriptors / Indicators: Sore;Pressure Pain Intervention(s): Limited activity within patient's tolerance;Relaxation;Patient requesting pain meds-RN notified    Home Living                      Prior Function            PT Goals (current goals can now be found in the care plan section) Acute Rehab PT Goals Patient Stated Goal: to get stronger PT Goal Formulation: With patient/family Time For Goal Achievement: 02/01/15 Potential to Achieve Goals: Good Progress towards PT goals: Progressing toward goals    Frequency  Min 3X/week    PT Plan  Current plan remains appropriate    Co-evaluation             End of Session Equipment Utilized During Treatment: Gait belt Activity Tolerance: Patient tolerated treatment well Patient left: in chair;with call bell/phone within reach     Time: 0854-0920 PT Time Calculation (min) (ACUTE ONLY): 26 min  Charges:  $Gait Training: 23-37 mins                    G Codes:     Austine Kelsay L. Tamala Julian, [place] Pager [phone] 01/25/2015  Michele Palmer 01/25/2015, 10:40 AM

## 2015-01-25 NOTE — Progress Notes (Signed)
Patient ID: Michele Palmer, female   DOB: Apr 09, 1936, 79 y.o.   MRN: [id number] 4 Days Post-Op  Subjective: Pt feels ok this morning.  Having some pain.  Voiding well without the catheter.  Tolerating her full liquids, but not eating much at all  Objective: Vital signs in last 24 hours: Temp:  [98.3 F (36.8 C)-99.1 F (37.3 C)] 98.3 F (36.8 C) (02/29 0625) Pulse Rate:  [90-95] 92 (02/29 0625) Resp:  [16-18] 16 (02/29 0625) BP: (123-160)/(69-98) 150/98 mmHg (02/29 0625) SpO2:  [95 %-98 %] 97 % (02/29 0625) Weight:  [195 lb 1.6 oz (88.497 kg)] 195 lb 1.6 oz (88.497 kg) (02/29 0635) Last BM Date: 01/24/15  Intake/Output from previous day: 02/28 0701 - 02/29 0700 In: 1056 [P.O.:900; I.V.:6] Out: 1275 [Urine:1265; Drains:10] Intake/Output this shift:    PE: Abd: soft, appropriately tender, +BS, liquid output in her colostomy, stoma is pink and viable.  Midline wound is closed with staples and honeycomb dressing in place.  Drain from IR with cloudy serosang output (10cc/24hr) Heart: regular Lungs: CTAB  Lab Results:  No results for input(s): WBC, HGB, HCT, PLT in the last 72 hours. BMET  Recent Labs  01/25/15 0440  NA 134*  K 4.3  CL 99  CO2 33*  GLUCOSE 126*  BUN 14  CREATININE 0.63  CALCIUM 8.7   PT/INR No results for input(s): LABPROT, INR in the last 72 hours. CMP     Component Value Date/Time   NA 134* 01/25/2015 0440   NA 138 08/25/2014 1341   K 4.3 01/25/2015 0440   K 4.0 08/25/2014 1341   CL 99 01/25/2015 0440   CO2 33* 01/25/2015 0440   CO2 28 08/25/2014 1341   GLUCOSE 126* 01/25/2015 0440   GLUCOSE 177* 08/25/2014 1341   BUN 14 01/25/2015 0440   BUN 20.4 08/25/2014 1341   CREATININE 0.63 01/25/2015 0440   CREATININE 0.8 08/25/2014 1341   CALCIUM 8.7 01/25/2015 0440   CALCIUM 9.7 08/25/2014 1341   PROT 5.8* 01/25/2015 0440   PROT 7.6 08/25/2014 1341   ALBUMIN 1.6* 01/25/2015 0440   ALBUMIN 3.2* 08/25/2014 1341   AST 23 01/25/2015 0440   AST 12  08/25/2014 1341   ALT 11 01/25/2015 0440   ALT 11 08/25/2014 1341   ALKPHOS 98 01/25/2015 0440   ALKPHOS 114 08/25/2014 1341   BILITOT 0.1* 01/25/2015 0440   BILITOT 0.60 08/25/2014 1341   GFRNONAA 84* 01/25/2015 0440   GFRAA >90 01/25/2015 0440   Lipase  No results found for: LIPASE     Studies/Results: No results found.  Anti-infectives: Anti-infectives    Start     Dose/Rate Route Frequency Ordered Stop   01/17/15 2100  ertapenem [facility]) 1 g in sodium chloride 0.9 % 50 mL IVPB     1 g 100 mL/hr over 30 Minutes Intravenous Every 24 hours 01/17/15 2012     01/16/15 2100  metroNIDAZOLE (FLAGYL) IVPB 500 mg  Status:  Discontinued     500 mg 100 mL/hr over 60 Minutes Intravenous Every 8 hours 01/16/15 2026 01/17/15 1019   01/16/15 0315  piperacillin-tazobactam (ZOSYN) IVPB 3.375 g  Status:  Discontinued     3.375 g 12.5 mL/hr over 240 Minutes Intravenous Every 8 hours 01/16/15 0301 01/17/15 2012   01/15/15 1830  piperacillin-tazobactam (ZOSYN) IVPB 3.375 g     3.375 g 100 mL/hr over 30 Minutes Intravenous  Once 01/15/15 1807 01/15/15 2059       Assessment/Plan  Diverticulitis with colovesical fistula POD #4 s/p Diverting loop colostomy (Dr. Hulen Skains) -advance to carb mod diet today, IVF, pain control, antiemetics -IV antibiotics (Invanz Day #9/?) -Ambulate and IS -WOC nursing for colostomy -Feeding supplements, Resource Breeze -s/p perc drain on 2/20.  Will ask them to re-evaluate patient and consider drain injection to rule out a connection to the colon.  Last week her drain put out feculent appearing drainage.   PCM -TPN, decrease to half rate today UTI, secondary to colovesical fistula -check UA today to determine if she still has an infection.  This will hopefully help [place] determine or narrow down a course of abx therapy VTE proph -lovenox, SCD's HTN -on cozaar Borderline Diabetes Mellitus -discussed with Dr. Sloan Leiter.  He would not initiate treatment given this  is borderline.  hgba1c is 6.5.  She will just need to follow up with her PCP as an outpatient for further management.  LOS: 10 days    Aragorn Recker E 01/25/2015, 9:49 AM Pager: [phone]

## 2015-01-25 NOTE — Procedures (Signed)
Proc - Pelvic abscess drain injection  Find - No fistula  No comp

## 2015-01-25 NOTE — Progress Notes (Signed)
PARENTERAL NUTRITION CONSULT NOTE - FOLLOW UP  Pharmacy Consult for TPN Indication: Diverticulitis with abscess, colovesical fistula   Allergies  Allergen Reactions  . Aspirin Other (See Comments)    Makes me "shaky"  . Ciprofloxacin Hives  . Codeine Other (See Comments)    Headache  . Sulfur Rash    Welps    Patient Measurements: Height: 5\' 3"  (160 cm) Weight: 195 lb 1.6 oz (88.497 kg) IBW/kg (Calculated) : 52.4 Adjusted Body Weight: 66 kg  Vital Signs: Temp: 98.3 F (36.8 C) (02/29 0625) Temp Source: Oral (02/29 0625) BP: 150/98 mmHg (02/29 0625) Pulse Rate: 92 (02/29 0625) Intake/Output from previous day: 02/28 0701 - 02/29 0700 In: 8413 [P.O.:900; I.V.:6] Out: 1275 [Urine:1265; Drains:10]  Labs: No results for input(s): WBC, HGB, HCT, PLT, APTT, INR in the last 72 hours.  Recent Labs  01/25/15 0440  NA 134*  K 4.3  CL 99  CO2 33*  GLUCOSE 126*  BUN 14  CREATININE 0.63  CALCIUM 8.7  MG 1.7  PHOS 4.7*  PROT 5.8*  ALBUMIN 1.6*  AST 23  ALT 11  ALKPHOS 98  BILITOT 0.1*  TRIG 184*   Estimated Creatinine Clearance: 62.1 mL/min (by C-G formula based on Cr of 0.63).   Recent Labs  01/25/15 0048 01/25/15 0419 01/25/15 0805  GLUCAP 124* 133* 131*   Insulin Requirements in the past 24 hours:  11 units SSI  Assessment: [name] with a colovesical fistula. She started having issues in January and was treated with several courses of antibiotics for brown urine with associated frequency and urgency. She underwent colonoscopy where fistula was found. CT revealed sigmoid diverticulitis abscess.   GI: Sigmoid diverticulitis with abscess and colovesical fistula. Percutaneous drain placed for abscess. She is now s/p surgery for a diverting loop colostomy.  She has a daughter in law who will be a caregiver at discharge. Tolerating full liquids but not eating much at all   Endo: hx DM (diet-controlled). CBGs 124-152 in last 24h. On SSI   Lytes: Na 134, Phos 4.7,  Mg 1.7, K 4.3   Renal: SCr stable 0.63. UOP 0.32mL/kg/hr.  She continues on Mirabegron ER but is now off phenazopyridine.  Maintenance fluids are LR at 68ml/hr  Pulm: 97/RA  Cards: BP slightly elevated, On losartan   Hepatobil: LFTs wnl   Neuro: A&O   ID: Diverticulitis with abscess, s/p drain placement 2/21. Flagyl and Zosyn changed to Invanz- D#7. Blood cultures neg, urine and abscess cultures negative.  Will need to determine LOT with IV antibiotics  Best Practices: Lovenox, MC, SCDs  TPN Access: PICC placed 2/21 TPN day#: 6 (2/21>)  Nutritional Goals:  1800-2000 kCal, 75-85 grams of protein per day as per RD recommendations 2/22  Current Nutrition:  -Clinimix E 5/20 at 41 ml/hr + 20% lipids at 10 ml/hr -Resource Breeze 1 container TID- each supplement provides 9g protein and 250kCal   Plan:  -Decrease Clinimix E 5/20 to 20 mL/hr + IVFE at 10 mL/hr. Possible d/c tomorrow -multivitamin and full trace elements in TPN -Continue SSI and CBG's -f/u labs in AM   Michele Palmer, PharmD., BCPS Clinical Pharmacist Phone [phone]

## 2015-01-25 NOTE — Progress Notes (Signed)
Calorie Count Note  48 hour calorie count ordered.  Spoke with pt who reports her appetite has not improved. She reports she thinks she cannot eat much "because my stomach shrunk". She continues to consume [employer] and would like to continue with supplement.   Diet: Carb Modified Supplements: Resource Breeze po TID, each supplement provides 250 kcal and 9 grams of protein  Also receiving TPN--Clinimix E 5/20 at 41 ml/hr + 20% lipids at 10 ml/hr , which provides 1346 kcals and 49 grams protein daily, which meets 75% of estimated kcals needs and 65% of estimated protein needs. Plan is to Decrease Clinimix E 5/20 to 20 mL/hr + IVFE at 10 mL/hr at 1800 (which provides 422 kcals and 24 grams of protein, which meets 23% of estimated kcal needs and 32% of estimated protein needs) and possible d/c tomorrow after bag is run.   Breakfast: n/a Lunch: 25% of dinner roll (20 kcals, and 0.6 grams protein), 25% beef stew (109 kcals and 11 grams protein), 25% green beans (11 kcals and 0.6 grams protein) Dinner: n/a Supplements: n/a  Total intake: 140 kcal (8% of minimum estimated needs) via meal intake; 2236 kcal (124% of minimum estimated needs) via meals, supplements, and TPN 12 protein (16% of minimum estimated needs) via meal intake; 79 protein (105% of minimum estimated needs) via meals, supplements and TPN  Nutrition Dx: Inadequate oral intake related to altered GI function as evidenced by PO <25%; ongoing  Goal: Pt will meet >90% of estimated nutritional needs  Intervention: TPN management per pharmacy. Continue with Resource Breeze po TID, each supplement provides 250 kcal and 9 grams of protein.  Educated pt on importance of good PO intake for healing.   Leana Springston A. Jimmye Norman, RD, LDN, CDE Pager: [phone] After hours Pager: [phone]

## 2015-01-25 NOTE — Progress Notes (Addendum)
Occupational Therapy Treatment Patient Details Name: Michele Palmer MRN: [id number] DOB: November 20, 1936 Today's Date: 01/25/2015    History of present illness Abdominal pain. 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  Pt had diverting loop colostomy 01/21/15.   OT comments  Pt. Progressing well with acute OT stated goals.  Currently min a for transfers and completion of toileting and ADLS.  Requires encouragement for participation as pain is a limiting factor for her, but does well once engaged in desired task.  Follow Up Recommendations  Home health OT    Equipment Recommendations  None recommended by OT    Recommendations for Other Services      Precautions / Restrictions Precautions Precautions: Fall Precaution Comments: JP drain,colostomy       Mobility Bed Mobility Overal bed mobility: Needs Assistance Bed Mobility: Sit to Supine       Sit to supine: Min assist   General bed mobility comments: min a to bring b les into bed but pt. able to scoot hips back in bed while in long sitting with intermittent use of bed rails to pull towards hob prior to lying down  Transfers Overall transfer level: Needs assistance Equipment used: Rolling walker (2 wheeled) Transfers: Sit to/from [facility] Sit to Stand: Min guard Stand pivot transfers: Min assist       General transfer comment: instuctional and tactile cues for walker management and sequencing of pivotal steps.  able to side step x5 steps towards hob prior to getting back into bed, no LOB noted    Balance     Sitting balance-Leahy Scale: Fair       Standing balance-Leahy Scale: Fair                     ADL Overall ADL's : Needs assistance/impaired     Grooming: Set up;Sitting   Upper Body Bathing: Set up;Sitting   Lower Body Bathing: Minimal assistance;Sit to/from stand   Upper Body Dressing : Minimal assistance;Sitting        Toilet Transfer: Minimal assistance;Cueing for sequencing;Stand-pivot;BSC;RW   Toileting- Clothing Manipulation and Hygiene: Set up;Sitting/lateral lean       Functional mobility during ADLs: Minimal assistance;Moderate assistance General ADL Comments: moves slow but tolerating increase activity well.  able to complete bathing and bsc toileting this session      Vision                     Perception     Praxis      Cognition   Behavior During Therapy: [facility] for tasks assessed/performed Overall Cognitive Status: Within Functional Limits for tasks assessed                       Extremity/Trunk Assessment               Exercises     Shoulder Instructions       General Comments      Pertinent Vitals/ Pain       Pain Assessment:  (did not rate but continued to request pain meds) Pain Intervention(s): Patient requesting pain meds-RN notified  Home Living                                          Prior Functioning/Environment  Frequency Min 2X/week     Progress Toward Goals  OT Goals(current goals can now be found in the care plan section)  Progress towards OT goals: Progressing toward goals     Plan Discharge plan remains appropriate    Co-evaluation                 End of Session Equipment Utilized During Treatment: Rolling walker   Activity Tolerance Patient tolerated treatment well   Patient Left in bed;with call bell/phone within reach   Nurse Communication Patient requests pain meds        Time: 7588-3254 OT Time Calculation (min): 23 min  Charges: OT General Charges $OT Visit: 1 Procedure OT Treatments $Self Care/Home Management : 23-37 mins  Janice Coffin, COTA/L 01/25/2015, 8:41 AM

## 2015-01-25 NOTE — Consult Note (Signed)
WOC visited with patient this am for possible pouch change, she is up in the chair.  We discussed that her daughter in law that observed the pouch change on Friday will be her support person at home.  The pouch is intact today and we have planned a pouch change tomorrow am with her daughter in law at 54 am.  I will verify with her son today that that time will work for tom.  [place] team will follow along with you for support with ostomy education and care. Jann Milkovich [place] RN,CWOCN  [phone]

## 2015-01-26 LAB — CBC
HCT: 26.9 % — ABNORMAL LOW (ref 36.0–46.0)
Hemoglobin: 8.5 g/dL — ABNORMAL LOW (ref 12.0–15.0)
MCH: 29.2 pg (ref 26.0–34.0)
MCHC: 31.6 g/dL (ref 30.0–36.0)
MCV: 92.4 fL (ref 78.0–100.0)
PLATELETS: 232 10*3/uL (ref 150–400)
RBC: 2.91 MIL/uL — ABNORMAL LOW (ref 3.87–5.11)
RDW: 14.7 % (ref 11.5–15.5)
WBC: 8.5 10*3/uL (ref 4.0–10.5)

## 2015-01-26 LAB — BASIC METABOLIC PANEL
Anion gap: 4 — ABNORMAL LOW (ref 5–15)
Anion gap: 8 (ref 5–15)
BUN: 13 mg/dL (ref 6–23)
BUN: 14 mg/dL (ref 6–23)
CHLORIDE: 97 mmol/L (ref 96–112)
CHLORIDE: 96 mmol/L (ref 96–112)
CO2: 33 mmol/L — ABNORMAL HIGH (ref 19–32)
CO2: 31 mmol/L (ref 19–32)
CREATININE: 0.71 mg/dL (ref 0.50–1.10)
Calcium: 9 mg/dL (ref 8.4–10.5)
Calcium: 9.1 mg/dL (ref 8.4–10.5)
Creatinine, Ser: 0.65 mg/dL (ref 0.50–1.10)
GFR calc Af Amer: >90 mL/min (ref >90)
GFR calc Af Amer: >90 mL/min (ref >90)
GFR calc non Af Amer: 81 mL/min — ABNORMAL LOW (ref >90)
GFR calc non Af Amer: 83 mL/min — ABNORMAL LOW (ref >90)
Glucose, Bld: 126 mg/dL — ABNORMAL HIGH (ref 70–99)
Glucose, Bld: 112 mg/dL — ABNORMAL HIGH (ref 70–99)
POTASSIUM: 4.3 mmol/L (ref 3.5–5.1)
Potassium: 4.3 mmol/L (ref 3.5–5.1)
Sodium: 134 mmol/L — ABNORMAL LOW (ref 135–145)
Sodium: 135 mmol/L (ref 135–145)

## 2015-01-26 LAB — GLUCOSE, CAPILLARY
GLUCOSE-CAPILLARY: 133 mg/dL — AB (ref 70–99)
Glucose-Capillary: 106 mg/dL — ABNORMAL HIGH (ref 70–99)
Glucose-Capillary: 109 mg/dL — ABNORMAL HIGH (ref 70–99)
Glucose-Capillary: 115 mg/dL — ABNORMAL HIGH (ref 70–99)
Glucose-Capillary: 117 mg/dL — ABNORMAL HIGH (ref 70–99)
Glucose-Capillary: 131 mg/dL — ABNORMAL HIGH (ref 70–99)

## 2015-01-26 MED ORDER — FAT EMULSION 20 % IV EMUL
240.0000 mL | INTRAVENOUS | Status: DC
  Administered 2015-01-26: 240 mL via INTRAVENOUS
  Filled 2015-01-26: qty 250

## 2015-01-26 MED ORDER — TRACE MINERALS CR-CU-F-FE-I-MN-MO-SE-ZN IV SOLN
INTRAVENOUS | Status: DC
  Administered 2015-01-26: 17:00:00 via INTRAVENOUS
  Filled 2015-01-26 (×2): qty 420

## 2015-01-26 NOTE — Progress Notes (Signed)
Calorie Count Note  48 hour calorie count ordered.  Diet: Carb Modified Supplements: Resource Breeze po TID, each supplement provides 250 kcal and 9 grams of protein  Pt also receiving Clinimix E 5/20 to 20 mL/hr + IVFE at 10 mL/hr at 1800 (which provides 422 kcals and 24 grams of protein, which meets 23% of estimated kcal needs and 32% of estimated protein needs) and possible d/c tomorrow after bag is run.   Breakfast: n/a Lunch: n/a Dinner: n/a Supplements: Resource Breeze po TID, each supplement provides 250 kcal and 9 grams of protein  Obtained meal tickets from calorie count envelope- only dinner ticket was available. Meal intake data incomplete. Documented meal intake reveals pt is now consuming 45-75% of meals (average 60% meal consumption). She is consuming [employer] supplements when offered. Per chart review, intake is improving.   Total intake: 1200 kcal (60% of minimum estimated needs)  60 protein (80% of minimum estimated needs)  Nutrition Dx: Inadequate oral intake related to altered GI function as evidenced by PO <25%; ongoing  Goal: Pt will meet >90% of estimated nutritional needs  Intervention: TPN management per pharmacy. Continue with Resource Breeze po TID, each supplement provides 250 kcal and 9 grams of protein.   Jera Headings A. Jimmye Norman, RD, LDN, CDE Pager: [phone] After hours Pager: [phone]

## 2015-01-26 NOTE — Progress Notes (Signed)
Physical Therapy Treatment Patient Details Name: Michele Palmer MRN: [id number] DOB: 18-Sep-1936 Today's Date: 01/26/2015    History of Present Illness Abdominal pain. 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  Pt had diverting loop colostomy 01/21/15.    PT Comments    Pt making good progress with ambulation.  She fatigues quickly and needs encouragement to go farther and to sit up out of bed.  Pt and son were educated on importance of OOB for strengthening and recovery.  Follow Up Recommendations  Home health PT     Equipment Recommendations  Rolling walker with 5" wheels    Recommendations for Other Services       Precautions / Restrictions Precautions Precautions: Fall Precaution Comments: colostomy Restrictions Weight Bearing Restrictions: No    Mobility  Bed Mobility Overal bed mobility: Modified Independent Bed Mobility: Rolling;Sidelying to Sit Rolling: Modified independent (Device/Increase time) Sidelying to sit: Modified independent (Device/Increase time)       General bed mobility comments: good body mechanics  Transfers Overall transfer level: Needs assistance Equipment used: Rolling walker (2 wheeled) Transfers: Sit to/from Stand Sit to Stand: Supervision         General transfer comment: good use of hands  Ambulation/Gait Ambulation/Gait assistance: Min guard;Supervision Ambulation Distance (Feet): 250 Feet Assistive device: Rolling walker (2 wheeled) Gait Pattern/deviations: Trunk flexed;Decreased step length - right;Decreased step length - left Gait velocity: decreased   General Gait Details: Amb intially with S but as she fatigued was Min/guard.  Fearful of falling backwards.  3 standing rest breaks and becoming weak near end of gait.   Stairs            Wheelchair Mobility    Modified Rankin (Stroke Patients Only)       Balance     Sitting balance-Leahy Scale:  Good     Standing balance support: Bilateral upper extremity supported Standing balance-Leahy Scale: Fair                      Cognition Arousal/Alertness: Awake/alert Behavior During Therapy: WFL for tasks assessed/performed Overall Cognitive Status: Within Functional Limits for tasks assessed                      Exercises      General Comments General comments (skin integrity, edema, etc.): Pt no longer has JP drain. Spoke with patient and son about stairs.  Son does not feel pt will have difficulty and that he will be able to help her.  Pt stated they may be putting up a rail.       Pertinent Vitals/Pain Pain Assessment: 0-10 Pain Score: 5  Pain Location: abd Pain Descriptors / Indicators: Sore Pain Intervention(s): Limited activity within patient's tolerance    Home Living                      Prior Function            PT Goals (current goals can now be found in the care plan section) Acute Rehab PT Goals Patient Stated Goal: to get stronger PT Goal Formulation: With patient/family Time For Goal Achievement: 02/01/15 Potential to Achieve Goals: Good Progress towards PT goals: Progressing toward goals    Frequency  Min 3X/week    PT Plan Current plan remains appropriate    Co-evaluation  End of Session Equipment Utilized During Treatment: Gait belt Activity Tolerance: Patient tolerated treatment well Patient left: in chair;with call bell/phone within reach;with family/visitor present     Time: 1230-1256 PT Time Calculation (min) (ACUTE ONLY): 26 min  Charges:  $Gait Training: 23-37 mins                    G Codes:      Michele Palmer, [place] Pager [phone] 01/26/2015  Michele Palmer 01/26/2015, 1:54 PM

## 2015-01-26 NOTE — Consult Note (Signed)
WOC ostomy follow up Stoma type/location: LLQ, loop colostomy Stomal assessment/size: 2" oval, flush with tension from support rod Peristomal assessment: gullies at 3 and 9 o'clock from support rod Treatment options for stomal/peristomal skin: added 1/2 of 2" barrier at 3 and 9 o'clock on the back of the flat wafer to even skin out more in these gullies.  Output liquid, brown effluent and flatus Ostomy pouching: 1pc.with 2" barrier ring cut in 1/2 Education provided:  Daughter in law at bedside for pouch change, she completed all steps of the pouch change independently and recalled each step from my demo on Friday. Provided patient and family with [facility] catelog and marked items needed. Would recommend [facility] for continued support for ostomy teaching.  Enrolled patient in [place] Start Discharge program: Yes  [place] team will follow along with you for support with ostomy care and teaching Jennilee Demarco [facility] [phone]

## 2015-01-26 NOTE — Progress Notes (Signed)
Patient ID: Michele Palmer, female   DOB: 10/20/1936, 79 y.o.   MRN: [id number]     [facility]      [address] [place], [place] [address]    Phone: [phone] FAX: [phone]     Subjective: Drain injected no fistula or abscess. 28m out.  Will DC.  Afebrile.  VSS.  Appetite is a little better.    Objective:  Vital signs:  Filed Vitals:   01/25/15 1046 01/25/15 1350 01/25/15 2100 01/26/15 0508  BP: 108/58 131/56 100/62 115/62  Pulse:  92 90 82  Temp:  98.4 F (36.9 C) 98.1 F (36.7 C) 98.3 F (36.8 C)  TempSrc:  Oral Oral Oral  Resp:  20 18 18   Height:      Weight:    193 lb 2 oz (87.6 kg)  SpO2:  98% 96% 98%    Last BM Date: 02/22/15  Intake/Output   Yesterday:  02/29 0701 - 03/01 0700 In: 1771.3 [I.V.:1191.7; IV Piggyback:50; TPN:524.6] Out: 12423[Urine:950; Drains:10; Stool:75] This shift:  Total I/O In: 5 [Other:5] Out: 250 [Urine:250]   Physical Exam: General: Pt awake/alert/oriented x4 in no acute distress Abdomen: Soft.  Nondistended. Mildly tender at incisions only. Ostomy functioning, stoma is pink and viable.  Midline wound is intact with approximated edges, staples.  No evidence of peritonitis.  No incarcerated hernias.    Problem List:   Principal Problem:   Intestinal diverticular abscess Active Problems:   Essential hypertension   Hypokalemia   Abdominal abscess   Colonic diverticular abscess   Anxiety state   Depression    Results:   Labs: Results for orders placed or performed during the hospital encounter of 01/15/15 (from the past 48 hour(s))  Glucose, capillary     Status: Abnormal   Collection Time: 01/24/15 12:15 PM  Result Value Ref Range   Glucose-Capillary 146 (H) 70 - 99 mg/dL  Glucose, capillary     Status: Abnormal   Collection Time: 01/24/15  4:50 PM  Result Value Ref Range   Glucose-Capillary 152 (H) 70 - 99 mg/dL  Glucose, capillary     Status: Abnormal   Collection Time: 01/24/15  8:11 PM  Result Value Ref Range   Glucose-Capillary 128 (H) 70 - 99 mg/dL  Glucose, capillary     Status: Abnormal   Collection Time: 01/25/15 12:48 AM  Result Value Ref Range   Glucose-Capillary 124 (H) 70 - 99 mg/dL   Comment 1 Notify RN    Comment 2 Documented in Char   Glucose, capillary     Status: Abnormal   Collection Time: 01/25/15  4:19 AM  Result Value Ref Range   Glucose-Capillary 133 (H) 70 - 99 mg/dL  Comprehensive metabolic panel     Status: Abnormal   Collection Time: 01/25/15  4:40 AM  Result Value Ref Range   Sodium 134 (L) 135 - 145 mmol/L   Potassium 4.3 3.5 - 5.1 mmol/L   Chloride 99 96 - 112 mmol/L   CO2 33 (H) 19 - 32 mmol/L   Glucose, Bld 126 (H) 70 - 99 mg/dL   BUN 14 6 - 23 mg/dL   Creatinine, Ser 0.63 0.50 - 1.10 mg/dL   Calcium 8.7 8.4 - 10.5 mg/dL   Total Protein 5.8 (L) 6.0 - 8.3 g/dL   Albumin 1.6 (L) 3.5 - 5.2 g/dL   AST 23 0 - 37 U/L   ALT 11 0 - 35 U/L  Alkaline Phosphatase 98 39 - 117 U/L   Total Bilirubin 0.1 (L) 0.3 - 1.2 mg/dL   GFR calc non Af Amer 84 (L) >90 mL/min   GFR calc Af Amer >90 >90 mL/min    Comment: (NOTE) The eGFR has been calculated using the CKD EPI equation. This calculation has not been validated in all clinical situations. eGFR's persistently <90 mL/min signify possible Chronic Kidney Disease.    Anion gap 2 (L) 5 - 15    Comment: REPEATED TO VERIFY RESULT CHECKED   Magnesium     Status: None   Collection Time: 01/25/15  4:40 AM  Result Value Ref Range   Magnesium 1.7 1.5 - 2.5 mg/dL  Phosphorus     Status: Abnormal   Collection Time: 01/25/15  4:40 AM  Result Value Ref Range   Phosphorus 4.7 (H) 2.3 - 4.6 mg/dL  Prealbumin     Status: Abnormal   Collection Time: 01/25/15  4:40 AM  Result Value Ref Range   Prealbumin 8.8 (L) 17.0 - 34.0 mg/dL    Comment: Performed at [facility]  Triglycerides     Status: Abnormal   Collection Time: 01/25/15  4:40 AM  Result Value Ref  Range   Triglycerides 184 (H) <150 mg/dL  Glucose, capillary     Status: Abnormal   Collection Time: 01/25/15  8:05 AM  Result Value Ref Range   Glucose-Capillary 131 (H) 70 - 99 mg/dL  Urinalysis, Routine w reflex microscopic     Status: Abnormal   Collection Time: 01/25/15  2:23 PM  Result Value Ref Range   Color, Urine YELLOW YELLOW   APPearance CLOUDY (A) CLEAR   Specific Gravity, Urine 1.011 1.005 - 1.030   pH 7.0 5.0 - 8.0   Glucose, UA NEGATIVE NEGATIVE mg/dL   Hgb urine dipstick LARGE (A) NEGATIVE   Bilirubin Urine NEGATIVE NEGATIVE   Ketones, ur NEGATIVE NEGATIVE mg/dL   Protein, ur NEGATIVE NEGATIVE mg/dL   Urobilinogen, UA 0.2 0.0 - 1.0 mg/dL   Nitrite NEGATIVE NEGATIVE   Leukocytes, UA LARGE (A) NEGATIVE  Urine microscopic-add on     Status: Abnormal   Collection Time: 01/25/15  2:23 PM  Result Value Ref Range   Squamous Epithelial / LPF RARE RARE   WBC, UA TOO NUMEROUS TO COUNT <3 WBC/hpf   RBC / HPF 11-20 <3 RBC/hpf   Bacteria, UA FEW (A) RARE  Glucose, capillary     Status: Abnormal   Collection Time: 01/25/15  4:30 PM  Result Value Ref Range   Glucose-Capillary 115 (H) 70 - 99 mg/dL  Glucose, capillary     Status: Abnormal   Collection Time: 01/25/15  7:55 PM  Result Value Ref Range   Glucose-Capillary 141 (H) 70 - 99 mg/dL  Glucose, capillary     Status: Abnormal   Collection Time: 01/26/15 12:11 AM  Result Value Ref Range   Glucose-Capillary 109 (H) 70 - 99 mg/dL  CBC     Status: Abnormal   Collection Time: 01/26/15  1:00 AM  Result Value Ref Range   WBC 8.5 4.0 - 10.5 K/uL   RBC 2.91 (L) 3.87 - 5.11 MIL/uL   Hemoglobin 8.5 (L) 12.0 - 15.0 g/dL   HCT 26.9 (L) 36.0 - 46.0 %   MCV 92.4 78.0 - 100.0 fL   MCH 29.2 26.0 - 34.0 pg   MCHC 31.6 30.0 - 36.0 g/dL   RDW 14.7 11.5 - 15.5 %   Platelets 232 150 -  400 K/uL  Basic metabolic panel     Status: Abnormal   Collection Time: 01/26/15  1:00 AM  Result Value Ref Range   Sodium 134 (L) 135 - 145 mmol/L    Potassium 4.3 3.5 - 5.1 mmol/L   Chloride 97 96 - 112 mmol/L   CO2 33 (H) 19 - 32 mmol/L   Glucose, Bld 126 (H) 70 - 99 mg/dL   BUN 13 6 - 23 mg/dL   Creatinine, Ser 0.71 0.50 - 1.10 mg/dL   Calcium 9.0 8.4 - 10.5 mg/dL   GFR calc non Af Amer 81 (L) >90 mL/min   GFR calc Af Amer >90 >90 mL/min    Comment: (NOTE) The eGFR has been calculated using the CKD EPI equation. This calculation has not been validated in all clinical situations. eGFR's persistently <90 mL/min signify possible Chronic Kidney Disease.    Anion gap 4 (L) 5 - 15  Glucose, capillary     Status: Abnormal   Collection Time: 01/26/15  3:49 AM  Result Value Ref Range   Glucose-Capillary 162 (H) 70 - 99 mg/dL  Glucose, capillary     Status: Abnormal   Collection Time: 01/26/15  4:37 AM  Result Value Ref Range   Glucose-Capillary 106 (H) 70 - 99 mg/dL  Basic metabolic panel     Status: Abnormal   Collection Time: 01/26/15  5:25 AM  Result Value Ref Range   Sodium 135 135 - 145 mmol/L   Potassium 4.3 3.5 - 5.1 mmol/L   Chloride 96 96 - 112 mmol/L   CO2 31 19 - 32 mmol/L   Glucose, Bld 112 (H) 70 - 99 mg/dL   BUN 14 6 - 23 mg/dL   Creatinine, Ser 0.65 0.50 - 1.10 mg/dL   Calcium 9.1 8.4 - 10.5 mg/dL   GFR calc non Af Amer 83 (L) >90 mL/min   GFR calc Af Amer >90 >90 mL/min    Comment: (NOTE) The eGFR has been calculated using the CKD EPI equation. This calculation has not been validated in all clinical situations. eGFR's persistently <90 mL/min signify possible Chronic Kidney Disease.    Anion gap 8 5 - 15  Glucose, capillary     Status: Abnormal   Collection Time: 01/26/15  7:55 AM  Result Value Ref Range   Glucose-Capillary 133 (H) 70 - 99 mg/dL    Imaging / Studies: Ir Sinus/fist Tube Chk-non Gi  01/25/2015   CLINICAL DATA:  Abscess strain  EXAM: SINUS TRACT INJECTION/FISTULOGRAM  FLUOROSCOPY TIME:  30 seconds.  MEDICATIONS AND MEDICAL HISTORY: None  ANESTHESIA/SEDATION: None  CONTRAST:  10 cc  Omnipaque 300  PROCEDURE: The procedure, risks, benefits, and alternatives were explained to the patient. Questions regarding the procedure were encouraged and answered. The patient understands and consents to the procedure.  Contrast was injected into the left lower quadrant abscess strain and imaging was obtained.  FINDINGS: The abscess cavity is decompressed. There is no evidence of bowel fistula.  COMPLICATIONS: None  IMPRESSION: Abscess cavity decompressed and there is no evidence of bowel fistula.   Electronically Signed   By: Marybelle Killings M.D.   On: 01/25/2015 16:16    Medications / Allergies:  Scheduled Meds: . ALPRAZolam  0.25 mg Oral BID  . antiseptic oral rinse  7 mL Mouth Rinse q12n4p  . chlorhexidine  15 mL Mouth Rinse BID  . enoxaparin (LOVENOX) injection  40 mg Subcutaneous Q24H  . ertapenem  1 g Intravenous Q24H  . feeding  supplement (RESOURCE BREEZE)  1 Container Oral TID BM  . insulin aspart  0-15 Units Subcutaneous 6 times per day  . losartan  100 mg Oral Daily  . mirabegron ER  50 mg Oral Daily  . pantoprazole  40 mg Oral Daily  . sodium chloride  10-40 mL Intracatheter Q12H  . sodium chloride  3 mL Intravenous Q12H   Continuous Infusions: . Marland KitchenTPN (CLINIMIX-E) Adult 20 mL/hr at 01/25/15 1716   And  . fat emulsion 240 mL (01/25/15 1716)  . lactated ringers 50 mL/hr at 01/25/15 1801   PRN Meds:.acetaminophen **OR** acetaminophen, alum & mag hydroxide-simeth, hyoscyamine, levalbuterol, ondansetron **OR** ondansetron (ZOFRAN) IV, oxyCODONE-acetaminophen, sodium chloride  Antibiotics: Anti-infectives    Start     Dose/Rate Route Frequency Ordered Stop   01/17/15 2100  ertapenem (INVANZ) 1 g in sodium chloride 0.9 % 50 mL IVPB     1 g 100 mL/hr over 30 Minutes Intravenous Every 24 hours 01/17/15 2012     01/16/15 2100  metroNIDAZOLE (FLAGYL) IVPB 500 mg  Status:  Discontinued     500 mg 100 mL/hr over 60 Minutes Intravenous Every 8 hours 01/16/15 2026 01/17/15 1019    01/16/15 0315  piperacillin-tazobactam (ZOSYN) IVPB 3.375 g  Status:  Discontinued     3.375 g 12.5 mL/hr over 240 Minutes Intravenous Every 8 hours 01/16/15 0301 01/17/15 2012   01/15/15 1830  piperacillin-tazobactam (ZOSYN) IVPB 3.375 g     3.375 g 100 mL/hr over 30 Minutes Intravenous  Once 01/15/15 1807 01/15/15 2059       Assessment/Plan Diverticulitis with colovesical fistula POD #5 s/p Diverting loop colostomy (Dr. Hulen Skains) -tolerating soft diet---calorie count through tomorrow.  Taking in <25% PO -IV antibiotics (Invanz Day #10/?) -Ambulate and IS -WOC nursing for colostomy -Feeding supplements, Resource Breeze -IR to DC perc drain -anticipate discharge in the next 1-2 days PCM -continue at 1/2 dose, await calorie count.  Ate about 50% of breakfast today UTI, secondary to colovesical fistula -UA no nitrates, large leukocytes.  Will add culture VTE proph -lovenox, SCD's HTN -on cozaar Diabetes Mellitus -a1c 6.5 -no meds recommended by medicine, outpatient f/u with PCP   Erby Pian, ANP-BC [facility] Pager [phone](7A-4:30P) For consults and floor pages call [phone](7A-4:30P)  01/26/2015 10:34 AM

## 2015-01-26 NOTE — Progress Notes (Signed)
PARENTERAL NUTRITION CONSULT NOTE - FOLLOW UP  Pharmacy Consult for TPN Indication: Diverticulitis with abscess, colovesical fistula   Allergies  Allergen Reactions  . Aspirin Other (See Comments)    Makes me "shaky"  . Ciprofloxacin Hives  . Codeine Other (See Comments)    Headache  . Sulfur Rash    Welps    Patient Measurements: Height: 5\' 3"  (160 cm) Weight: 193 lb 2 oz (87.6 kg) IBW/kg (Calculated) : 52.4 Adjusted Body Weight: 66 kg  Vital Signs: Temp: 98.3 F (36.8 C) (03/01 0508) Temp Source: Oral (03/01 0508) BP: 115/62 mmHg (03/01 0508) Pulse Rate: 82 (03/01 0508) Intake/Output from previous day: 02/29 0701 - 03/01 0700 In: 1771.3 [I.V.:1191.7; IV Piggyback:50; TPN:524.6] Out: 1035 [Urine:950; Drains:10; Stool:75]  Labs:  Recent Labs  01/26/15 0100  WBC 8.5  HGB 8.5*  HCT 26.9*  PLT 232    Recent Labs  01/25/15 0440 01/26/15 0100 01/26/15 0525  NA 134* 134* 135  K 4.3 4.3 4.3  CL 99 97 96  CO2 33* 33* 31  GLUCOSE 126* 126* 112*  BUN 14 13 14   CREATININE 0.63 0.71 0.65  CALCIUM 8.7 9.0 9.1  MG 1.7  --   --   PHOS 4.7*  --   --   PROT 5.8*  --   --   ALBUMIN 1.6*  --   --   AST 23  --   --   ALT 11  --   --   ALKPHOS 98  --   --   BILITOT 0.1*  --   --   PREALBUMIN 8.8*  --   --   TRIG 184*  --   --    Estimated Creatinine Clearance: 61.8 mL/min (by C-G formula based on Cr of 0.65).   Recent Labs  01/26/15 0349 01/26/15 0437 01/26/15 0755  GLUCAP 162* 106* 133*   Insulin Requirements in the past 24 hours:  4 units SSI  Assessment: [name] with a colovesical fistula. She started having issues in January and was treated with several courses of antibiotics for brown urine with associated frequency and urgency. She underwent colonoscopy where fistula was found. CT revealed sigmoid diverticulitis abscess.   GI: Sigmoid diverticulitis with abscess and colovesical fistula. Percutaneous drain placed for abscess. She is now s/p surgery for  a diverting loop colostomy.  She has a daughter in law who will be a caregiver at discharge. Tolerating full liquids but only ate 50% of breakfast. IR to d/c perc drain today   Endo: hx DM (diet-controlled). CBGs 124-152 in last 24h. On SSI   Lytes: Na 135, Phos 4.7, Mg 1.7, K 4.3   Renal: SCr stable 0.65. UOP 0.42mL/kg/hr.  She continues on Mirabegron ER but is now off phenazopyridine.  Maintenance fluids are LR at 46ml/hr  Pulm: 99/RA  Cards: VSS, On losartan   Hepatobil: LFTs wnl   Neuro: A&O   ID: Diverticulitis with abscess, s/p drain placement 2/21. Flagyl and Zosyn changed to Invanz- D#8. Blood cultures neg, urine and abscess cultures negative.  Will need to determine LOT with IV antibiotics  Best Practices: Lovenox, MC, SCDs  TPN Access: PICC placed 2/21 TPN day#: 6 (2/21>)  Nutritional Goals:  1800-2000 kCal, 75-85 grams of protein per day as per RD recommendations 2/22  Current Nutrition:  -Clinimix E 5/20 at 20 ml/hr + 20% lipids at 10 ml/hr -Resource Breeze 1 container TID- each supplement provides 9g protein and 250kCal   Plan:  -Continue Clinimix E  5/20 at 20 mL/hr + IVFE at 10 mL/hr. Possible d/c tomorrow -multivitamin and full trace elements in TPN -Continue SSI and CBG's -f/u labs in AM   Albertina Parr, PharmD., BCPS Clinical Pharmacist Phone [phone]

## 2015-01-27 LAB — BASIC METABOLIC PANEL
Anion gap: 7 (ref 5–15)
BUN: 13 mg/dL (ref 6–23)
CALCIUM: 8.3 mg/dL — AB (ref 8.4–10.5)
CO2: 24 mmol/L (ref 19–32)
CREATININE: 0.64 mg/dL (ref 0.50–1.10)
Chloride: 100 mmol/L (ref 96–112)
GFR calc non Af Amer: 84 mL/min — ABNORMAL LOW (ref >90)
Glucose, Bld: 121 mg/dL — ABNORMAL HIGH (ref 70–99)
Potassium: 4 mmol/L (ref 3.5–5.1)
Sodium: 131 mmol/L — ABNORMAL LOW (ref 135–145)

## 2015-01-27 LAB — GLUCOSE, CAPILLARY
GLUCOSE-CAPILLARY: 110 mg/dL — AB (ref 70–99)
GLUCOSE-CAPILLARY: 148 mg/dL — AB (ref 70–99)
GLUCOSE-CAPILLARY: 119 mg/dL — AB (ref 70–99)
Glucose-Capillary: 113 mg/dL — ABNORMAL HIGH (ref 70–99)
Glucose-Capillary: 112 mg/dL — ABNORMAL HIGH (ref 70–99)
Glucose-Capillary: 143 mg/dL — ABNORMAL HIGH (ref 70–99)

## 2015-01-27 LAB — MAGNESIUM: Magnesium: 1.5 mg/dL (ref 1.5–2.5)

## 2015-01-27 LAB — URINE CULTURE
CULTURE: NO GROWTH
Colony Count: NO GROWTH

## 2015-01-27 LAB — PHOSPHORUS: Phosphorus: 4 mg/dL (ref 2.3–4.6)

## 2015-01-27 MED ORDER — POLYETHYLENE GLYCOL 3350 17 G PO PACK
17.0000 g | PACK | Freq: Every day | ORAL | Status: DC
  Administered 2015-01-27 – 2015-01-31 (×4): 17 g via ORAL
  Filled 2015-01-27 (×6): qty 1

## 2015-01-27 NOTE — Consult Note (Signed)
WOC ostomy follow up Stoma type/location: LLQ, loop colostomy Stomal assessment/size: oval shaped, see note this week, pink, moist Peristomal assessment: using 2" barrier ring to flatten dips in peristomal areas at 3 and 9 o'clock  Output still continues to have only blood drainage.  Ostomy pouching: 1pc. In place from yesterday  Education provided: continued to answer questions regarding diet and care of stoma for patient, she does not have any family in the room at the time of my visit today.  She has requested I contact her daughter Deatra James to arrange a teaching visit with her for tom.   I have contacted Bridgett and we will meet for teaching visit tom at 12noon.   Continue to support need for [facility] for ostomy teaching and support.   Enrolled patient in [place] Start Discharge program: Yes  WOC will follow along with you for ostomy care/education Para March RN,CWOCN [phone]

## 2015-01-27 NOTE — Progress Notes (Signed)
Patient ID: Michele Palmer, female   DOB: 02-23-1936, 79 y.o.   MRN: [id number] 6 Days Post-Op  Subjective: Pt tearful this morning because her food is cold and she woke up anxious this morning.  Otherwise, she is eating better.  Objective: Vital signs in last 24 hours: Temp:  [98.5 F (36.9 C)-99 F (37.2 C)] 98.5 F (36.9 C) (03/02 0531) Pulse Rate:  [80-96] 81 (03/02 0531) Resp:  [18] 18 (03/02 0531) BP: (93-116)/(42-65) 93/42 mmHg (03/02 1025) SpO2:  [96 %-100 %] 96 % (03/02 0531) Weight:  [193 lb 9 oz (87.8 kg)] 193 lb 9 oz (87.8 kg) (03/02 0531) Last BM Date: 01/26/15  Intake/Output from previous day: 03/01 0701 - 03/02 0700 In: 1593.3 [P.O.:480; I.V.:1058.3; IV Piggyback:50] Out: 2000 [Urine:2000] Intake/Output this shift: Total I/O In: 240 [P.O.:240] Out: 700 [Urine:700]  PE: Abd: soft, appropriately tender, incision is c/d/i with staples, ostomy with flatus and sweat.  No stool yet, +BS, old drain site covered.  Lab Results:   Recent Labs  01/26/15 0100  WBC 8.5  HGB 8.5*  HCT 26.9*  PLT 232   BMET  Recent Labs  01/26/15 0525 01/27/15 0545  NA 135 131*  K 4.3 4.0  CL 96 100  CO2 31 24  GLUCOSE 112* 121*  BUN 14 13  CREATININE 0.65 0.64  CALCIUM 9.1 8.3*   PT/INR No results for input(s): LABPROT, INR in the last 72 hours. CMP     Component Value Date/Time   NA 131* 01/27/2015 0545   NA 138 08/25/2014 1341   K 4.0 01/27/2015 0545   K 4.0 08/25/2014 1341   CL 100 01/27/2015 0545   CO2 24 01/27/2015 0545   CO2 28 08/25/2014 1341   GLUCOSE 121* 01/27/2015 0545   GLUCOSE 177* 08/25/2014 1341   BUN 13 01/27/2015 0545   BUN 20.4 08/25/2014 1341   CREATININE 0.64 01/27/2015 0545   CREATININE 0.8 08/25/2014 1341   CALCIUM 8.3* 01/27/2015 0545   CALCIUM 9.7 08/25/2014 1341   PROT 5.8* 01/25/2015 0440   PROT 7.6 08/25/2014 1341   ALBUMIN 1.6* 01/25/2015 0440   ALBUMIN 3.2* 08/25/2014 1341   AST 23 01/25/2015 0440   AST 12 08/25/2014 1341   ALT  11 01/25/2015 0440   ALT 11 08/25/2014 1341   ALKPHOS 98 01/25/2015 0440   ALKPHOS 114 08/25/2014 1341   BILITOT 0.1* 01/25/2015 0440   BILITOT 0.60 08/25/2014 1341   GFRNONAA 84* 01/27/2015 0545   GFRAA >90 01/27/2015 0545   Lipase  No results found for: LIPASE     Studies/Results: Ir Sinus/fist Tube Chk-non Gi  01/25/2015   CLINICAL DATA:  Abscess strain  EXAM: SINUS TRACT INJECTION/FISTULOGRAM  FLUOROSCOPY TIME:  30 seconds.  MEDICATIONS AND MEDICAL HISTORY: None  ANESTHESIA/SEDATION: None  CONTRAST:  10 cc Omnipaque 300  PROCEDURE: The procedure, risks, benefits, and alternatives were explained to the patient. Questions regarding the procedure were encouraged and answered. The patient understands and consents to the procedure.  Contrast was injected into the left lower quadrant abscess strain and imaging was obtained.  FINDINGS: The abscess cavity is decompressed. There is no evidence of bowel fistula.  COMPLICATIONS: None  IMPRESSION: Abscess cavity decompressed and there is no evidence of bowel fistula.   Electronically Signed   By: Marybelle Killings M.D.   On: 01/25/2015 16:16    Anti-infectives: Anti-infectives    Start     Dose/Rate Route Frequency Ordered Stop   01/17/15 2100  ertapenem (  INVANZ) 1 g in sodium chloride 0.9 % 50 mL IVPB  Status:  Discontinued     1 g 100 mL/hr over 30 Minutes Intravenous Every 24 hours 01/17/15 2012 01/27/15 1058   01/16/15 2100  metroNIDAZOLE (FLAGYL) IVPB 500 mg  Status:  Discontinued     500 mg 100 mL/hr over 60 Minutes Intravenous Every 8 hours 01/16/15 2026 01/17/15 1019   01/16/15 0315  piperacillin-tazobactam (ZOSYN) IVPB 3.375 g  Status:  Discontinued     3.375 g 12.5 mL/hr over 240 Minutes Intravenous Every 8 hours 01/16/15 0301 01/17/15 2012   01/15/15 1830  piperacillin-tazobactam (ZOSYN) IVPB 3.375 g     3.375 g 100 mL/hr over 30 Minutes Intravenous  Once 01/15/15 1807 01/15/15 2059       Assessment/Plan   Diverticulitis with  colovesical fistula POD #5 s/p Diverting loop colostomy (Dr. Hulen Skains) -tolerating soft diet---calorie count through tomorrow.intake seems to have improved yesterday.  Cont breeze and DC TNA -IV antibiotics (Invanz Day #11) will DC today -Ambulate and IS -WOC nursing for colostomy -Feeding supplements, Resource Breeze -add miralax.  If no stool tomorrow, will need to digitize her ostomy -anticipate discharge tomorrow PCM -DC TNA today UTI, secondary to colovesical fistula -UA no nitrates, large leukocytes. culture still pending.  She may need more narrowed abx therapy for her urine as opposed to [employer].  Will see what culture shows. VTE proph -lovenox, SCD's HTN -on cozaar Diabetes Mellitus -a1c 6.5 -no meds recommended by medicine, outpatient f/u with PCP  LOS: 12 days    Staceyann Knouff E 01/27/2015, 10:58 AM Pager: [phone]

## 2015-01-27 NOTE — Progress Notes (Signed)
Calorie Count Note  48 hour calorie count ordered and completed.  Diet: Carb Modified Supplements: Resource Breeze po TID, each supplement provides 250 kcal and 9 grams of protein  TPN has been d/c. Intake continues to improve. Per MD notes, possible d/c home tomorrow.  Breakfast: n/a (documented meal intake 75%) Lunch: 100% of chicken and dumplings (385 kcals, 19 grams protein), 50% cabbage (17 kcals, 0.75 grams protein)= 402 kcals and 19 grams protein Dinner: n/a (documented meal intake 75%) Supplements: Resource Breeze po TID, each supplement provides 250 kcal and 9 grams of protein  Total intake: 2152 kcal (120% of minimum estimated needs)  68 protein (91% of minimum estimated needs)   Nutrition Dx: Inadequate oral intake related to altered GI function as evidenced by PO <25%; progressing  Goal: Pt will meet >90% of estimated nutritional needs; met  Intervention: Continue Resource Breeze po TID, each supplement provides 250 kcal and 9 grams of protein   Tanikka Bresnan A. Jimmye Norman, RD, LDN, CDE Pager: [phone] After hours Pager: [phone]

## 2015-01-27 NOTE — Progress Notes (Signed)
Occupational Therapy Treatment Patient Details Name: Michele Palmer MRN: [id number] DOB: Aug 24, 1936 Today's Date: 01/27/2015    History of present illness Abdominal pain. 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  Pt had diverting loop colostomy 01/21/15.   OT comments  Pt progressing. Education provided in session. Continue to recommend [place] upon d/c.  Follow Up Recommendations  Home health OT    Equipment Recommendations   (AE may be helpful)    Recommendations for Other Services      Precautions / Restrictions Precautions Precautions: Fall Precaution Comments: colostomy Restrictions Weight Bearing Restrictions: No       Mobility Bed Mobility Overal bed mobility: Needs Assistance Bed Mobility: Rolling;Sidelying to Sit;Sit to Supine Rolling: Supervision Sidelying to sit: Min guard   Sit to supine: Supervision   General bed mobility comments: assist to scoot HOB and cues for technique. Cues for log roll technique. Pt able to get out of bed without rails.  Transfers Overall transfer level: Needs assistance Equipment used: Rolling walker (2 wheeled) Transfers: Sit to/from Stand Sit to Stand: Min guard         General transfer comment: cues for technique.    Balance  Min guard for ambulation with RW. Pt fearful of falling.                                 ADL Overall ADL's : Needs assistance/impaired     Grooming: Oral care;Wash/dry hands;Standing;Min guard               Lower Body Dressing: Set up;Supervision/safety;Sitting/lateral leans;With adaptive equipment   Toilet Transfer: Min guard;Ambulation;RW;BSC   Toileting- Water quality scientist and Hygiene: Min guard (standing/sitting)       Functional mobility during ADLs: Min guard;Rolling walker General ADL Comments: Educated on benefit of moving and getting out of bed. Educated on AE/cost/where it can be purchased. Pt  practiced with reacher and sockaid.       Vision                     Perception     Praxis      Cognition  Awake/Alert Behavior During Therapy: WFL for tasks assessed/performed Overall Cognitive Status: Impaired/Different from baseline (unsure of baseline) Area of Impairment: Problem solving              Problem Solving: Slow processing;Requires verbal cues      Extremity/Trunk Assessment               Exercises     Shoulder Instructions       General Comments      Pertinent Vitals/ Pain       Pain Assessment: 0-10 Pain Score: 5  Pain Location: abdomen Pain Descriptors / Indicators: Sore Pain Intervention(s): Monitored during session  Home Living                                          Prior Functioning/Environment              Frequency Min 2X/week     Progress Toward Goals  OT Goals(current goals can now be found in the care plan section)  Progress towards OT goals: Progressing toward goals  Acute Rehab OT Goals Patient Stated Goal: not stated  OT Goal Formulation: With patient Time For Goal Achievement: 02/01/15 Potential to Achieve Goals: Good ADL Goals Pt Will Perform Lower Body Bathing: with set-up;with supervision;with adaptive equipment;sit to/from stand Pt Will Perform Lower Body Dressing: with set-up;with supervision;with adaptive equipment;sit to/from stand Pt Will Transfer to Toilet: with supervision;ambulating (comfort height toilet) Pt Will Perform Toileting - Clothing Manipulation and hygiene: with supervision;sit to/from stand Additional ADL Goal #1: Pt will be able get in and OOB for BADLs  Plan Discharge plan remains appropriate    Co-evaluation                 End of Session Equipment Utilized During Treatment: Gait belt;Rolling walker   Activity Tolerance Patient limited by pain   Patient Left in bed;with call bell/phone within reach;with family/visitor present;with SCD's  reapplied   Nurse Communication Mobility status        Time: 1640-1701 OT Time Calculation (min): 21 min  Charges: OT General Charges $OT Visit: 1 Procedure OT Treatments $Self Care/Home Management : 8-22 mins  Benito Mccreedy OTR/L [phone] 01/27/2015, 5:12 PM

## 2015-01-28 LAB — GLUCOSE, CAPILLARY
GLUCOSE-CAPILLARY: 111 mg/dL — AB (ref 70–99)
GLUCOSE-CAPILLARY: 111 mg/dL — AB (ref 70–99)
GLUCOSE-CAPILLARY: 113 mg/dL — AB (ref 70–99)
GLUCOSE-CAPILLARY: 149 mg/dL — AB (ref 70–99)
GLUCOSE-CAPILLARY: 162 mg/dL — AB (ref 70–99)
GLUCOSE-CAPILLARY: 93 mg/dL (ref 70–99)
GLUCOSE-CAPILLARY: 95 mg/dL (ref 70–99)

## 2015-01-28 MED ORDER — OXYCODONE-ACETAMINOPHEN 5-325 MG PO TABS: 1.0000 | ORAL_TABLET | ORAL | Status: DC | PRN

## 2015-01-28 MED ORDER — GLYCERIN (LAXATIVE) 2.1 G RE SUPP
1.0000 | Freq: Once | RECTAL | Status: AC
  Administered 2015-01-28: 1 via RECTAL
  Filled 2015-01-28: qty 1

## 2015-01-28 MED ORDER — POLYETHYLENE GLYCOL 3350 17 G PO PACK: 17.0000 g | PACK | Freq: Every day | ORAL | Status: DC

## 2015-01-28 NOTE — Discharge Summary (Signed)
Patient ID: Michele Palmer MRN: [id number] DOB/AGE: [birth year] 77 y.o.  Admit date: 01/15/2015 Discharge date: 02/05/2015  Procedures: DIVERTING LOOP COLOSTOMY by Dr. Hulen Skains on 01-21-15  Consults: Internal medicine--admitted, transferred to [facility] service on 01/21/15        Urology---Dr. Karsten Ro  Reason for Admission: 79 year old female with a history of recurrent UTI, hypertension, diet-controlled diabetes presents in transfer from [place] where she was found to have an intra-abdominal abscess. The patient has been ill for the last 1 month and have been suffering from recurrent urinary tract infections since November of last year. Over the course of the last month the patient has had suprapubic abdominal pain and fecal material in the urine. She saw urologist Dr. Exie Parody who did a cystoscopy 2 days ago and ordered her to have a CT scan. 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 The patient was transferred to [facility] for further evaluation. She complains of mild nausea but no vomiting. She denies any diarrhea denies any heart. No high-grade fever. She has recently been treated with an antibiotic for 14 days but does not remember the name of this antibiotic.  Admission Diagnoses:  1. Sigmoid diverticulitis with abscess and colovesical fistula 2. HTN 3. DM, diet controlled 4. Hypokalemia 5. Recurrent UTIs--e coli 6. PCM 7. Deconditioning   Hospital Course: The patient was admitted by the medicine service and was started on abx therapy.  General surgery and urology were consulted for her diverticulitis with abscess and colovesical fistula.  Initially, the patient was treated conservatively with a percutaneous drain by interventional radiology as her nutritional status was so poor that proceeding with an operation was felt to be high risk for complications.  Her drain initially drained tan feculent appearing output.  After several  days of abx therapy and consideration for her colovesical fistula and possible fistula to her abscess, the decision was made to take her to the operating room for a diverting loop colostomy with the hopes of this diverting her stool and encouraging healing of her fistulas.   On HD 6, this procedure was completed.  Her initial UA revealed clear infection; however, after this procedure and a full course of Invanz, a repeat UA and culture was negative for continued infection.  Her output to her JP drain decreased and became serous.  Radiology injected her drain on POD 4.  This revealed no evidence of fistula.  Her percutaneous drain was then pulled.  Postoperatively, her diet was able to be advanced as tolerated.  She was on TNA during her stay, but this was able to be weaned with the assistance of [employer].  The patient's appetite improved each day.  On POD 7, the patient's ostomy was digitized as no stool had been seen in her ostomy, just sweat and flatus. She was then given an enema and the rod was removed which resolved this issue and her ostomy remained functional.  On POD#10 the patient developed low grade fevers and an increased white count.  We then made the decision to repeat her CT scan which revealed a reaccumulated 6.2cm fluid collection between the sigmoid colon and the bladder. She then had the perc drain replaced by IR on 02/02/15 which yielded purulent and feculent fluid.  Cultures were also obtained which showed multiple organisms as seen on previous cultures which include GNR, GPC and GPR.  She was restarted on Inanz.  Urine culture was also repeated which grew  e coli. On POD#13 the patient was felt stable for discharge.  I discussed her antibiotics with Dr. Baxter Flattery who recommended Augmentin which will cover the UTI as well as abscess.  We will await final cultures on the abscess and change if needed.  I have called Dr. Simone Curia office to schedule a follow up.  The earliest was 03/08/15, his nurse  will speak with Dr. Karsten Ro and call the patient for a follow up.  She is due to see Dr. Hulen Skains on 3/22.  We discussed warning signs that warrant immediate attention. Medication risks, benefits and therapeutic alternatives were reviewed with the patient.  She verbalizes understanding. She will be going home with 24h assistance and home health nursing.  Her daughter in law has been instructed on drain care/flushing and ostomy change.  I also asked her to follow up with her primary care doctor for diabetes mellitus(a1c 6.5%).  I encouraged her to call with any questions.     Physical Exam: General: Pt awake/alert/oriented x4 in no acute distress Chest: cta. No chest wall pain w good excursion CV: Pulses intact. Regular rhythm Abdomen: Soft. Nondistended. Midline incision is c/d/i,steri strips in place.  ostomy functioning. LLQ drain with purulent, feculent output. No evidence of peritonitis. No incarcerated hernias. Ext: SCDs BLE. No mjr edema. No cyanosis Skin: No petechiae / purpura   Discharge Diagnoses:  Principal Problem:   Intestinal diverticular abscess Active Problems:   Essential hypertension   Hypokalemia   Abdominal abscess   Colonic diverticular abscess   Anxiety state   Depression   Discharge Medications:   Medication List    STOP taking these medications        traMADol 50 MG tablet  Commonly known as:  ULTRAM      TAKE these medications        ALPRAZolam 0.25 MG tablet  Commonly known as:  XANAX  Take 0.25 mg by mouth 2 (two) times daily as needed for anxiety.     amoxicillin-clavulanate 875-125 MG per tablet  Commonly known as:  AUGMENTIN  Take 1 tablet by mouth 2 (two) times daily.     anastrozole 1 MG tablet  Commonly known as:  ARIMIDEX  Take 1 tablet (1 mg total) by mouth daily.     citalopram 20 MG tablet  Commonly known as:  CELEXA  Take 10 mg by mouth every evening.     feeding supplement (PRO-STAT SUGAR FREE 64) Liqd  Take 30 mLs by  mouth 2 (two) times daily.     feeding supplement (RESOURCE BREEZE) Liqd  Take 1 Container by mouth 2 (two) times daily between meals.     losartan 100 MG tablet  Commonly known as:  COZAAR  Take 100 mg by mouth daily.     oxyCODONE-acetaminophen 5-325 MG per tablet  Commonly known as:  PERCOCET/ROXICET  Take 1-2 tablets by mouth every 4 (four) hours as needed for moderate pain.     polyethylene glycol packet  Commonly known as:  MIRALAX / GLYCOLAX  Take 17 g by mouth daily.     ranitidine 150 MG capsule  Commonly known as:  ZANTAC  Take 150 mg by mouth daily as needed.     saccharomyces boulardii 250 MG capsule  Commonly known as:  FLORASTOR  Take 1 capsule (250 mg total) by mouth 2 (two) times daily.        Discharge Instructions: Follow-up Information    Follow up with Claybon Jabs, MD.   Specialty:  Urology   Why:  dr. Simone Curia office will call for a follow up   Contact information:   [place] [address] [phone]       Follow up with Doreen Salvage, MD On 02/16/2015.   Specialty:  General Surgery   Why:  9:50am, arrive no later than 9:20am for paperwork   Contact information:   [address] [place] [address] [phone]       Follow up with VYAS,DHRUV B., MD. Schedule an appointment as soon as possible for a visit in 2 weeks.   Specialty:  Internal Medicine   Contact information:   [place] [phone]       Signed:  Erby Pian, ANP-BC

## 2015-01-28 NOTE — Progress Notes (Signed)
Physical Therapy Treatment Patient Details Name: Michele Palmer MRN: [id number] DOB: [birth year] Today's Date: 01/28/2015    History of Present Illness Abdominal pain. 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  Pt had diverting loop colostomy 01/21/15.    PT Comments    Patient progressing slowly with mobility. Requires encouragement to participate in therapy and improve distance. Pt easily fatigued requiring frequent standing rest breaks. Min guard- Min A for transfers and mobility to maintain balance. Will continue to follow acutely. Will need to assess ability to negotiate steps next session.   Follow Up Recommendations  Home health PT;Supervision for mobility/OOB     Equipment Recommendations  Rolling walker with 5" wheels    Recommendations for Other Services       Precautions / Restrictions Precautions Precautions: Fall Precaution Comments: colostomy Restrictions Weight Bearing Restrictions: No    Mobility  Bed Mobility Overal bed mobility: Needs Assistance Bed Mobility: Rolling;Sidelying to Sit Rolling: Supervision Sidelying to sit: Supervision;HOB elevated       General bed mobility comments: Use of rails. Demonstrated good log roll technique.   Transfers Overall transfer level: Needs assistance Equipment used: Rolling walker (2 wheeled) Transfers: Sit to/from [facility] Sit to Stand: Min guard Stand pivot transfers: Min assist       General transfer comment: SPT bed to [facility], Min A for balance/stability. Min guard to stand from EOB x1.   Ambulation/Gait Ambulation/Gait assistance: Min guard Ambulation Distance (Feet): 200 Feet Assistive device: Rolling walker (2 wheeled) Gait Pattern/deviations: Step-through pattern;Decreased stride length;Shuffle   Gait velocity interpretation: Below normal speed for age/gender General Gait Details: Pt easily fatigued. A few short standing  rest breaks. Reports BLE trembling however not visible. Min guard for safety.    Stairs            Wheelchair Mobility    Modified Rankin (Stroke Patients Only)       Balance Overall balance assessment: Needs assistance Sitting-balance support: Feet supported;No upper extremity supported Sitting balance-Leahy Scale: Good     Standing balance support: During functional activity Standing balance-Leahy Scale: Fair Standing balance comment: Able to perform pericare in standing with Min A for balance/stability.                     Cognition Arousal/Alertness: Awake/alert Behavior During Therapy: WFL for tasks assessed/performed Overall Cognitive Status: Within Functional Limits for tasks assessed                      Exercises      General Comments        Pertinent Vitals/Pain Pain Assessment: Faces Faces Pain Scale: Hurts little more Pain Location: abdomen Pain Descriptors / Indicators: Sore Pain Intervention(s): Monitored during session    Home Living                      Prior Function            PT Goals (current goals can now be found in the care plan section) Progress towards PT goals: Progressing toward goals    Frequency  Min 3X/week    PT Plan Current plan remains appropriate    Co-evaluation             End of Session Equipment Utilized During Treatment: Gait belt Activity Tolerance: Patient limited by fatigue;Patient tolerated treatment well Patient left: in chair;with call bell/phone within reach;with  family/visitor present     Time: 1224-8250 PT Time Calculation (min) (ACUTE ONLY): 27 min  Charges:  $Gait Training: 8-22 mins $Therapeutic Activity: 8-22 mins                    G CodesCandy Sledge A 02/25/15, 4:47 PM Candy Sledge, [place], DPT [phone]

## 2015-01-28 NOTE — Discharge Instructions (Signed)
Take Miralax daily as need for constipation  [facility], [place] [phone]  OPEN ABDOMINAL SURGERY: POST OP INSTRUCTIONS  Always review your discharge instruction sheet given to you by the facility where your surgery was performed.  IF YOU HAVE DISABILITY OR FAMILY LEAVE FORMS, YOU MUST BRING THEM TO THE OFFICE FOR PROCESSING.  PLEASE DO NOT GIVE THEM TO YOUR DOCTOR.  1. A prescription for pain medication may be given to you upon discharge.  Take your pain medication as prescribed, if needed.  If narcotic pain medicine is not needed, then you may take acetaminophen (Tylenol) or ibuprofen (Advil) as needed. 2. Take your usually prescribed medications unless otherwise directed. 3. If you need a refill on your pain medication, please contact your pharmacy. They will contact our office to request authorization.  Prescriptions will not be filled after 5pm or on week-ends. 4. You should follow a light diet the first few days after arrival home, such as soup and crackers, pudding, etc.unless your doctor has advised otherwise. A high-fiber, low fat diet can be resumed as tolerated.   Be sure to include lots of fluids daily. Most patients will experience some swelling and bruising on the chest and neck area.  Ice packs will help.  Swelling and bruising can take several days to resolve 5. Most patients will experience some swelling and bruising in the area of the incision. Ice pack will help. Swelling and bruising can take several days to resolve..  6. It is common to experience some constipation if taking pain medication after surgery.  Increasing fluid intake and taking a stool softener will usually help or prevent this problem from occurring.  A mild laxative (Milk of Magnesia or Miralax) should be taken according to package directions if there are no bowel movements after 48 hours. 7.  You may have steri-strips (small skin tapes) in place directly over the incision.  These strips  should be left on the skin for 7-10 days.  If your surgeon used skin glue on the incision, you may shower in 24 hours.  The glue will flake off over the next 2-3 weeks.  Any sutures or staples will be removed at the office during your follow-up visit. You may find that a light gauze bandage over your incision may keep your staples from being rubbed or pulled. You may shower and replace the bandage daily. 8. ACTIVITIES:  You may resume regular (light) daily activities beginning the next day--such as daily self-care, walking, climbing stairs--gradually increasing activities as tolerated.  You may have sexual intercourse when it is comfortable.  Refrain from any heavy lifting or straining until approved by your doctor. a. You may drive when you no longer are taking prescription pain medication, you can comfortably wear a seatbelt, and you can safely maneuver your car and apply brakes b. Return to Work: ___________________________________ 46. You should see your doctor in the office for a follow-up appointment approximately two weeks after your surgery.  Make sure that you call for this appointment within a day or two after you arrive home to insure a convenient appointment time. OTHER INSTRUCTIONS:  _____________________________________________________________ _____________________________________________________________  WHEN TO CALL YOUR DOCTOR: 1. Fever over 101.0 2. Inability to urinate 3. Nausea and/or vomiting 4. Extreme swelling or bruising 5. Continued bleeding from incision. 6. Increased pain, redness, or drainage from the incision. 7. Difficulty swallowing or breathing 8. Muscle cramping or spasms. 9. Numbness or tingling in hands or feet or around  lips.  The clinic staff is available to answer your questions during regular business hours.  Please dont hesitate to call and ask to speak to one of the nurses if you have concerns.  For further questions, please visit  [email or web address]  Colostomy Home Guide A colostomy is an opening for stool to leave your body when a medical condition prevents it from leaving through the usual opening (rectum). During a surgery, a piece of large intestine (colon) is brought through a hole in the abdominal wall. The new opening is called a stoma or ostomy. A bag or pouch fits over the stoma to catch stool and gas. Your stool may be liquid, somewhat pasty, or formed. CARING FOR YOUR STOMA  Normally, the stoma looks a lot like the inside of your cheek: pink, red, and moist. At first it may be swollen, but this swelling will decrease within 6 weeks. Keep the skin around your stoma clean and dry. You can gently wash your stoma and the skin around your stoma in the shower with a clean, soft washcloth. If you develop any skin irritation, your caregiver may give you a stoma powder or ointment to help heal the area. Do not use any products other than those specifically given to you by your caregiver.  Your stoma should not be uncomfortable. If you notice any stinging or burning, your pouch may be leaking, and the skin around your stoma may be coming into contact with stool. This can cause skin irritation. If you notice stinging, replace your pouch with a new one and discard the old one. OSTOMY POUCHES  The pouch that fits over the ostomy can be made up of either 1 or 2 pieces. A one-piece pouch has a skin barrier piece and the pouch itself in one unit. A two-piece pouch has a skin barrier with a separate pouch that snaps on and off of the skin barrier. Either way, you should empty the pouch when it is only  to  full. Do not let more stool or gas build up. This could cause the pouch to leak. Some ostomy bags have a built-in gas release valve. Ostomy deodorizer (5 drops) can be put into the pouch to prevent odor. Some people use ostomy lubricant drops inside the pouch to help the stool slide out of the bag more easily and  completely.  EMPTYING YOUR OSTOMY POUCH  You may get lessons on how to empty your pouch from a wound-ostomy nurse before you leave the hospital. Here are the basic steps:  Wash your hands with soap and water.  Sit far back on the toilet.  Put several pieces of toilet paper into the toilet water. This will prevent splashing as you empty the stool into the toilet bowl.  Unclip or unvelcro the tail end of the pouch.  Unroll the tail and empty stool into the toilet.  Clean the tail with toilet paper.  Reroll the tail, and clip or velcro it closed.  Wash your hands again. CHANGING YOUR OSTOMY POUCH  Change your ostomy pouch about every 3 to 4 days for the first 6 weeks, then every 5 to7 days. Always change the bag sooner if there is any leakage or you begin to notice any discomfort or irritation of the skin around the stoma. When possible, plan to change your ostomy pouch before eating or drinking as this will lessen the chance of stool coming out during the pouch change. A wound-ostomy nurse may teach you how to change your  pouch before you leave the hospital. Here are the basic steps:  Lay out your supplies.  Wash your hands with soap and water.  Carefully remove the old pouch.  Wash the stoma and allow it to dry. Men may be advised to shave any hair around the stoma very carefully. This will make the adhesive stick better.  Use the stoma measuring guide that comes with your pouch set to decide what size hole you will need to cut in the skin barrier piece. Choose the smallest possible size that will hold the stoma but will not touch it.  Use the guide to trace the circle on the back of the skin barrier piece. Cut out the hole.  Hold the skin barrier piece over the stoma to make sure the hole is the correct size.  Remove the adhesive paper backing from the skin barrier piece.  Squeeze stoma paste around the opening of the skin barrier piece.  Clean and dry the skin around the  stoma again.  Carefully fit the skin barrier piece over your stoma.  If you are using a two-piece pouch, snap the pouch onto the skin barrier piece.  Close the tail of the pouch.  Put your hand over the top of the skin barrier piece to help warm it for about 5 minutes, so that it conforms to your body better.  Wash your hands again. DIET TIPS   Continue to follow your usual diet.  Drink about eight 8 oz glasses of water each day.  You can prevent gas by eating slowly and chewing your food thoroughly.  If you feel concerned that you have too much gas, you can cut back on gas-producing foods, such as:  Spicy foods.  Onions and garlic.  Cruciferous vegetables (cabbage, broccoli, cauliflower, Brussels sprouts).  Beans and legumes.  Some cheeses.  Eggs.  Fish.  Bubbly (carbonated) drinks.  Chewing gum. GENERAL TIPS   You can shower with or without the bag in place.  Always keep the bag on if you are bathing or swimming.  If your bag gets wet, you can dry it with a blow-dryer set to cool.  Avoid wearing tight clothing directly over your stoma so that it does not become irritated or bleed. Tight clothing can also prevent stool from draining into the pouch.  It is helpful to always have an extra skin barrier and pouch with you when traveling. Do not leave them anywhere too warm, as parts of them can melt.  Do not let your seat belt rest on your stoma. Try to keep the seat belt either above or below your stoma, or use a tiny pillow to cushion it.  You can still participate in sports, but you should avoid activities in which there is a risk of getting hit in the abdomen.  You can still have sex. It is a good idea to empty your pouch prior to sex. Some people and their partners feel very comfortable seeing the pouch during sex. Others choose to wear lingerie or a T-shirt that covers the device. SEEK IMMEDIATE MEDICAL CARE IF:  You notice a change in the size or color of  the stoma, especially if it becomes very red, purple, black, or pale white.  You have bloody stools or bleeding from the stoma.  You have abdominal pain, nausea, vomiting, or bloating.  There is anything unusual protruding from the stoma.  You have irritation or red skin around the stoma.  No stool is passing from the  stoma.  You have diarrhea (requiring more frequent than normal pouch emptying). Document Released: 11/16/2003 Document Revised: 02/05/2012 Document Reviewed: 04/12/2011 [facility] Patient Information 2015 [place], [place]. This information is not intended to replace advice given to you by your health care provider. Make sure you discuss any questions you have with your health care provider.

## 2015-01-28 NOTE — Progress Notes (Signed)
Patient ID: Michele Palmer, female   DOB: [birth year], 79 y.o.   MRN: [id number] 7 Days Post-Op  Subjective: Pt feels well today.  Still has not had any stool from her ostomy.  Objective: Vital signs in last 24 hours: Temp:  [98.5 F (36.9 C)-98.7 F (37.1 C)] 98.7 F (37.1 C) (03/03 0511) Pulse Rate:  [86-91] 91 (03/03 0511) Resp:  [16-18] 18 (03/03 0511) BP: (93-124)/(42-70) 124/70 mmHg (03/03 0511) SpO2:  [92 %-97 %] 93 % (03/03 0511) Weight:  [192 lb 14.4 oz (87.5 kg)] 192 lb 14.4 oz (87.5 kg) (03/03 0511) Last BM Date: 01/26/15  Intake/Output from previous day: 03/02 0701 - 03/03 0700 In: 740 [P.O.:720; I.V.:20] Out: 2180 [Urine:2100; Stool:80] Intake/Output this shift:    PE: Abd: soft, minimally tender, incision is c/d/i with staples.  Ostomy with some thick sweat and air, but no stool.  Tried to digitize the ostomy with the pouch still in place (change isn't scheduled til 12 today) and the opening seems fairly tight at the fascia.  Will repeat this later today with bag off.  Lab Results:   Recent Labs  01/26/15 0100  WBC 8.5  HGB 8.5*  HCT 26.9*  PLT 232   BMET  Recent Labs  01/26/15 0525 01/27/15 0545  NA 135 131*  K 4.3 4.0  CL 96 100  CO2 31 24  GLUCOSE 112* 121*  BUN 14 13  CREATININE 0.65 0.64  CALCIUM 9.1 8.3*   PT/INR No results for input(s): LABPROT, INR in the last 72 hours. CMP     Component Value Date/Time   NA 131* 01/27/2015 0545   NA 138 08/25/2014 1341   K 4.0 01/27/2015 0545   K 4.0 08/25/2014 1341   CL 100 01/27/2015 0545   CO2 24 01/27/2015 0545   CO2 28 08/25/2014 1341   GLUCOSE 121* 01/27/2015 0545   GLUCOSE 177* 08/25/2014 1341   BUN 13 01/27/2015 0545   BUN 20.4 08/25/2014 1341   CREATININE 0.64 01/27/2015 0545   CREATININE 0.8 08/25/2014 1341   CALCIUM 8.3* 01/27/2015 0545   CALCIUM 9.7 08/25/2014 1341   PROT 5.8* 01/25/2015 0440   PROT 7.6 08/25/2014 1341   ALBUMIN 1.6* 01/25/2015 0440   ALBUMIN 3.2* 08/25/2014  1341   AST 23 01/25/2015 0440   AST 12 08/25/2014 1341   ALT 11 01/25/2015 0440   ALT 11 08/25/2014 1341   ALKPHOS 98 01/25/2015 0440   ALKPHOS 114 08/25/2014 1341   BILITOT 0.1* 01/25/2015 0440   BILITOT 0.60 08/25/2014 1341   GFRNONAA 84* 01/27/2015 0545   GFRAA >90 01/27/2015 0545   Lipase  No results found for: LIPASE     Studies/Results: No results found.  Anti-infectives: Anti-infectives    Start     Dose/Rate Route Frequency Ordered Stop   01/17/15 2100  ertapenem [facility]) 1 g in sodium chloride 0.9 % 50 mL IVPB  Status:  Discontinued     1 g 100 mL/hr over 30 Minutes Intravenous Every 24 hours 01/17/15 2012 01/27/15 1058   01/16/15 2100  metroNIDAZOLE (FLAGYL) IVPB 500 mg  Status:  Discontinued     500 mg 100 mL/hr over 60 Minutes Intravenous Every 8 hours 01/16/15 2026 01/17/15 1019   01/16/15 0315  piperacillin-tazobactam (ZOSYN) IVPB 3.375 g  Status:  Discontinued     3.375 g 12.5 mL/hr over 240 Minutes Intravenous Every 8 hours 01/16/15 0301 01/17/15 2012   01/15/15 1830  piperacillin-tazobactam (ZOSYN) IVPB 3.375 g  3.375 g 100 mL/hr over 30 Minutes Intravenous  Once 01/15/15 1807 01/15/15 2059       Assessment/Plan    Diverticulitis with colovesical fistula POD #7 s/p Diverting loop colostomy (Dr. Hulen Skains) -tolerating soft diet -meeting caloric needs -Ambulate and IS -WOC nursing for colostomy -Feeding supplements, Resource Breeze -continue miralax.  Will redigitize ostomy when pouch off this afternoon.  Would like patient to prove she can have a BM in ostomy before discharge. PCM -TNA DC yesterday UTI, secondary to colovesical fistula -UA negative, culture negative.  No further treatment VTE proph -lovenox, SCD's HTN -on cozaar Diabetes Mellitus -a1c 6.5 -no meds recommended by medicine, outpatient f/u with PCP  LOS: 13 days    Omid Deardorff E 01/28/2015, 9:25 AM Pager: [phone]

## 2015-01-28 NOTE — Consult Note (Signed)
WOC ostomy follow up Stoma type/location: LLQ, loop stoma with plastic support rod in place Met at bedside with surgery team to digitize the stoma, per PA stool felt.   Glycerin suppository ordered for ascending loop of stoma  Stomal assessment/size: 2" oval, with retraction of the peristomal field at 3 and 9 o'clock  Peristomal assessment: intact, mild denudation at 9 oclock  Treatment options for stomal/peristomal skin: 2" barrier ring cut in 1/2 used to flatted dips in the skin at the support rod Output bloody, mucous Ostomy pouching:2pc.placed today for ease of acces to stoma for suppository administration  Education provided:  Met with daughter Bridgett at the bedside with plans to teach her pouch change, however she was not able to stand at bedside to observe, she had to sit down after seeing stoma.  She may not be able to support patient in home with pouch changes, but can empty pouch.  Daughter in law has performed pouch change with [facility] and lives just next door. Daughter reports nurse friends that can assist also. Would suggest [facility] for support at home. Enrolled patient in [place] Start Discharge program: Yes  WOC will follow up in am to remove support rod per surgery team orders and assist with pouch change at that time.    [place], [place]

## 2015-01-29 LAB — GLUCOSE, CAPILLARY
GLUCOSE-CAPILLARY: 154 mg/dL — AB (ref 70–99)
Glucose-Capillary: 103 mg/dL — ABNORMAL HIGH (ref 70–99)
Glucose-Capillary: 118 mg/dL — ABNORMAL HIGH (ref 70–99)
Glucose-Capillary: 140 mg/dL — ABNORMAL HIGH (ref 70–99)
Glucose-Capillary: 90 mg/dL (ref 70–99)
Glucose-Capillary: 91 mg/dL (ref 70–99)

## 2015-01-29 LAB — CREATININE, SERUM
Creatinine, Ser: 0.8 mg/dL (ref 0.50–1.10)
GFR calc Af Amer: 80 mL/min — ABNORMAL LOW (ref >90)
GFR calc non Af Amer: 69 mL/min — ABNORMAL LOW (ref >90)

## 2015-01-29 MED ORDER — FLEET ENEMA 7-19 GM/118ML RE ENEM
1.0000 | ENEMA | Freq: Once | RECTAL | Status: AC
  Administered 2015-01-29: 1 via RECTAL
  Filled 2015-01-29: qty 1

## 2015-01-29 MED ORDER — OXYCODONE HCL 5 MG PO TABS
5.0000 mg | ORAL_TABLET | ORAL | Status: DC | PRN
  Administered 2015-01-29 – 2015-01-31 (×4): 5 mg via ORAL
  Filled 2015-01-29 (×5): qty 1

## 2015-01-29 MED ORDER — PROMETHAZINE HCL 25 MG/ML IJ SOLN
12.5000 mg | Freq: Four times a day (QID) | INTRAMUSCULAR | Status: DC | PRN
  Administered 2015-01-29: 12.5 mg via INTRAVENOUS
  Filled 2015-01-29: qty 1

## 2015-01-29 NOTE — Progress Notes (Signed)
Pt vomited yellow emesis (approx 300 ml). Pt had IV zofran on board from one hour earlier.

## 2015-01-29 NOTE — Progress Notes (Signed)
OT Cancellation Note  Patient Details Name: Michele Palmer MRN: [id number] DOB: 20-May-1936   Cancelled Treatment:    Reason Eval/Treat Not Completed: Medical issues which prohibited therapy. Pt declined due to nausea. OT will continue to follow up to address OT goals.   Villa Herb M   Cyndie Chime, OTR/L Occupational Therapist [phone] (pager)  01/29/2015, 3:03 PM

## 2015-01-29 NOTE — Progress Notes (Signed)
Pt had success with the enema, now more pain and some nausea likely from pain.  Will keep her overnight.  Plan for DC tomorrow.  She has follow up set up, [place] and equipment.    Michele Palmer, ANP-BC

## 2015-01-29 NOTE — Progress Notes (Signed)
Patient ID: Michele Palmer, female   DOB: 1936-08-15, 79 y.o.   MRN: [id number]     Michele Palmer      [address] [place], [place] [address]    Phone: [phone] FAX: [phone]     Subjective: Had solid stool in ostomy bag, but little results with the suppository bc it came out.  Tolerating PO.  Sore, but overall pain is controlled.   Objective:  Vital signs:  Filed Vitals:   01/28/15 1118 01/28/15 1411 01/28/15 2142 01/29/15 0500  BP: 106/63 114/55 114/67 129/60  Pulse:  91 90 85  Temp:  98.3 F (36.8 C) 98.5 F (36.9 C) 97.9 F (36.6 C)  TempSrc:  Oral Oral Oral  Resp:  18  16  Height:      Weight:    193 lb 3.4 oz (87.64 kg)  SpO2:  93% 94% 92%    Last BM Date: 01/26/15  Intake/Output   Yesterday:  03/03 0701 - 03/04 0700 In: 1700 [P.O.:1080; I.V.:620] Out: 1790 [Urine:1750; Stool:40] This shift:  Total I/O In: -  Out: 350 [Urine:350]   Physical Exam: General: Pt awake/alert/oriented x4 in no acute distress  Abdomen: Soft.  Nondistended. Midline incision is c/d/i, staples in place, ready to be removed.  Stoma is pink and viable, digital exam performed, stool palpated, no resistance was met.   No evidence of peritonitis.  No incarcerated hernias.    Problem List:   Principal Problem:   Intestinal diverticular abscess Active Problems:   Essential hypertension   Hypokalemia   Abdominal abscess   Colonic diverticular abscess   Anxiety state   Depression    Results:   Labs: Results for orders placed or performed during the hospital encounter of 01/15/15 (from the past 48 hour(s))  Glucose, capillary     Status: Abnormal   Collection Time: 01/27/15 12:02 PM  Result Value Ref Range   Glucose-Capillary 148 (H) 70 - 99 mg/dL  Glucose, capillary     Status: Abnormal   Collection Time: 01/27/15  5:18 PM  Result Value Ref Range   Glucose-Capillary 143 (H) 70 - 99 mg/dL  Glucose, capillary     Status:  Abnormal   Collection Time: 01/27/15  8:27 PM  Result Value Ref Range   Glucose-Capillary 119 (H) 70 - 99 mg/dL   Comment 1 Notify RN    Comment 2 Documented in Char   Glucose, capillary     Status: None   Collection Time: 01/28/15 12:29 AM  Result Value Ref Range   Glucose-Capillary 95 70 - 99 mg/dL  Glucose, capillary     Status: None   Collection Time: 01/28/15  4:25 AM  Result Value Ref Range   Glucose-Capillary 93 70 - 99 mg/dL  Glucose, capillary     Status: Abnormal   Collection Time: 01/28/15  8:25 AM  Result Value Ref Range   Glucose-Capillary 111 (H) 70 - 99 mg/dL  Glucose, capillary     Status: Abnormal   Collection Time: 01/28/15 12:13 PM  Result Value Ref Range   Glucose-Capillary 111 (H) 70 - 99 mg/dL  Glucose, capillary     Status: Abnormal   Collection Time: 01/28/15  5:07 PM  Result Value Ref Range   Glucose-Capillary 113 (H) 70 - 99 mg/dL  Glucose, capillary     Status: Abnormal   Collection Time: 01/28/15  8:08 PM  Result Value Ref Range   Glucose-Capillary 149 (H) 70 -  99 mg/dL  Glucose, capillary     Status: None   Collection Time: 01/29/15 12:12 AM  Result Value Ref Range   Glucose-Capillary 90 70 - 99 mg/dL  Glucose, capillary     Status: None   Collection Time: 01/29/15  4:35 AM  Result Value Ref Range   Glucose-Capillary 91 70 - 99 mg/dL  Creatinine, serum     Status: Abnormal   Collection Time: 01/29/15  4:48 AM  Result Value Ref Range   Creatinine, Ser 0.80 0.50 - 1.10 mg/dL   GFR calc non Af Amer 69 (L) >90 mL/min   GFR calc Af Amer 80 (L) >90 mL/min    Comment: (NOTE) The eGFR has been calculated using the CKD EPI equation. This calculation has not been validated in all clinical situations. eGFR's persistently <90 mL/min signify possible Chronic Kidney Disease.   Glucose, capillary     Status: Abnormal   Collection Time: 01/29/15  8:07 AM  Result Value Ref Range   Glucose-Capillary 103 (H) 70 - 99 mg/dL    Imaging / Studies: No  results found.  Medications / Allergies:  Scheduled Meds: . ALPRAZolam  0.25 mg Oral BID  . antiseptic oral rinse  7 mL Mouth Rinse q12n4p  . chlorhexidine  15 mL Mouth Rinse BID  . enoxaparin (LOVENOX) injection  40 mg Subcutaneous Q24H  . feeding supplement (RESOURCE BREEZE)  1 Container Oral TID BM  . insulin aspart  0-15 Units Subcutaneous 6 times per day  . losartan  100 mg Oral Daily  . mirabegron ER  50 mg Oral Daily  . pantoprazole  40 mg Oral Daily  . polyethylene glycol  17 g Oral Daily  . sodium chloride  10-40 mL Intracatheter Q12H  . sodium chloride  3 mL Intravenous Q12H  . sodium phosphate  1 enema Rectal Once   Continuous Infusions: . lactated ringers 50 mL/hr at 01/28/15 1726   PRN Meds:.acetaminophen **OR** acetaminophen, alum & mag hydroxide-simeth, hyoscyamine, levalbuterol, ondansetron **OR** ondansetron (ZOFRAN) IV, oxyCODONE-acetaminophen, sodium chloride  Antibiotics: Anti-infectives    Start     Dose/Rate Route Frequency Ordered Stop   01/17/15 2100  ertapenem (INVANZ) 1 g in sodium chloride 0.9 % 50 mL IVPB  Status:  Discontinued     1 g 100 mL/hr over 30 Minutes Intravenous Every 24 hours 01/17/15 2012 01/27/15 1058   01/16/15 2100  metroNIDAZOLE (FLAGYL) IVPB 500 mg  Status:  Discontinued     500 mg 100 mL/hr over 60 Minutes Intravenous Every 8 hours 01/16/15 2026 01/17/15 1019   01/16/15 0315  piperacillin-tazobactam (ZOSYN) IVPB 3.375 g  Status:  Discontinued     3.375 g 12.5 mL/hr over 240 Minutes Intravenous Every 8 hours 01/16/15 0301 01/17/15 2012   01/15/15 1830  piperacillin-tazobactam (ZOSYN) IVPB 3.375 g     3.375 g 100 mL/hr over 30 Minutes Intravenous  Once 01/15/15 1807 01/15/15 2059        Assessment/Plan Diverticulitis with colovesical fistula POD #8 s/p Diverting loop colostomy (Dr. Hulen Skains) -tolerating soft diet -meeting caloric needs -Ambulate and IS -WOC nursing for colostomy -Feeding supplements, Resource Breeze -continue  miralax.  -rod removed, I was able perform a digital exam without any resistance, stool was noted.  Will give her an enema, WOC to administer and check on her later today. PCM -tolerating PO, off TPN UTI, secondary to colovesical fistula -UA negative, culture negative. No further treatment VTE proph -lovenox, SCD's HTN -on cozaar Diabetes Mellitus -a1c 6.5 -no  meds recommended by medicine, outpatient f/u with PCP  Dispo--anticipate discharge later today if we see results with the enema  Erby Pian, [facility] Pager [phone](7A-4:30P) For consults and floor pages call [phone](7A-4:30P)  01/29/2015 8:33 AM

## 2015-01-29 NOTE — Progress Notes (Signed)
PT Cancellation Note  Patient Details Name: Michele Palmer MRN: [id number] DOB: 01/04/1936   Cancelled Treatment:    Reason Eval/Treat Not Completed: Medical issues which prohibited therapy; nausea and pain.  Will attempt later as time permits.   Trinka Keshishyan,CYNDI 01/29/2015, 11:59 AM

## 2015-01-29 NOTE — Progress Notes (Signed)
Pt has vomited now three times (yellowish emesis) despite zofran and oxy. Will continue to monitor.

## 2015-01-29 NOTE — Consult Note (Signed)
WOC ostomy follow up Met with surgery NP at the bedside today to remove the support rod and allow for digital exam of stoma.  Pt did not have much output after suppository yesterday, but there was a small bit of fecal material in the pouch this am.  Stoma type/location: LLQ, loop colostomy Stomal assessment/size: 1 1/2" x 2" oval, budded, moist and pink, still having some mucosal sloughing with pouch change Peristomal assessment: support rod removed today without difficulty, pt tolerated well.  Treatment options for stomal/peristomal skin: Output brown, liquid effluent in pouch, flatus with pouch change today Ostomy pouching: CCS has ordered a enema for the ascending limb of the loop stoma.  I have placed a irrigation pouch short term for that reason.  I have demonstrated pouch closure and access to the stoma with the bedside nurse to allow for him to administer enema.   Education provided: Explained rationale for enema, answered patient and caregiver questions. Will plan to re-visit patient later in the day to change irrigation pouch to regular pouch for the weekend.  Supplies in the room.  Enrolled patient in [facility] Discharge program: Yes  WOC wound follow up Wound type: surgical; closed incision Wound bed: well approximated, closed incision Drainage (amount, consistency, odor) none Periwound:intact  Dressing procedure/placement/frequency: Staples removed today per surgery VO yesterday. Steristrips placed. Pt tolerated without difficulty.     [facility] team will follow along with you for continued ostomy teaching and support.  [place], [place]

## 2015-01-30 LAB — GLUCOSE, CAPILLARY
GLUCOSE-CAPILLARY: 108 mg/dL — AB (ref 70–99)
GLUCOSE-CAPILLARY: 166 mg/dL — AB (ref 70–99)
Glucose-Capillary: 114 mg/dL — ABNORMAL HIGH (ref 70–99)
Glucose-Capillary: 120 mg/dL — ABNORMAL HIGH (ref 70–99)
Glucose-Capillary: 179 mg/dL — ABNORMAL HIGH (ref 70–99)
Glucose-Capillary: 126 mg/dL — ABNORMAL HIGH (ref 70–99)
Glucose-Capillary: 142 mg/dL — ABNORMAL HIGH (ref 70–99)

## 2015-01-30 NOTE — Progress Notes (Signed)
9 Days Post-Op  Subjective: She had nausea and vomiting last PM after enema.  Apparently the enema worked, bag full and came off last PM and again this AM.  On O2, phenergan made her very sleepy.   Her wound looks fine.    Objective: Vital signs in last 24 hours: Temp:  [98.2 F (36.8 C)-100 F (37.8 C)] 99.3 F (37.4 C) (03/05 0445) Pulse Rate:  [91-105] 105 (03/04 2056) Resp:  [18-20] 20 (03/05 0445) BP: (113-160)/(57-91) 144/78 mmHg (03/05 0445) SpO2:  [88 %-98 %] 98 % (03/05 0445) Weight:  [193 lb 3.8 oz (87.653 kg)] 193 lb 3.8 oz (87.653 kg) (03/05 0445) Last BM Date: 01/30/15 340 PO recorded for yesterday 175 from ostomy Low grade temp last PM. BP up some at that time also No labs Intake/Output from previous day: 03/04 0701 - 03/05 0700 In: 1437.5 [P.O.:340; I.V.:1097.5] Out: 1525 [Urine:1250; Stool:175] Intake/Output this shift:    General appearance: alert, cooperative and no distress Resp: clear to auscultation bilaterally and anterior, I didn't turn her over, with leaking colostomy bag. GI: soft, wound OK leaking stool,   Lab Results:  No results for input(s): WBC, HGB, HCT, PLT in the last 72 hours.  BMET  Recent Labs  01/29/15 0448  CREATININE 0.80   PT/INR No results for input(s): LABPROT, INR in the last 72 hours.   Recent Labs Lab 01/25/15 0440  AST 23  ALT 11  ALKPHOS 98  BILITOT 0.1*  PROT 5.8*  ALBUMIN 1.6*     Lipase  No results found for: LIPASE   Studies/Results: No results found.  Medications: . ALPRAZolam  0.25 mg Oral BID  . antiseptic oral rinse  7 mL Mouth Rinse q12n4p  . chlorhexidine  15 mL Mouth Rinse BID  . enoxaparin (LOVENOX) injection  40 mg Subcutaneous Q24H  . feeding supplement (RESOURCE BREEZE)  1 Container Oral TID BM  . insulin aspart  0-15 Units Subcutaneous 6 times per day  . losartan  100 mg Oral Daily  . mirabegron ER  50 mg Oral Daily  . pantoprazole  40 mg Oral Daily  . polyethylene glycol  17 g Oral  Daily  . sodium chloride  10-40 mL Intracatheter Q12H  . sodium chloride  3 mL Intravenous Q12H   Scheduled Meds: . ALPRAZolam  0.25 mg Oral BID  . antiseptic oral rinse  7 mL Mouth Rinse q12n4p  . chlorhexidine  15 mL Mouth Rinse BID  . enoxaparin (LOVENOX) injection  40 mg Subcutaneous Q24H  . feeding supplement (RESOURCE BREEZE)  1 Container Oral TID BM  . insulin aspart  0-15 Units Subcutaneous 6 times per day  . losartan  100 mg Oral Daily  . mirabegron ER  50 mg Oral Daily  . pantoprazole  40 mg Oral Daily  . polyethylene glycol  17 g Oral Daily  . sodium chloride  10-40 mL Intracatheter Q12H  . sodium chloride  3 mL Intravenous Q12H   Continuous Infusions: . lactated ringers 50 mL/hr at 01/30/15 0751   Prior to Admission medications   Medication Sig Start Date End Date Taking? Authorizing Provider  ALPRAZolam (XANAX) 0.25 MG tablet Take 0.25 mg by mouth 2 (two) times daily as needed for anxiety.    Yes Historical Provider, MD  anastrozole (ARIMIDEX) 1 MG tablet Take 1 tablet (1 mg total) by mouth daily. 02/09/14  Yes Deatra Robinson, MD  citalopram (CELEXA) 20 MG tablet Take 10 mg by mouth every evening.  01/03/15  Yes Historical Provider, MD  losartan (COZAAR) 100 MG tablet Take 100 mg by mouth daily.   Yes Historical Provider, MD  ranitidine (ZANTAC) 150 MG capsule Take 150 mg by mouth daily as needed.    Yes Historical Provider, MD  traMADol (ULTRAM) 50 MG tablet Take 50 mg by mouth 2 (two) times daily.  01/13/15  Yes Historical Provider, MD  oxyCODONE-acetaminophen (PERCOCET/ROXICET) 5-325 MG per tablet Take 1-2 tablets by mouth every 4 (four) hours as needed for moderate pain. 01/28/15   Saverio Danker, PA-C  polyethylene glycol (MIRALAX / GLYCOLAX) packet Take 17 g by mouth daily. 01/28/15   Saverio Danker, PA-C     Assessment/Plan Diverticulitis with colovesical fistula POD #9 s/p Diverting loop colostomy, 01/21/15. (Dr. Hulen Skains) -tolerating soft diet PCM -tolerating PO, off  TPN UTI, secondary to colovesical fistula -UA negative, culture negative. No further treatment VTE proph -lovenox, SCD's HTN -on cozaar Diabetes Mellitus -a1c 6.5 -no meds recommended by medicine, outpatient f/u with PCP PT recommends rolling walker, and home PT Hospitalized since 01/15/15.   Plan:  She was made NPO last evening.  She is on O2, but seems better.  Restart clears and advance to full tonight if OK.  Wean O2.  She will need help with her colostomy.   Recheck labs in AM.  I stopped the phenergan.  LOS: 15 days    Melenda Bielak 01/30/2015

## 2015-01-31 ENCOUNTER — Inpatient Hospital Stay (HOSPITAL_COMMUNITY): Payer: Medicare Other

## 2015-01-31 LAB — COMPREHENSIVE METABOLIC PANEL
ALBUMIN: 1.6 g/dL — AB (ref 3.5–5.2)
ALK PHOS: 100 U/L (ref 39–117)
ALT: 12 U/L (ref 0–35)
AST: 17 U/L (ref 0–37)
Anion gap: 3 — ABNORMAL LOW (ref 5–15)
BUN: 8 mg/dL (ref 6–23)
CALCIUM: 8.4 mg/dL (ref 8.4–10.5)
CO2: 32 mmol/L (ref 19–32)
CREATININE: 0.6 mg/dL (ref 0.50–1.10)
Chloride: 99 mmol/L (ref 96–112)
GFR calc Af Amer: >90 mL/min (ref >90)
GFR calc non Af Amer: 86 mL/min — ABNORMAL LOW (ref >90)
Glucose, Bld: 113 mg/dL — ABNORMAL HIGH (ref 70–99)
Potassium: 3.5 mmol/L (ref 3.5–5.1)
Sodium: 134 mmol/L — ABNORMAL LOW (ref 135–145)
TOTAL PROTEIN: 5.7 g/dL — AB (ref 6.0–8.3)
Total Bilirubin: 0.5 mg/dL (ref 0.3–1.2)

## 2015-01-31 LAB — CBC
HEMATOCRIT: 26.8 % — AB (ref 36.0–46.0)
HEMOGLOBIN: 8.5 g/dL — AB (ref 12.0–15.0)
MCH: 29.1 pg (ref 26.0–34.0)
MCHC: 31.7 g/dL (ref 30.0–36.0)
MCV: 91.8 fL (ref 78.0–100.0)
Platelets: 301 10*3/uL (ref 150–400)
RBC: 2.92 MIL/uL — AB (ref 3.87–5.11)
RDW: 15 % (ref 11.5–15.5)
WBC: 12.6 10*3/uL — ABNORMAL HIGH (ref 4.0–10.5)

## 2015-01-31 LAB — URINALYSIS, ROUTINE W REFLEX MICROSCOPIC
Bilirubin Urine: NEGATIVE
Glucose, UA: NEGATIVE mg/dL
Ketones, ur: NEGATIVE mg/dL
Nitrite: NEGATIVE
PROTEIN: NEGATIVE mg/dL
Specific Gravity, Urine: 1.016 (ref 1.005–1.030)
Urobilinogen, UA: 1 mg/dL (ref 0.0–1.0)
pH: 7.5 (ref 5.0–8.0)

## 2015-01-31 LAB — URINE MICROSCOPIC-ADD ON

## 2015-01-31 LAB — GLUCOSE, CAPILLARY
GLUCOSE-CAPILLARY: 113 mg/dL — AB (ref 70–99)
GLUCOSE-CAPILLARY: 88 mg/dL (ref 70–99)
GLUCOSE-CAPILLARY: 123 mg/dL — AB (ref 70–99)
Glucose-Capillary: 131 mg/dL — ABNORMAL HIGH (ref 70–99)

## 2015-01-31 NOTE — Progress Notes (Signed)
10 Days Post-Op  Subjective: Colostomy leaking again.  She is groggy in day time and she has been getting xanax, which she really only take 1/2 tablet prn at home.  Her wounds looks good.  She tolerated clears, but had some nausea much later with vomiting after apple juice.  She walked but is a little unstable.  She lives at home with elderly husband, with children nearby.    Objective: Vital signs in last 24 hours: Temp:  [97.7 F (36.5 C)-99.9 F (37.7 C)] 99.9 F (37.7 C) (03/06 0529) Pulse Rate:  [89-105] 89 (03/06 0529) Resp:  [12-20] 12 (03/06 0529) BP: (104-164)/(46-90) 104/46 mmHg (03/06 0529) SpO2:  [86 %-96 %] 86 % (03/06 0529) Weight:  [208 lb 14.4 oz (94.756 kg)] 208 lb 14.4 oz (94.756 kg) (03/06 0528) Last BM Date: 01/30/15 770 PO 100 emesis Stool 50 recorded Afebrile, 1 elevated BP but no repeat Labs show the Na is 134, Albumin dow, WBC 12.6  Intake/Output from previous day: 03/05 0701 - 03/06 0700 In: 1370 [P.O.:770; I.V.:600] Out: 1400 [Urine:1250; Emesis/NG output:100; Stool:50] Intake/Output this shift:    General appearance: alert, cooperative and no distress Resp: clear to auscultation bilaterally and few rales in base. GI: soft, colostomy leaking again, + BS, good stool output.  Big issue with seal of the colostomy.  Lab Results:   Recent Labs  01/31/15 0517  WBC 12.6*  HGB 8.5*  HCT 26.8*  PLT 301    BMET  Recent Labs  01/29/15 0448 01/31/15 0517  NA  --  134*  K  --  3.5  CL  --  99  CO2  --  32  GLUCOSE  --  113*  BUN  --  8  CREATININE 0.80 0.60  CALCIUM  --  8.4   PT/INR No results for input(s): LABPROT, INR in the last 72 hours.   Recent Labs Lab 01/25/15 0440 01/31/15 0517  AST 23 17  ALT 11 12  ALKPHOS 98 100  BILITOT 0.1* 0.5  PROT 5.8* 5.7*  ALBUMIN 1.6* 1.6*     Lipase  No results found for: LIPASE   Studies/Results: No results found.  Medications: . ALPRAZolam  0.25 mg Oral BID  . antiseptic oral rinse   7 mL Mouth Rinse q12n4p  . chlorhexidine  15 mL Mouth Rinse BID  . enoxaparin (LOVENOX) injection  40 mg Subcutaneous Q24H  . feeding supplement (RESOURCE BREEZE)  1 Container Oral TID BM  . insulin aspart  0-15 Units Subcutaneous 6 times per day  . losartan  100 mg Oral Daily  . mirabegron ER  50 mg Oral Daily  . pantoprazole  40 mg Oral Daily  . polyethylene glycol  17 g Oral Daily  . sodium chloride  10-40 mL Intracatheter Q12H  . sodium chloride  3 mL Intravenous Q12H    Assessment/Plan Diverticulitis with colovesical fistula POD #10 s/p Diverting loop colostomy, 01/21/15. (Dr. Hulen Skains) -tolerating soft diet PCM -tolerating PO, off TPN UTI, secondary to colovesical fistula -UA negative, culture negative. No further treatment VTE proph -lovenox, SCD's HTN -on cozaar Diabetes Mellitus -a1c 6.5 -no meds recommended by medicine, outpatient f/u with PCP PT recommends rolling walker, and home PT Hospitalized since 01/15/15.  Leukocytosis Recheck UA, last one done on 2/29 was positive for WBC, but negative culture.  I will recheck that and chest film today.   Plan:  I will advance her to a carb modified diet.  She needs home PT, more PT  here.  Stop xanax.  Figure out issue with colostomy bags leaking every day.  Hopefully we can get her off xanax, some additional PT and wound care to figure out what the best solution to her ostomy bags are before she goes home.     LOS: 16 days    Michele Palmer 01/31/2015

## 2015-02-01 ENCOUNTER — Encounter (HOSPITAL_COMMUNITY): Payer: Self-pay | Admitting: Radiology

## 2015-02-01 ENCOUNTER — Inpatient Hospital Stay (HOSPITAL_COMMUNITY): Payer: Medicare Other

## 2015-02-01 LAB — GLUCOSE, CAPILLARY
GLUCOSE-CAPILLARY: 105 mg/dL — AB (ref 70–99)
GLUCOSE-CAPILLARY: 128 mg/dL — AB (ref 70–99)
GLUCOSE-CAPILLARY: 89 mg/dL (ref 70–99)
Glucose-Capillary: 122 mg/dL — ABNORMAL HIGH (ref 70–99)
Glucose-Capillary: 144 mg/dL — ABNORMAL HIGH (ref 70–99)
Glucose-Capillary: 109 mg/dL — ABNORMAL HIGH (ref 70–99)
Glucose-Capillary: 103 mg/dL — ABNORMAL HIGH (ref 70–99)

## 2015-02-01 MED ORDER — ENOXAPARIN SODIUM 60 MG/0.6ML ~~LOC~~ SOLN
50.0000 mg | SUBCUTANEOUS | Status: DC
  Filled 2015-02-01: qty 0.6

## 2015-02-01 MED ORDER — PRO-STAT SUGAR FREE PO LIQD
30.0000 mL | Freq: Two times a day (BID) | ORAL | Status: DC
Start: 2015-02-01 — End: 2015-02-05
  Administered 2015-02-01 – 2015-02-02 (×2): 30 mL via ORAL
  Filled 2015-02-01 (×10): qty 30

## 2015-02-01 MED ORDER — IOHEXOL 300 MG/ML  SOLN
25.0000 mL | INTRAMUSCULAR | Status: AC
  Administered 2015-02-01 (×2): 25 mL via ORAL

## 2015-02-01 MED ORDER — SODIUM CHLORIDE 0.9 % IV SOLN
1.0000 g | INTRAVENOUS | Status: DC
  Administered 2015-02-01 – 2015-02-04 (×4): 1 g via INTRAVENOUS
  Filled 2015-02-01 (×5): qty 1

## 2015-02-01 MED ORDER — BOOST / RESOURCE BREEZE PO LIQD
1.0000 | Freq: Two times a day (BID) | ORAL | Status: DC
  Administered 2015-02-02 – 2015-02-05 (×5): 1 via ORAL

## 2015-02-01 MED ORDER — IOHEXOL 300 MG/ML  SOLN
100.0000 mL | Freq: Once | INTRAMUSCULAR | Status: AC | PRN
  Administered 2015-02-01: 100 mL via INTRAVENOUS

## 2015-02-01 MED ORDER — SODIUM CHLORIDE 0.9 % IV SOLN
INTRAVENOUS | Status: DC
  Administered 2015-02-01 – 2015-02-02 (×2): via INTRAVENOUS
  Administered 2015-02-02: 75 mL/h via INTRAVENOUS
  Administered 2015-02-02 – 2015-02-05 (×5): via INTRAVENOUS

## 2015-02-01 NOTE — Progress Notes (Signed)
NUTRITION FOLLOW UP  Intervention:   -Decrease Resource Breeze po to BID, each supplement provides 250 kcal and 9 grams of protein -30 ml Prostat BID, each supplement provides 100 kcals and 15 grams protein  Nutrition Dx:   Inadequate oral intake related to altered GI function as evidenced by PO: 10-75%; ongoing  Goal:   Pt will meet >90% of estimated nutritional needs; unmet  Monitor:   PO/supplement intake, labs, weight changes, I/O's  Assessment:   79 year old female with a history of recurrent UTI, hypertension, diet-controlled diabetes presents in transfer from [place] where she was found to have an intra-abdominal abscess. The patient has been ill for the last 1 month and have been suffering from recurrent urinary tract infections since November of last year. Over the course of the last month the patient has had suprapubic abdominal pain and fecal material in the urine  S/p Procedure(s) on 01/21/15: DIVERTING LOOP COLOSTOMY  TPN d/c on 01/27/15. Calorie count completed on 01/27/15. Pt reports feeling better. She reports her appetite had been good last week, however, has been poor for the past few days due to receiving an enema. Intake has been variable (PO: 10-75%)She is currently drinking contrast for a CT scan. She reports she likes the [employer] but is having difficulty drinking 3 per day (typically drinks 1-2, noted unopened container at bedside). She is amenable to trying other options, but is wary about trying milk-based supplements. She is willing to try Prostat.  Reviewed importance of good intake of meals and supplements to promote healing.  Labs reviewed. Na: 134, Cl: 113, CBGS: 89-128.   10-75% RS Height: Ht Readings from Last 1 Encounters:  01/15/15 5\' 3"  (1.6 m)    Weight Status:   Wt Readings from Last 1 Encounters:  01/31/15 208 lb 14.4 oz (94.756 kg)   01/18/15 188 lb 0.8 oz (85.3 kg)        Re-estimated needs:  Kcal: 1800-2000 Protein:  75-85 grams Fluid: 1.8-2.0 L  Skin: MASD on buttocks, closed abdominal incision, closed sytem drain to medial abdomen  Diet Order: Diet Carb Modified   Intake/Output Summary (Last 24 hours) at 02/01/15 0923 Last data filed at 02/01/15 0700  Gross per 24 hour  Intake   2520 ml  Output   1710 ml  Net    810 ml    Last BM: 01/21/15   Labs:   Recent Labs Lab 01/26/15 0525 01/27/15 0545 01/29/15 0448 01/31/15 0517  NA 135 131*  --  134*  K 4.3 4.0  --  3.5  CL 96 100  --  99  CO2 31 24  --  32  BUN 14 13  --  8  CREATININE 0.65 0.64 0.80 0.60  CALCIUM 9.1 8.3*  --  8.4  MG  --  1.5  --   --   PHOS  --  4.0  --   --   GLUCOSE 112* 121*  --  113*    CBG (last 3)   Recent Labs  02/01/15 0008 02/01/15 0511 02/01/15 0753  GLUCAP 89 128* 122*    Scheduled Meds: . enoxaparin (LOVENOX) injection  40 mg Subcutaneous Q24H  . feeding supplement (RESOURCE BREEZE)  1 Container Oral TID BM  . insulin aspart  0-15 Units Subcutaneous 6 times per day  . losartan  100 mg Oral Daily  . mirabegron ER  50 mg Oral Daily  . pantoprazole  40 mg Oral Daily  . sodium chloride  10-40 mL Intracatheter Q12H  . sodium chloride  3 mL Intravenous Q12H    Continuous Infusions: . sodium chloride      Shanoah Asbill A. Jimmye Norman, RD, LDN, CDE Pager: [phone] After hours Pager: [phone]

## 2015-02-01 NOTE — Progress Notes (Signed)
Referring Physician(s): Dr. Verita Lamb  Subjective: 79 yo female with ongoing treatment of diverticular abscess with colovesicular fistula. Has previous abscess drainage on 2/20. Subsequent imaging and drain injection showed resolution of abscess, drain was removed on 3/1 Pt was doing doing well until past few days, feeling weaker, then developed low grade fevers and recurrent leukocytosis. Repeat CT today showed recurrence of lower anterior abscess collection. IR requested to place percutaneous drain. Chart, meds, labs, imaging reviewed. Dr. Barbie Banner feels CT guided drainage is appropriate again.  Past Medical History  Diagnosis Date  . Anemia   . Arthritis   . Cancer   . GERD (gastroesophageal reflux disease)   . Anxiety   . Depression   . Breast cancer, right breast 07/04/2013    ILC, 1.8 cm, neg sln receptor+, surgery on 08/01/13   . Diabetes mellitus without complication   . Hypertension    Past Surgical History  Procedure Laterality Date  . Ovary surgery      removed ovaries  . Salpingoophorectomy    . Appendectomy  1966  . Tubal ligation    . Breast lumpectomy with needle localization and axillary sentinel lymph node bx Right 08/01/2013    Procedure: BREAST LUMPECTOMY WITH NEEDLE LOCALIZATION AND AXILLARY SENTINEL LYMPH NODE BIOPSY;  Surgeon: Haywood Lasso, MD;  Location: [facility];  Service: General;  Laterality: Right;  . Colostomy N/A 01/21/2015    Procedure: DIVERTING LOOP COLOSTOMY;  Surgeon: Doreen Salvage, MD;  Location: [facility];  Service: General;  Laterality: N/A;   History   Social History  . Marital Status: Married    Spouse Name: N/A  . Number of Children: N/A  . Years of Education: N/A   Occupational History  . Not on file.   Social History Main Topics  . Smoking status: Never Smoker   . Smokeless tobacco: Never Used  . Alcohol Use: No  . Drug Use: No  . Sexual Activity: Not Currently   Other Topics Concern  . Not on file   Social  History Narrative     Allergies: Aspirin; Ciprofloxacin; Codeine; and Sulfur  Medications:  Current facility-administered medications:  .  0.9 %  sodium chloride infusion, , Intravenous, Continuous, Emina Riebock, NP, Last Rate: 75 mL/hr at 02/01/15 1022 .  acetaminophen (TYLENOL) tablet 650 mg, 650 mg, Oral, Q6H PRN, 650 mg at 02/01/15 0856 **OR** acetaminophen (TYLENOL) suppository 650 mg, 650 mg, Rectal, Q6H PRN, Reyne Dumas, MD .  alum & mag hydroxide-simeth (MAALOX/MYLANTA) 200-200-20 MG/5ML suspension 30 mL, 30 mL, Oral, Q4H PRN, Jonetta Osgood, MD, 30 mL at 01/23/15 1748 .  enoxaparin (LOVENOX) injection 40 mg, 40 mg, Subcutaneous, Q24H, Reyne Dumas, MD, 40 mg at 01/31/15 2216 .  ertapenem (INVANZ) 1 g in sodium chloride 0.9 % 50 mL IVPB, 1 g, Intravenous, Q24H, Emina Riebock, NP .  feeding supplement (PRO-STAT SUGAR FREE 64) liquid 30 mL, 30 mL, Oral, BID, Jenifer A Williams, RD, 30 mL at 02/01/15 1345 .  [START ON 02/02/2015] feeding supplement (RESOURCE BREEZE) (RESOURCE BREEZE) liquid 1 Container, 1 Container, Oral, BID BM, Jenifer A Williams, RD .  hyoscyamine (LEVSIN SL) SL tablet 0.125 mg, 0.125 mg, Sublingual, Q4H PRN, Rana Snare, MD, 0.125 mg at 01/21/15 0331 .  insulin aspart (novoLOG) injection 0-15 Units, 0-15 Units, Subcutaneous, 6 times per day, Lauren D Bajbus, RPH, 2 Units at 02/01/15 1304 .  levalbuterol (XOPENEX) nebulizer solution 0.63 mg, 0.63 mg, Nebulization, Q6H PRN, Reyne Dumas, MD .  losartan (COZAAR) tablet 100 mg, 100 mg, Oral, Daily, Doreen Salvage, MD, 100 mg at 02/01/15 0944 .  mirabegron ER (MYRBETRIQ) tablet 50 mg, 50 mg, Oral, Daily, Kathie Rhodes, MD, 50 mg at 02/01/15 0944 .  ondansetron (ZOFRAN) tablet 4 mg, 4 mg, Oral, Q6H PRN, 4 mg at 01/28/15 0655 **OR** ondansetron (ZOFRAN) injection 4 mg, 4 mg, Intravenous, Q6H PRN, Reyne Dumas, MD, 4 mg at 01/30/15 2130 .  oxyCODONE (Oxy IR/ROXICODONE) immediate release tablet 5-10 mg, 5-10 mg, Oral, Q4H PRN,  Rolm Bookbinder, MD, 5 mg at 01/31/15 2253 .  pantoprazole (PROTONIX) EC tablet 40 mg, 40 mg, Oral, Daily, Ralene Ok, MD, 40 mg at 02/01/15 0945 .  sodium chloride 0.9 % injection 10-40 mL, 10-40 mL, Intracatheter, Q12H, Allie Bossier, MD, 10 mL at 02/01/15 0945 .  sodium chloride 0.9 % injection 10-40 mL, 10-40 mL, Intracatheter, PRN, Allie Bossier, MD, 10 mL at 01/31/15 0511 .  sodium chloride 0.9 % injection 3 mL, 3 mL, Intravenous, Q12H, Reyne Dumas, MD, 3 mL at 02/01/15 0945   Review of Systems  Constitutional: Positive for fever, appetite change and fatigue.  HENT: Negative.   Respiratory: Negative.   Gastrointestinal: Positive for abdominal pain. Negative for nausea, vomiting and blood in stool.  Genitourinary: Negative for dysuria and hematuria.  Psychiatric/Behavioral: Negative for confusion and agitation.    Vital Signs: BP 125/61 mmHg  Pulse 112  Temp(Src) 98.2 F (36.8 C) (Oral)  Resp 18  Ht 5\' 3"  (1.6 m)  Wt 208 lb 14.4 oz (94.756 kg)  BMI 37.01 kg/m2  SpO2 94%  Physical Exam  Constitutional: She is oriented to person, place, and time. She appears well-developed and well-nourished. No distress.  Neck: Normal range of motion. No JVD present. No tracheal deviation present.  Cardiovascular: Normal rate, regular rhythm and normal heart sounds.   Pulmonary/Chest: Effort normal and breath sounds normal. No respiratory distress.  Abdominal: Soft. She exhibits no distension. There is tenderness.  Ostomy noted.  Neurological: She is alert and oriented to person, place, and time.  Psychiatric: Her behavior is normal. Judgment normal.    Imaging: Dg Chest 2 View  01/31/2015   CLINICAL DATA:  Initial evaluation elevated white blood count  EXAM: CHEST  2 VIEW  COMPARISON:  07/29/2013  FINDINGS: Mild cardiac enlargement. Vascular pattern normal. Obliquely oriented bands of linear and platelike opacities extending into both hila. No consolidation or effusion. Moderate  bilateral perihilar bronchial wall thickening. Right PICC line with tip just beyond the cavoatrial junction.  IMPRESSION: Bilateral bronchitic change. Multiple obliquely oriented linear opacities bilaterally. These areas appear most consistent with regions of discoid atelectasis. Focal bacterial pneumonia not identified.   Electronically Signed   By: Skipper Cliche M.D.   On: 01/31/2015 18:16   Ct Abdomen Pelvis W Contrast  02/01/2015   CLINICAL DATA:  79 year old with history of an intra-abdominal abscess. History of colectomy and repair of colovesical fistula. Patient currently has leukocytosis.  EXAM: CT ABDOMEN AND PELVIS WITH CONTRAST  TECHNIQUE: Multidetector CT imaging of the abdomen and pelvis was performed using the standard protocol following bolus administration of intravenous contrast.  CONTRAST:  197mL OMNIPAQUE IOHEXOL 300 MG/ML  SOLN  COMPARISON:  CT 01/16/2015  FINDINGS: Patchy densities at lung bases are most compatible with atelectasis. No evidence for pleural effusions. There are coronary artery calcifications. Negative for free intraperitoneal air.  Normal appearance of the liver, gallbladder and portal venous system. Normal appearance of the pancreas, spleen and adrenal  tissue. Normal appearance of both kidneys without hydronephrosis.  Atherosclerotic calcifications involving the abdominal aorta without aneurysm.  There is an air-fluid collection along the superior aspect of the urinary bladder that measures 6.2 x 4.6 x 4.9 cm. The collection is adjacent to the bladder dome and there is marked wall thickening of the bladder dome. The collection is compatible with an abscess. Small amount a gas in the anterior bladder. This abscess is also directly adjacent to the sigmoid colon. There is diverticulosis throughout the sigmoid colon. Uterus is displaced to the left side due to the abscess. There appears to be a small amount of focal fluid just anterior to the rectum on sequence 2, image 78 which  measures up to 2.2 cm. This small amount of fluid is nonspecific. Limited evaluation of the adnexal tissue.  There is a diverting loop colostomy in the left lower abdomen. The colon is diverted proximal to the pelvic abscess collection. There is a small amount of subcutaneous and intramuscular gas at the colostomy site. There is no significant bowel dilatation. Normal appearance of the small bowel. Small lymph nodes in the abdomen.  No acute bone abnormality. Mild degenerative facet arthropathy in the lower lumbar spine. Multiple levels with vacuum disc phenomena.  IMPRESSION: Air-fluid collection in the pelvis is most compatible with an abscess. This abscess measures up to 6.2 cm and it is situated between the bladder and the sigmoid colon. There is a small amount of gas along the anterior urinary bladder and these findings raise concern for a persistent colovesical fistula. The pelvic abscess collection appears to be amendable to percutaneous drainage from an anterior approach.  2.2 cm low-density collection in the posterior pelvis is nonspecific.  Postsurgical changes compatible with a diverting loop colostomy.   Electronically Signed   By: Markus Daft M.D.   On: 02/01/2015 14:30    Labs:  CBC:  Recent Labs  01/17/15 0604 01/21/15 0600 01/26/15 0100 01/31/15 0517  WBC 7.2 10.1 8.5 12.6*  HGB 9.5* 10.0* 8.5* 8.5*  HCT 30.2* 31.5* 26.9* 26.8*  PLT 334 214 232 301    COAGS:  Recent Labs  01/16/15 0905  INR 1.13    BMP:  Recent Labs  01/26/15 0100 01/26/15 0525 01/27/15 0545 01/29/15 0448 01/31/15 0517  NA 134* 135 131*  --  134*  K 4.3 4.3 4.0  --  3.5  CL 97 96 100  --  99  CO2 33* 31 24  --  32  GLUCOSE 126* 112* 121*  --  113*  BUN 13 14 13   --  8  CALCIUM 9.0 9.1 8.3*  --  8.4  CREATININE 0.71 0.65 0.64 0.80 0.60  GFRNONAA 81* 83* 84* 69* 86*  GFRAA >90 >90 >90 80* >90    LIVER FUNCTION TESTS:  Recent Labs  01/18/15 0513 01/21/15 0600 01/25/15 0440  01/31/15 0517  BILITOT 0.3 0.2* 0.1* 0.5  AST 13 24 23 17   ALT 7 10 11 12   ALKPHOS 71 82 98 100  PROT 5.7* 6.1 5.8* 5.7*  ALBUMIN 1.7* 1.8* 1.6* 1.6*    Assessment and Plan: Recurrent lower anterior abscess in setting of known diverticulitis with Colovesicular fistula Plan for CT guided drainage of abscess collection tomorrow. NPO p MN Hold Lovenox tonight Risks and Benefits discussed with the patient including bleeding, infection, damage to adjacent structures, bowel perforation/fistula connection, and sepsis. All of the patient's questions were answered, patient is agreeable to proceed. Consent signed and in chart.  I spent a total of 20 minutes face to face in clinical consultation/evaluation, greater than 50% of which was counseling/coordinating care for abscess drainage.  SignedAscencion Dike 02/01/2015, 4:28 PM

## 2015-02-01 NOTE — Progress Notes (Signed)
Patient ID: Michele Palmer, female   DOB: 07-02-1936, 79 y.o.   MRN: [id number]     [address] [place], [place] [address]    Phone: [phone] FAX: [phone]     Subjective: States "legs feel like jelly."  Had a tough time going down the stairs with evacuation last night. Temp 100.8 this AM.  WBC up yesterday.  Denies worsening abdominal pain, sob.  Objective:  Vital signs:  Filed Vitals:   01/31/15 1329 01/31/15 1539 01/31/15 2129 02/01/15 0436  BP:  151/69 110/57 128/61  Pulse:  91 84 105  Temp:  100.3 F (37.9 C) 99.6 F (37.6 C) 100.8 F (38.2 C)  TempSrc:  Oral Oral Oral  Resp:  _0 Height:      Weight:      SpO2: 92% 92% 97% 97%    Last BM Date: 01/31/15  Intake/Output   Yesterday:  03/06 0701 - 03/07 0700 In: 2520 [P.O.:720; I.V.:1800] Out: 1710 [Urine:1660; Stool:50] This shift:     Physical Exam: General: Pt awake/alert/oriented x4 in no acute distress Chest: cta.  No chest wall pain w good excursion CV:  Pulses intact.  Regular rhythm Abdomen: Soft. Nondistended. Midline incision is c/d/i,steri strips in place.  ostomy functioning. No evidence of peritonitis. No incarcerated hernias. Ext:  SCDs BLE.  No mjr edema.  No cyanosis Skin: No petechiae / purpura   Problem List:   Principal Problem:   Intestinal diverticular abscess Active Problems:   Essential hypertension   Hypokalemia   Abdominal abscess   Colonic diverticular abscess   Anxiety state   Depression    Results:   Labs: Results for orders placed or performed during the hospital encounter of 01/15/15 (from the past 48 hour(s))  Glucose, capillary     Status: Abnormal   Collection Time: 01/30/15 12:12 PM  Result Value Ref Range   Glucose-Capillary 179 (H) 70 - 99 mg/dL  Glucose, capillary     Status: Abnormal   Collection Time: 01/30/15  4:09 PM  Result Value Ref Range   Glucose-Capillary 126 (H) 70 -  99 mg/dL  Glucose, capillary     Status: Abnormal   Collection Time: 01/30/15  8:07 PM  Result Value Ref Range   Glucose-Capillary 142 (H) 70 - 99 mg/dL  Glucose, capillary     Status: Abnormal   Collection Time: 01/30/15 11:56 PM  Result Value Ref Range   Glucose-Capillary 108 (H) 70 - 99 mg/dL  Glucose, capillary     Status: Abnormal   Collection Time: 01/31/15  4:04 AM  Result Value Ref Range   Glucose-Capillary 113 (H) 70 - 99 mg/dL  CBC     Status: Abnormal   Collection Time: 01/31/15  5:17 AM  Result Value Ref Range   WBC 12.6 (H) 4.0 - 10.5 K/uL   RBC 2.92 (L) 3.87 - 5.11 MIL/uL   Hemoglobin 8.5 (L) 12.0 - 15.0 g/dL   HCT 26.8 (L) 36.0 - 46.0 %   MCV 91.8 78.0 - 100.0 fL   MCH 29.1 26.0 - 34.0 pg   MCHC 31.7 30.0 - 36.0 g/dL   RDW 15.0 11.5 - 15.5 %   Platelets 301 150 - 400 K/uL  Comprehensive metabolic panel     Status: Abnormal   Collection Time: 01/31/15  5:17 AM  Result Value Ref Range   Sodium 134 (L) 135 - 145 mmol/L  Potassium 3.5 3.5 - 5.1 mmol/L   Chloride 99 96 - 112 mmol/L   CO2 32 19 - 32 mmol/L   Glucose, Bld 113 (H) 70 - 99 mg/dL   BUN 8 6 - 23 mg/dL   Creatinine, Ser 0.60 0.50 - 1.10 mg/dL   Calcium 8.4 8.4 - 10.5 mg/dL   Total Protein 5.7 (L) 6.0 - 8.3 g/dL   Albumin 1.6 (L) 3.5 - 5.2 g/dL   AST 17 0 - 37 U/L   ALT 12 0 - 35 U/L   Alkaline Phosphatase 100 39 - 117 U/L   Total Bilirubin 0.5 0.3 - 1.2 mg/dL   GFR calc non Af Amer 86 (L) >90 mL/min   GFR calc Af Amer >90 >90 mL/min    Comment: (NOTE) The eGFR has been calculated using the CKD EPI equation. This calculation has not been validated in all clinical situations. eGFR's persistently <90 mL/min signify possible Chronic Kidney Disease.    Anion gap 3 (L) 5 - 15  Glucose, capillary     Status: None   Collection Time: 01/31/15  8:12 AM  Result Value Ref Range   Glucose-Capillary 88 70 - 99 mg/dL  Glucose, capillary     Status: Abnormal   Collection Time: 01/31/15 12:44 PM  Result  Value Ref Range   Glucose-Capillary 123 (H) 70 - 99 mg/dL  Urinalysis, Routine w reflex microscopic     Status: Abnormal   Collection Time: 01/31/15  1:41 PM  Result Value Ref Range   Color, Urine YELLOW YELLOW   APPearance TURBID (A) CLEAR   Specific Gravity, Urine 1.016 1.005 - 1.030   pH 7.5 5.0 - 8.0   Glucose, UA NEGATIVE NEGATIVE mg/dL   Hgb urine dipstick LARGE (A) NEGATIVE   Bilirubin Urine NEGATIVE NEGATIVE   Ketones, ur NEGATIVE NEGATIVE mg/dL   Protein, ur NEGATIVE NEGATIVE mg/dL   Urobilinogen, UA 1.0 0.0 - 1.0 mg/dL   Nitrite NEGATIVE NEGATIVE   Leukocytes, UA LARGE (A) NEGATIVE  Urine microscopic-add on     Status: Abnormal   Collection Time: 01/31/15  1:41 PM  Result Value Ref Range   Squamous Epithelial / LPF RARE RARE   WBC, UA 21-50 <3 WBC/hpf   RBC / HPF 11-20 <3 RBC/hpf   Bacteria, UA MANY (A) RARE  Glucose, capillary     Status: Abnormal   Collection Time: 01/31/15  5:47 PM  Result Value Ref Range   Glucose-Capillary 131 (H) 70 - 99 mg/dL  Glucose, capillary     Status: None   Collection Time: 02/01/15 12:08 AM  Result Value Ref Range   Glucose-Capillary 89 70 - 99 mg/dL   Comment 1 Notify RN    Comment 2 Documented in Char   Glucose, capillary     Status: Abnormal   Collection Time: 02/01/15  5:11 AM  Result Value Ref Range   Glucose-Capillary 128 (H) 70 - 99 mg/dL  Glucose, capillary     Status: Abnormal   Collection Time: 02/01/15  7:53 AM  Result Value Ref Range   Glucose-Capillary 122 (H) 70 - 99 mg/dL    Imaging / Studies: Dg Chest 2 View  01/31/2015   CLINICAL DATA:  Initial evaluation elevated white blood count  EXAM: CHEST  2 VIEW  COMPARISON:  07/29/2013  FINDINGS: Mild cardiac enlargement. Vascular pattern normal. Obliquely oriented bands of linear and platelike opacities extending into both hila. No consolidation or effusion. Moderate bilateral perihilar bronchial wall thickening. Right PICC line  with tip just beyond the cavoatrial  junction.  IMPRESSION: Bilateral bronchitic change. Multiple obliquely oriented linear opacities bilaterally. These areas appear most consistent with regions of discoid atelectasis. Focal bacterial pneumonia not identified.   Electronically Signed   By: Skipper Cliche M.D.   On: 01/31/2015 18:16    Medications / Allergies:  Scheduled Meds: . enoxaparin (LOVENOX) injection  40 mg Subcutaneous Q24H  . feeding supplement (RESOURCE BREEZE)  1 Container Oral TID BM  . insulin aspart  0-15 Units Subcutaneous 6 times per day  . losartan  100 mg Oral Daily  . mirabegron ER  50 mg Oral Daily  . pantoprazole  40 mg Oral Daily  . polyethylene glycol  17 g Oral Daily  . sodium chloride  10-40 mL Intracatheter Q12H  . sodium chloride  3 mL Intravenous Q12H   Continuous Infusions: . lactated ringers 50 mL/hr at 01/31/15 0308   PRN Meds:.acetaminophen **OR** acetaminophen, alum & mag hydroxide-simeth, hyoscyamine, levalbuterol, ondansetron **OR** ondansetron (ZOFRAN) IV, oxyCODONE, sodium chloride  Antibiotics: Anti-infectives    Start     Dose/Rate Route Frequency Ordered Stop   01/17/15 2100  ertapenem (INVANZ) 1 g in sodium chloride 0.9 % 50 mL IVPB  Status:  Discontinued     1 g 100 mL/hr over 30 Minutes Intravenous Every 24 hours 01/17/15 2012 01/27/15 1058   01/16/15 2100  metroNIDAZOLE (FLAGYL) IVPB 500 mg  Status:  Discontinued     500 mg 100 mL/hr over 60 Minutes Intravenous Every 8 hours 01/16/15 2026 01/17/15 1019   01/16/15 0315  piperacillin-tazobactam (ZOSYN) IVPB 3.375 g  Status:  Discontinued     3.375 g 12.5 mL/hr over 240 Minutes Intravenous Every 8 hours 01/16/15 0301 01/17/15 2012   01/15/15 1830  piperacillin-tazobactam (ZOSYN) IVPB 3.375 g     3.375 g 100 mL/hr over 30 Minutes Intravenous  Once 01/15/15 1807 01/15/15 2059     Assessment/Plan Diverticulitis with colovesical fistula POD #11 s/p Diverting loop colostomy (Dr. Hulen Skains) -tolerating soft diet, Ostomy  functioning, nausea has resolved, meeting caloric needs, pain controlled by PO pain meds -Ambulate and IS -WOC nursing for colostomy -Feeding supplements, Resource Breeze -d/c miralax.  Deconditioning -PT re-eval Fever and leukocytosis -CXR okay, UA okay, culture pending.  Suspect it's from lack of IS use and mobility.  Will d/w Dr. Georgette Dover if a CT of abdomen and pelvis is needed to r/o an abscess  PCM -tolerating PO, off TPN UTI, secondary to colovesical fistula -UA negative, culture negative 2/29. repeat urine culture pending 3/6 VTE proph -lovenox, SCD's HTN -on cozaar Diabetes Mellitus -a1c 6.5 -no meds recommended by medicine, outpatient f/u with PCP   Erby Pian, ANP-BC [facility] Surgery Pager [phone](7A-4:30P) For consults and floor pages call [phone](7A-4:30P)  02/01/2015 8:46 AM

## 2015-02-01 NOTE — Progress Notes (Signed)
Physical Therapy Treatment Patient Details Name: Michele Palmer MRN: [id number] DOB: 02-12-1936 Today's Date: 02/01/2015    History of Present Illness Abdominal pain. 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  Pt had diverting loop colostomy 01/21/15.    PT Comments    Pt admitted with above diagnosis. Pt currently with functional limitations due to balance and endurance deficits. Pt progressing.  Needs encouragement.  Should be fine to go home with HHPT and family wupport.   Pt will benefit from skilled PT to increase their independence and safety with mobility to allow discharge to the venue listed below.    Follow Up Recommendations  Home health PT;Supervision for mobility/OOB     Equipment Recommendations  Rolling walker with 5" wheels    Recommendations for Other Services       Precautions / Restrictions Precautions Precautions: Fall Precaution Comments: colostomy Restrictions Weight Bearing Restrictions: No    Mobility  Bed Mobility Overal bed mobility: Needs Assistance Bed Mobility: Rolling;Sidelying to Sit Rolling: Supervision Sidelying to sit: Min guard       General bed mobility comments: Increased time needed for transitioning LEs into the bed from sitting but she did this without assist.   Transfers Overall transfer level: Needs assistance Equipment used: Rolling walker (2 wheeled) Transfers: Sit to/from Stand Sit to Stand: Min guard         General transfer comment: cues for hand placement. Pt used 3N1 as well.  Needed assist to wipe.  Ambulation/Gait Ambulation/Gait assistance: Min guard Ambulation Distance (Feet): 150 Feet Assistive device: Rolling walker (2 wheeled) Gait Pattern/deviations: Step-through pattern Gait velocity: decreased Gait velocity interpretation: Below normal speed for age/gender General Gait Details: Pt easily fatigued. A few short standing rest breaks. Reports BLE  trembling however not visible. Min guard for safety.    Stairs            Wheelchair Mobility    Modified Rankin (Stroke Patients Only)       Balance Overall balance assessment: Needs assistance         Standing balance support: Bilateral upper extremity supported;During functional activity Standing balance-Leahy Scale: Fair Standing balance comment: can stand statically without UE sufpport.                     Cognition Arousal/Alertness: Awake/alert Behavior During Therapy: Anxious Overall Cognitive Status: Within Functional Limits for tasks assessed Area of Impairment: Problem solving             Problem Solving: Slow processing;Requires verbal cues      Exercises      General Comments        Pertinent Vitals/Pain Pain Assessment: Faces Faces Pain Scale: Hurts little more Pain Location: abdomen Pain Descriptors / Indicators: Aching Pain Intervention(s): Limited activity within patient's tolerance;Monitored during session;Repositioned   VSS Home Living                      Prior Function            PT Goals (current goals can now be found in the care plan section) Progress towards PT goals: Progressing toward goals    Frequency  Min 3X/week    PT Plan Current plan remains appropriate    Co-evaluation             End of Session Equipment Utilized During Treatment: Gait belt Activity Tolerance: Patient limited by fatigue Patient  left: in bed;with call bell/phone within reach;with family/visitor present     Time: 8889-1694 PT Time Calculation (min) (ACUTE ONLY): 26 min  Charges:  $Gait Training: 8-22 mins $Therapeutic Activity: 8-22 mins                    G CodesDenice Paradise 02-06-15, 5:28 PM Deaja Rizo,PT Acute Rehabilitation [phone] (pager)

## 2015-02-01 NOTE — Progress Notes (Signed)
Occupational Therapy Treatment Patient Details Name: Michele Palmer MRN: [id number] DOB: 1936/06/12 Today's Date: 02/01/2015    History of present illness Abdominal pain. 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  Pt had diverting loop colostomy 01/21/15.   OT comments  Pt overall still at a min assist level for functional transfers and toileting as well as selfcare tasks.  Will benefit from continued OT to reach supervision level for home.  Pt still with slight anxiousness that her LEs are going to buckle when she is up, however she was able to tolerate standing at the sink for 6-7 mins without buckling or LOB.   Follow Up Recommendations  Home health OT          Precautions / Restrictions Precautions Precautions: Fall Precaution Comments: colostomy Restrictions Weight Bearing Restrictions: No       Mobility Bed Mobility Overal bed mobility: Needs Assistance   Rolling: Supervision Sidelying to sit: Min guard Supine to sit: Min guard Sit to supine: Min guard   General bed mobility comments: Increased time needed for transitioning LEs into the bed from sitting but she did this without assist.   Transfers Overall transfer level: Needs assistance Equipment used: Rolling walker (2 wheeled) Transfers: Sit to/from Stand Sit to Stand: Min assist;Mod assist (Mod assist needed from lower toilet, min assist from EOB) Stand pivot transfers: Min assist            Balance Overall balance assessment: Needs assistance Sitting-balance support: No upper extremity supported Sitting balance-Leahy Scale: Good       Standing balance-Leahy Scale: Fair                     ADL       Grooming: Wash/dry hands;Wash/dry face;Oral care;Brushing hair;Min guard   Upper Body Bathing: Supervision/ safety;Sitting Upper Body Bathing Details (indicate cue type and reason): pt supervision for washing under her arms              Toilet Transfer: Moderate assistance;Ambulation;Regular Toilet;Grab bars Toilet Transfer Details (indicate cue type and reason): mod assist needed for sit to stand from regular toilet with grab bar Toileting- Clothing Manipulation and Hygiene: Moderate assistance       Functional mobility during ADLs: Minimal assistance;Rolling walker General ADL Comments: Pt slightly anxious that her LEs are going to give out when up but she was willing to participate in session.  She tolerated standing at the sink for 3 grooming tasks with min guard assist for 6-7 mins.  Oxygen sats remained greater than 93% on room air throughout session.  She reports that her son is going to purchase her a tub bench for home and she already has a hand held shower.                 Cognition   Behavior During Therapy: Anxious Overall Cognitive Status: Within Functional Limits for tasks assessed                  General Comments: Pt anxious about walking as her legs are "shaky".    Extremity/Trunk Assessment                          Pertinent Vitals/ Pain       Pain Assessment: Faces Faces Pain Scale: Hurts little more Pain Location: abdomen         Frequency Min 2X/week  Progress Toward Goals  OT Goals(current goals can now be found in the care plan section)  Progress towards OT goals: Progressing toward goals  Acute Rehab OT Goals OT Goal Formulation: With patient/family Time For Goal Achievement: 02/08/15 Potential to Achieve Goals: Good  Plan Discharge plan remains appropriate       End of Session Equipment Utilized During Treatment: Gait belt;Rolling walker   Activity Tolerance Patient tolerated treatment well   Patient Left in bed;with call bell/phone within reach;with family/visitor present;with SCD's reapplied   Nurse Communication Mobility status;Other (comment) (Oxygen sats staying above 90% on room air)        Time: 8416-6063 OT Time Calculation  (min): 33 min  Charges: OT General Charges $OT Visit: 1 Procedure OT Treatments $Self Care/Home Management : 23-37 mins  Abrahm Mancia OTR/L 02/01/2015, 11:32 AM

## 2015-02-01 NOTE — Consult Note (Signed)
WOC ostomy follow up Stoma type/location: LLQ, loop colostomy Stomal assessment/size: pink, moist, functional os at 12 oclock, flush with skin.  Peristomal assessment: some denudation noted assume related to multiple pouch changes over the weekend (3) and leakage of stool. The denudation is noted circumferentially but worse from 1-4 o'clock where she would have leaked.  She has one small ulceration at 4 o'clock that is partial thickeness  Treatment options for stomal/peristomal skin: I will add ostomy powder with next pouch change and teach family use of powder and "crusting" technique, powder not available at the time of the pouch change today. Output liquid brown with some pasty effluent Ostomy pouching: 1pc. Flexible convex used today, 2" barrier ring used to encircle the entire stoma today now that support rod has been removed.  Added ostomy belt to assist with patient security and add a bit of tension on the pouch, her belly is a bit loose around the stoma.   Education provided: demonstrated new pouch change to the patient and the husband today. She does place washcloth over her nose when we change the pouch and reports the smell "gags" her.  Her husband is supportive and at the bedside during the change.  She seems much more engaged in the pouch change today with me. She practiced lock and roll closure several times once pouch in place. She is very appreciative of the belt and she thinks she will like having the belt in place for security.  I have demonstrated how to take the belt on and off for her and her husband. Will follow up in the am to see if this pouch works better for her now that rod has been removed.   Enrolled patient in [place] Start Discharge program: Yes Pt noted to have Miralax ordered and with current volumes of stool requested [facility] to DC this for now. They agreed and have stopped med.  Additionally the pouch was full of flatus when I arrived to the room this and about 1/2  full with stool, I have educated the patient to pay attention to this and let someone know to assist her with emptying and "burping" the gas from the pouch.   WOC will follow along with you for ostomy care and education. Tayra Dawe Straughn RN,CWOCN [phone]

## 2015-02-02 ENCOUNTER — Inpatient Hospital Stay (HOSPITAL_COMMUNITY): Payer: Medicare Other

## 2015-02-02 ENCOUNTER — Encounter (HOSPITAL_COMMUNITY): Payer: Self-pay | Admitting: Radiology

## 2015-02-02 LAB — GLUCOSE, CAPILLARY
GLUCOSE-CAPILLARY: 135 mg/dL — AB (ref 70–99)
Glucose-Capillary: 104 mg/dL — ABNORMAL HIGH (ref 70–99)
Glucose-Capillary: 150 mg/dL — ABNORMAL HIGH (ref 70–99)
Glucose-Capillary: 90 mg/dL (ref 70–99)
Glucose-Capillary: 98 mg/dL (ref 70–99)
Glucose-Capillary: 98 mg/dL (ref 70–99)

## 2015-02-02 LAB — URINE CULTURE: Colony Count: >=100000

## 2015-02-02 LAB — PROTIME-INR
INR: 1.27 (ref 0.00–1.49)
Prothrombin Time: 16 seconds — ABNORMAL HIGH (ref 11.6–15.2)

## 2015-02-02 MED ORDER — FENTANYL CITRATE 0.05 MG/ML IJ SOLN
INTRAMUSCULAR | Status: AC | PRN
  Administered 2015-02-02 (×2): 50 ug via INTRAVENOUS

## 2015-02-02 MED ORDER — ENOXAPARIN SODIUM 40 MG/0.4ML ~~LOC~~ SOLN
40.0000 mg | SUBCUTANEOUS | Status: DC
  Administered 2015-02-02 – 2015-02-04 (×3): 40 mg via SUBCUTANEOUS
  Filled 2015-02-02 (×4): qty 0.4

## 2015-02-02 MED ORDER — LIDOCAINE HCL 1 % IJ SOLN
INTRAMUSCULAR | Status: AC
Start: 2015-02-02 — End: 2015-02-02
  Filled 2015-02-02: qty 20

## 2015-02-02 MED ORDER — MIDAZOLAM HCL 2 MG/2ML IJ SOLN
INTRAMUSCULAR | Status: AC
  Filled 2015-02-02: qty 2

## 2015-02-02 MED ORDER — INSULIN ASPART 100 UNIT/ML ~~LOC~~ SOLN
0.0000 [IU] | Freq: Three times a day (TID) | SUBCUTANEOUS | Status: DC
  Administered 2015-02-02 – 2015-02-04 (×3): 2 [IU] via SUBCUTANEOUS

## 2015-02-02 MED ORDER — MIDAZOLAM HCL 2 MG/2ML IJ SOLN
INTRAMUSCULAR | Status: AC | PRN
  Administered 2015-02-02: 0.5 mg via INTRAVENOUS
  Administered 2015-02-02: 1 mg via INTRAVENOUS

## 2015-02-02 MED ORDER — FENTANYL CITRATE 0.05 MG/ML IJ SOLN
INTRAMUSCULAR | Status: AC
  Filled 2015-02-02: qty 2

## 2015-02-02 NOTE — Progress Notes (Signed)
Patient ID: Michele Palmer, female   DOB: 1936/09/19, 79 y.o.   MRN: [id number]     [facility]      [address] [place], [place] [address]    Phone: [phone] FAX: [phone]     Subjective: Back from IR.  Low grade temps.  Walked with therapies.   Objective:  Vital signs:  Filed Vitals:   02/02/15 0947 02/02/15 0952 02/02/15 0957 02/02/15 1005  BP: 124/53 121/44 108/47 124/57  Pulse: 93 91 89 95  Temp:      TempSrc:      Resp: 19 18 21 20   Height:      Weight:      SpO2: 91% 94% 94% 92%    Last BM Date: 02/02/15  Intake/Output   Yesterday:  03/07 0701 - 03/08 0700 In: 360 [P.O.:360] Out: 1700 [Urine:1600; Stool:100] This shift:  Total I/O In: 1697.5 [P.O.:120; I.V.:1577.5] Out: 105 [Urine:100; Drains:5]   Physical Exam: General: Pt awake/alert/oriented x4 in no acute distress Chest: cta. No chest wall pain w good excursion CV: Pulses intact. Regular rhythm Abdomen: Soft. Nondistended. Midline incision is c/d/i,steri strips in place.  ostomy functioning. LLQ drain with purulent, feculent output.  No evidence of peritonitis. No incarcerated hernias. Ext: SCDs BLE. No mjr edema. No cyanosis Skin: No petechiae / purpura  Problem List:   Principal Problem:   Intestinal diverticular abscess Active Problems:   Essential hypertension   Hypokalemia   Abdominal abscess   Colonic diverticular abscess   Anxiety state   Depression    Results:   Labs: Results for orders placed or performed during the hospital encounter of 01/15/15 (from the past 48 hour(s))  Glucose, capillary     Status: Abnormal   Collection Time: 01/31/15 12:44 PM  Result Value Ref Range   Glucose-Capillary 123 (H) 70 - 99 mg/dL  Urinalysis, Routine w reflex microscopic     Status: Abnormal   Collection Time: 01/31/15  1:41 PM  Result Value Ref Range   Color, Urine YELLOW YELLOW   APPearance TURBID (A) CLEAR   Specific  Gravity, Urine 1.016 1.005 - 1.030   pH 7.5 5.0 - 8.0   Glucose, UA NEGATIVE NEGATIVE mg/dL   Hgb urine dipstick LARGE (A) NEGATIVE   Bilirubin Urine NEGATIVE NEGATIVE   Ketones, ur NEGATIVE NEGATIVE mg/dL   Protein, ur NEGATIVE NEGATIVE mg/dL   Urobilinogen, UA 1.0 0.0 - 1.0 mg/dL   Nitrite NEGATIVE NEGATIVE   Leukocytes, UA LARGE (A) NEGATIVE  Urine microscopic-add on     Status: Abnormal   Collection Time: 01/31/15  1:41 PM  Result Value Ref Range   Squamous Epithelial / LPF RARE RARE   WBC, UA 21-50 <3 WBC/hpf   RBC / HPF 11-20 <3 RBC/hpf   Bacteria, UA MANY (A) RARE  Urine culture     Status: None (Preliminary result)   Collection Time: 01/31/15  1:46 PM  Result Value Ref Range   Specimen Description URINE, CLEAN CATCH    Special Requests NONE    Colony Count      >=100,000 COLONIES/ML Performed at [facility] Performed at [facility]    Report Status PENDING   Glucose, capillary     Status: Abnormal   Collection Time: 01/31/15  5:47 PM  Result Value Ref Range   Glucose-Capillary 131 (H) 70 - 99 mg/dL  Glucose, capillary  Status: Abnormal   Collection Time: 01/31/15  8:06 PM  Result Value Ref Range   Glucose-Capillary 105 (H) 70 - 99 mg/dL   Comment 1 Notify RN    Comment 2 Documented in Char   Glucose, capillary     Status: None   Collection Time: 02/01/15 12:08 AM  Result Value Ref Range   Glucose-Capillary 89 70 - 99 mg/dL   Comment 1 Notify RN    Comment 2 Documented in Char   Glucose, capillary     Status: Abnormal   Collection Time: 02/01/15  5:11 AM  Result Value Ref Range   Glucose-Capillary 128 (H) 70 - 99 mg/dL  Glucose, capillary     Status: Abnormal   Collection Time: 02/01/15  7:53 AM  Result Value Ref Range   Glucose-Capillary 122 (H) 70 - 99 mg/dL  Glucose, capillary     Status: Abnormal   Collection Time: 02/01/15 11:52 AM  Result Value Ref Range   Glucose-Capillary 144 (H) 70 - 99  mg/dL  Glucose, capillary     Status: Abnormal   Collection Time: 02/01/15  5:14 PM  Result Value Ref Range   Glucose-Capillary 109 (H) 70 - 99 mg/dL  Glucose, capillary     Status: Abnormal   Collection Time: 02/01/15  8:04 PM  Result Value Ref Range   Glucose-Capillary 103 (H) 70 - 99 mg/dL  Protime-INR     Status: Abnormal   Collection Time: 02/02/15 12:08 AM  Result Value Ref Range   Prothrombin Time 16.0 (H) 11.6 - 15.2 seconds   INR 1.27 0.00 - 1.49  Glucose, capillary     Status: Abnormal   Collection Time: 02/02/15 12:14 AM  Result Value Ref Range   Glucose-Capillary 104 (H) 70 - 99 mg/dL  Glucose, capillary     Status: None   Collection Time: 02/02/15  4:43 AM  Result Value Ref Range   Glucose-Capillary 90 70 - 99 mg/dL  Glucose, capillary     Status: None   Collection Time: 02/02/15  8:03 AM  Result Value Ref Range   Glucose-Capillary 98 70 - 99 mg/dL    Imaging / Studies: Dg Chest 2 View  01/31/2015   CLINICAL DATA:  Initial evaluation elevated white blood count  EXAM: CHEST  2 VIEW  COMPARISON:  07/29/2013  FINDINGS: Mild cardiac enlargement. Vascular pattern normal. Obliquely oriented bands of linear and platelike opacities extending into both hila. No consolidation or effusion. Moderate bilateral perihilar bronchial wall thickening. Right PICC line with tip just beyond the cavoatrial junction.  IMPRESSION: Bilateral bronchitic change. Multiple obliquely oriented linear opacities bilaterally. These areas appear most consistent with regions of discoid atelectasis. Focal bacterial pneumonia not identified.   Electronically Signed   By: Skipper Cliche M.D.   On: 01/31/2015 18:16   Ct Abdomen Pelvis W Contrast  02/01/2015   CLINICAL DATA:  79 year old with history of an intra-abdominal abscess. History of colectomy and repair of colovesical fistula. Patient currently has leukocytosis.  EXAM: CT ABDOMEN AND PELVIS WITH CONTRAST  TECHNIQUE: Multidetector CT imaging of the  abdomen and pelvis was performed using the standard protocol following bolus administration of intravenous contrast.  CONTRAST:  160mL OMNIPAQUE IOHEXOL 300 MG/ML  SOLN  COMPARISON:  CT 01/16/2015  FINDINGS: Patchy densities at lung bases are most compatible with atelectasis. No evidence for pleural effusions. There are coronary artery calcifications. Negative for free intraperitoneal air.  Normal appearance of the liver, gallbladder and portal venous system. Normal appearance of  the pancreas, spleen and adrenal tissue. Normal appearance of both kidneys without hydronephrosis.  Atherosclerotic calcifications involving the abdominal aorta without aneurysm.  There is an air-fluid collection along the superior aspect of the urinary bladder that measures 6.2 x 4.6 x 4.9 cm. The collection is adjacent to the bladder dome and there is marked wall thickening of the bladder dome. The collection is compatible with an abscess. Small amount a gas in the anterior bladder. This abscess is also directly adjacent to the sigmoid colon. There is diverticulosis throughout the sigmoid colon. Uterus is displaced to the left side due to the abscess. There appears to be a small amount of focal fluid just anterior to the rectum on sequence 2, image 78 which measures up to 2.2 cm. This small amount of fluid is nonspecific. Limited evaluation of the adnexal tissue.  There is a diverting loop colostomy in the left lower abdomen. The colon is diverted proximal to the pelvic abscess collection. There is a small amount of subcutaneous and intramuscular gas at the colostomy site. There is no significant bowel dilatation. Normal appearance of the small bowel. Small lymph nodes in the abdomen.  No acute bone abnormality. Mild degenerative facet arthropathy in the lower lumbar spine. Multiple levels with vacuum disc phenomena.  IMPRESSION: Air-fluid collection in the pelvis is most compatible with an abscess. This abscess measures up to 6.2 cm and  it is situated between the bladder and the sigmoid colon. There is a small amount of gas along the anterior urinary bladder and these findings raise concern for a persistent colovesical fistula. The pelvic abscess collection appears to be amendable to percutaneous drainage from an anterior approach.  2.2 cm low-density collection in the posterior pelvis is nonspecific.  Postsurgical changes compatible with a diverting loop colostomy.   Electronically Signed   By: Markus Daft M.D.   On: 02/01/2015 14:30    Medications / Allergies:  Scheduled Meds: . enoxaparin (LOVENOX) injection  50 mg Subcutaneous Q24H  . ertapenem  1 g Intravenous Q24H  . feeding supplement (PRO-STAT SUGAR FREE 64)  30 mL Oral BID  . feeding supplement (RESOURCE BREEZE)  1 Container Oral BID BM  . fentaNYL      . insulin aspart  0-15 Units Subcutaneous 6 times per day  . lidocaine      . losartan  100 mg Oral Daily  . midazolam      . mirabegron ER  50 mg Oral Daily  . pantoprazole  40 mg Oral Daily  . sodium chloride  10-40 mL Intracatheter Q12H  . sodium chloride  3 mL Intravenous Q12H   Continuous Infusions: . sodium chloride 75 mL/hr (02/02/15 0016)   PRN Meds:.acetaminophen **OR** acetaminophen, alum & mag hydroxide-simeth, hyoscyamine, levalbuterol, ondansetron **OR** ondansetron (ZOFRAN) IV, oxyCODONE, sodium chloride  Antibiotics: Anti-infectives    Start     Dose/Rate Route Frequency Ordered Stop   02/01/15 1700  ertapenem (INVANZ) 1 g in sodium chloride 0.9 % 50 mL IVPB     1 g 100 mL/hr over 30 Minutes Intravenous Every 24 hours 02/01/15 1619     01/17/15 2100  ertapenem (INVANZ) 1 g in sodium chloride 0.9 % 50 mL IVPB  Status:  Discontinued     1 g 100 mL/hr over 30 Minutes Intravenous Every 24 hours 01/17/15 2012 01/27/15 1058   01/16/15 2100  metroNIDAZOLE (FLAGYL) IVPB 500 mg  Status:  Discontinued     500 mg 100 mL/hr over 60 Minutes Intravenous Every  8 hours 01/16/15 2026 01/17/15 1019   01/16/15  0315  piperacillin-tazobactam (ZOSYN) IVPB 3.375 g  Status:  Discontinued     3.375 g 12.5 mL/hr over 240 Minutes Intravenous Every 8 hours 01/16/15 0301 01/17/15 2012   01/15/15 1830  piperacillin-tazobactam (ZOSYN) IVPB 3.375 g     3.375 g 100 mL/hr over 30 Minutes Intravenous  Once 01/15/15 1807 01/15/15 2059      Assessment/Plan Diverticulitis with colovesical fistula POD #12 s/p Diverting loop colostomy (Dr. Hulen Skains) -tolerating soft diet, Ostomy functioning, nausea has resolved, meeting caloric needs, pain controlled by PO pain meds -Ambulate and IS -WOC nursing for colostomy -Feeding supplements, Resource Breeze Deconditioning -PT re-eval Abscess between sigmoid colon and bladder -IR drain today, feculent output -follow culture -Invanz resumed 3/7 -repeat labs in AM -hopefully we can DC with drain, we will be slow to remove.  If she continues to have an abscess, then she may need a resection.  Dr. Karsten Ro has seen the patient this admission.  PCM -tolerating PO, off TPN UTI, secondary to colovesical fistula -urine culture 3/6 e coli VTE proph -lovenox, SCD's HTN -on cozaar Diabetes Mellitus -a1c 6.5 -no meds recommended by medicine, outpatient f/u with PCP  Erby Pian, ANP-BC [facility] Pager [phone](7A-4:30P) For consults and floor pages call [phone](7A-4:30P)  02/02/2015 10:44 AM

## 2015-02-02 NOTE — Sedation Documentation (Signed)
Patient is resting comfortably. 

## 2015-02-02 NOTE — Progress Notes (Addendum)
Occupational Therapy Treatment Patient Details Name: Michele Palmer MRN: [id number] DOB: 1936/03/24 Today's Date: 02/02/2015    History of present illness Abdominal pain. 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  Pt had diverting loop colostomy 01/21/15.   OT comments  Pt moving well. Education provided in session. Family to let OT know if they want tub/bench ordered or if family to get pt one.  Follow Up Recommendations  Home health OT    Equipment Recommendations  Other (comment) (family to let OT know about tub/bench)    Recommendations for Other Services      Precautions / Restrictions Precautions Precautions: Fall Precaution Comments: colostomy Restrictions Weight Bearing Restrictions: No       Mobility Bed Mobility Overal bed mobility: Needs Assistance Bed Mobility: Supine to Sit;Sit to Supine   Supine to sit: Supervision Sit to supine: Supervision   General bed mobility comments: [place] position used to scoot HOB and cues for technique. Suggested rolling to get out of bed as it may help with pain.  Transfers Overall transfer level: Needs assistance Equipment used: Rolling walker (2 wheeled) Transfers: Sit to/from Stand Sit to Stand: Min guard;Min assist         General transfer comment: cues for hand placement.    Balance Min guard for ambulation with RW.                    ADL Overall ADL's : Needs assistance/impaired     Grooming: Oral care;Wash/dry face;Wash/dry hands;Applying deodorant;Brushing hair;Set up;Supervision/safety (applied lotion)           Upper Body Dressing : Set up;Sitting (OT had to assist with gown due to IV )   Lower Body Dressing: Supervision/safety;Sitting/lateral leans (doffed socks)   Toilet Transfer: Min guard;Ambulation;BSC;RW   Toileting- Clothing Manipulation and Hygiene: Supervision/safety;Set up;Sit to/from stand (washed peri area after  toileting)       Functional mobility during ADLs: Min guard;Rolling walker General ADL Comments: Pt performed grooming at sink. Pt ambulated in hallway. She was able to doff socks today and did not use AE. Educated on tub transfer technique using tub/bench and showed daughter picture of tub/bench and explained cost. Pt's family may buy her one on their own-suppose to let therapist know. Educated on safety such as sitting for most of LB dressing, use of bag on walker, safe shoewear, rugs/items on floor. Talked with daughter about her continuing to encourage pt to do things. Pt concerned about getting legs in tub and OT suggested using sheet to help assist LE's. Explained that moving can help pain.      Vision                     Perception     Praxis      Cognition  Awake/Alert Behavior During Therapy: WFL for tasks assessed/performed Overall Cognitive Status: Within Functional Limits for tasks assessed                       Extremity/Trunk Assessment               Exercises  Other Exercises Other Exercises: performed ankle pumps and OT explained benefit    Shoulder Instructions       General Comments      Pertinent Vitals/ Pain       Pain Assessment: 0-10 Pain Score: 3  Pain Location: bilateral legs Pain Descriptors /  Indicators: Sore Pain Intervention(s): Monitored during session;Repositioned  Home Living                                          Prior Functioning/Environment              Frequency Min 2X/week     Progress Toward Goals  OT Goals(current goals can now be found in the care plan section)  Progress towards OT goals: Progressing toward goals  Acute Rehab OT Goals Patient Stated Goal: not stated OT Goal Formulation: With patient/family Time For Goal Achievement: 02/08/15 Potential to Achieve Goals: Good ADL Goals Pt Will Perform Lower Body Bathing: with set-up;with supervision;with adaptive  equipment Pt Will Perform Lower Body Dressing: with set-up;with supervision;with adaptive equipment Pt Will Transfer to Toilet: with supervision;bedside commode;ambulating Pt Will Perform Toileting - Clothing Manipulation and hygiene: sit to/from stand;with supervision Additional ADL Goal #1: Pt will stand with close supervision for greater than 8 mins during grooming tasks.   Plan Discharge plan remains appropriate    Co-evaluation                 End of Session Equipment Utilized During Treatment: Gait belt;Rolling walker   Activity Tolerance Patient limited by pain;Patient limited by fatigue   Patient Left in bed;with call bell/phone within reach;with family/visitor present   Nurse Communication Other (comment) (told tech to ask nurse for pain meds)        Time: 6553-7482 OT Time Calculation (min): 30 min  Charges: OT General Charges $OT Visit: 1 Procedure OT Treatments $Self Care/Home Management : 8-22 mins $Therapeutic Activity: 8-22 mins  Benito Mccreedy OTR/L [phone] 02/02/2015, 5:39 PM

## 2015-02-02 NOTE — Sedation Documentation (Signed)
Pt wide awake still having back pain and anxiety.

## 2015-02-02 NOTE — Sedation Documentation (Signed)
MD at bedside. Dr. Kathlene Cote at bedside s/w pt, ordered temporary urinary foley for procedure. Pt agreeable.

## 2015-02-02 NOTE — Progress Notes (Signed)
Physical Therapy Treatment Patient Details Name: Michele Palmer MRN: [id number] DOB: 1936-07-27 Today's Date: 02/02/2015    History of Present Illness Abdominal pain. 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  Pt had diverting loop colostomy 01/21/15.    PT Comments    Progressing well.  Easily fatigues with moderate activity.  For return to address fistula in about 6 weeks.   Follow Up Recommendations  Home health PT;Supervision for mobility/OOB     Equipment Recommendations  Rolling walker with 5" wheels    Recommendations for Other Services       Precautions / Restrictions Precautions Precautions: Fall Precaution Comments: colostomy    Mobility  Bed Mobility Overal bed mobility: Needs Assistance Bed Mobility: Sidelying to Sit;Sit to Supine   Sidelying to sit: Min guard   Sit to supine: Min guard   General bed mobility comments: Increased time needed for transitioning LEs into the bed from sitting but she did this without assist.   Transfers Overall transfer level: Needs assistance Equipment used: Rolling walker (2 wheeled) Transfers: Sit to/from Stand Sit to Stand: Min assist         General transfer comment: cues for hand placement  Ambulation/Gait Ambulation/Gait assistance: Min assist Ambulation Distance (Feet): 120 Feet Assistive device: Rolling walker (2 wheeled) Gait Pattern/deviations: Step-through pattern Gait velocity: decreased   General Gait Details: mildly unsteady with RW   Stairs            Wheelchair Mobility    Modified Rankin (Stroke Patients Only)       Balance Overall balance assessment: Needs assistance Sitting-balance support: No upper extremity supported Sitting balance-Leahy Scale: Good     Standing balance support: No upper extremity supported Standing balance-Leahy Scale: Fair                      Cognition Arousal/Alertness:  Awake/alert Behavior During Therapy: WFL for tasks assessed/performed Overall Cognitive Status: Within Functional Limits for tasks assessed                      Exercises General Exercises - Lower Extremity Ankle Circles/Pumps: PROM;10 reps;Supine Quad Sets: AROM;Both;10 reps;Supine Gluteal Sets: AROM;Both;10 reps;Supine Heel Slides: AROM;Both;10 reps;Supine (graded resistance) Straight Leg Raises: AROM;AAROM;Both;10 reps;Supine Other Exercises Other Exercises: bicep/tricep presses x10 reps resisted.    General Comments        Pertinent Vitals/Pain Pain Assessment: Faces Faces Pain Scale: Hurts little more Pain Location: abdomen and legs Pain Descriptors / Indicators: Aching Pain Intervention(s): Limited activity within patient's tolerance    Home Living                      Prior Function            PT Goals (current goals can now be found in the care plan section) Acute Rehab PT Goals Patient Stated Goal: not stated PT Goal Formulation: With patient/family Time For Goal Achievement: 02/08/15 Potential to Achieve Goals: Good Progress towards PT goals: Progressing toward goals    Frequency  Min 3X/week    PT Plan Current plan remains appropriate    Co-evaluation             End of Session   Activity Tolerance: Patient tolerated treatment well Patient left: in bed;with call bell/phone within reach;with family/visitor present     Time: 1333-1400 PT Time Calculation (min) (ACUTE ONLY): 27 min  Charges:  $  Gait Training: 8-22 mins $Therapeutic Exercise: 23-37 mins                    G Codes:      Shaddai Shapley, Tessie Fass 02/02/2015, 2:17 PM 02/02/2015  Donnella Sham, PT [phone]  (pager)

## 2015-02-02 NOTE — Procedures (Signed)
Procedure:  CT guided drainage of pelvic diverticular abscess Findings:  Bladder drained by Foley cath prior to procedure. Aspiration yielded purulent/feculent liquid.  Sample sent for culture. 12 Fr pigtail drain placed and attached to suction bulb. Will follow output. Foley removed after procedure.

## 2015-02-02 NOTE — Sedation Documentation (Signed)
Dr. Kathlene Cote, sutured line in place. No c/o pain from pt, resting with eyes closed.

## 2015-02-02 NOTE — Consult Note (Signed)
WOC by for ostomy assessment and follow up on new pouching applied yesterday.  Per daughter in law this new pouch is working better and belt is making patient feel more secure.  Extra supplies in the patients room. Pt in IR for drain placement.  WOC will follow up in the am.  WOC will follow along with you for support with ostomy care and teaching. Juddson Cobern [place] RN,CWOCN [phone]

## 2015-02-02 NOTE — Sedation Documentation (Signed)
Patient denies pain and is resting comfortably.  

## 2015-02-02 NOTE — Progress Notes (Signed)
OT Cancellation Note  Patient Details Name: Michele Palmer MRN: [id number] DOB: July 11, 1936   Cancelled Treatment:    Reason Eval/Treat Not Completed: Patient at procedure or test/ unavailable  Benito Mccreedy OTR/L [phone] 02/02/2015, 10:08 AM

## 2015-02-03 LAB — BASIC METABOLIC PANEL
ANION GAP: 5 (ref 5–15)
BUN: 8 mg/dL (ref 6–23)
CALCIUM: 8.3 mg/dL — AB (ref 8.4–10.5)
CO2: 28 mmol/L (ref 19–32)
Chloride: 105 mmol/L (ref 96–112)
Creatinine, Ser: 0.55 mg/dL (ref 0.50–1.10)
GFR, EST NON AFRICAN AMERICAN: 88 mL/min — AB (ref >90)
Glucose, Bld: 114 mg/dL — ABNORMAL HIGH (ref 70–99)
POTASSIUM: 2.8 mmol/L — AB (ref 3.5–5.1)
SODIUM: 138 mmol/L (ref 135–145)

## 2015-02-03 LAB — GLUCOSE, CAPILLARY
GLUCOSE-CAPILLARY: 105 mg/dL — AB (ref 70–99)
Glucose-Capillary: 101 mg/dL — ABNORMAL HIGH (ref 70–99)
Glucose-Capillary: 141 mg/dL — ABNORMAL HIGH (ref 70–99)
Glucose-Capillary: 115 mg/dL — ABNORMAL HIGH (ref 70–99)

## 2015-02-03 LAB — CBC
HEMATOCRIT: 25.7 % — AB (ref 36.0–46.0)
HEMOGLOBIN: 8.1 g/dL — AB (ref 12.0–15.0)
MCH: 28.9 pg (ref 26.0–34.0)
MCHC: 31.5 g/dL (ref 30.0–36.0)
MCV: 91.8 fL (ref 78.0–100.0)
Platelets: 297 10*3/uL (ref 150–400)
RBC: 2.8 MIL/uL — AB (ref 3.87–5.11)
RDW: 14.6 % (ref 11.5–15.5)
WBC: 7 10*3/uL (ref 4.0–10.5)

## 2015-02-03 MED ORDER — POTASSIUM CHLORIDE CRYS ER 20 MEQ PO TBCR
40.0000 meq | EXTENDED_RELEASE_TABLET | Freq: Three times a day (TID) | ORAL | Status: AC
  Administered 2015-02-03 (×3): 40 meq via ORAL
  Filled 2015-02-03 (×3): qty 2

## 2015-02-03 NOTE — Consult Note (Signed)
WOC visited with patient this am to assess ostomy pouching system. The change over to the flexible convex pouch with the barrier ring and belt has been successful and is working well. Will plan to change pouch in am with daughter in law and patient.  Supplies in the room.  Urine is dirty again, concerned for fistula drainage again.  Pt is concerned will discuss with surgery team when they make rounds today.  Areanna Gengler [place] RN,CWOCN [phone]

## 2015-02-03 NOTE — Progress Notes (Signed)
RN ambulated patient to bathroom and monitored output of 127ml, but noted that consistency of urine did not look clear and appeared thicker and mixed. Mixed in appeared was brown and similar to what is draining from JP drain. Will notify MD when they make rounds to bedside to further describe.

## 2015-02-03 NOTE — Progress Notes (Signed)
Patient ID: Michele Palmer, female   DOB: Jan 03, 1936, 79 y.o.   MRN: [id number]     [facility]      [address] [place], [place] [address]    Phone: [phone] FAX: [phone]     Subjective: Afebrile.  WBC normal.  Pt tolerating PO.  Minimal pain.  No further n/v.    Objective:  Vital signs:  Filed Vitals:   02/03/15 0529 02/03/15 0533 02/03/15 0620 02/03/15 1006  BP:  117/47 121/62 140/66  Pulse:      Temp:  98.2 F (36.8 C)    TempSrc:  Oral    Resp:  18    Height:      Weight: 200 lb (90.719 kg)     SpO2:  93%      Last BM Date: 02/03/15  Intake/Output   Yesterday:  03/08 0701 - 03/09 0700 In: 3068 [P.O.:838; I.V.:2220] Out: 47 [Urine:870; Drains:55; Stool:150] This shift:  Total I/O In: 1248.8 [I.V.:1238.8; Other:10] Out: 110 [Urine:100; Drains:10]   Physical Exam: General: Pt awake/alert/oriented x4 in no acute distress Chest: cta. No chest wall pain w good excursion CV: Pulses intact. Regular rhythm Abdomen: Soft. Nondistended. Midline incision is c/d/i,steri strips in place.  ostomy functioning. LLQ drain with purulent, feculent output. No evidence of peritonitis. No incarcerated hernias. Ext: SCDs BLE. No mjr edema. No cyanosis Skin: No petechiae / purpura Problem List:   Principal Problem:   Intestinal diverticular abscess Active Problems:   Essential hypertension   Hypokalemia   Abdominal abscess   Colonic diverticular abscess   Anxiety state   Depression    Results:   Labs: Results for orders placed or performed during the hospital encounter of 01/15/15 (from the past 48 hour(s))  Glucose, capillary     Status: Abnormal   Collection Time: 02/01/15 11:52 AM  Result Value Ref Range   Glucose-Capillary 144 (H) 70 - 99 mg/dL  Glucose, capillary     Status: Abnormal   Collection Time: 02/01/15  5:14 PM  Result Value Ref Range   Glucose-Capillary 109 (H) 70 - 99 mg/dL   Glucose, capillary     Status: Abnormal   Collection Time: 02/01/15  8:04 PM  Result Value Ref Range   Glucose-Capillary 103 (H) 70 - 99 mg/dL  Protime-INR     Status: Abnormal   Collection Time: 02/02/15 12:08 AM  Result Value Ref Range   Prothrombin Time 16.0 (H) 11.6 - 15.2 seconds   INR 1.27 0.00 - 1.49  Glucose, capillary     Status: Abnormal   Collection Time: 02/02/15 12:14 AM  Result Value Ref Range   Glucose-Capillary 104 (H) 70 - 99 mg/dL  Glucose, capillary     Status: None   Collection Time: 02/02/15  4:43 AM  Result Value Ref Range   Glucose-Capillary 90 70 - 99 mg/dL  Glucose, capillary     Status: None   Collection Time: 02/02/15  8:03 AM  Result Value Ref Range   Glucose-Capillary 98 70 - 99 mg/dL  Culture, routine-abscess     Status: None (Preliminary result)   Collection Time: 02/02/15 10:07 AM  Result Value Ref Range   Specimen Description ABSCESS ABDOMEN    Special Requests CT GUIDED ASPIR OF DIVERTICULAR ABSCESS    Gram Stain      ABUNDANT WBC PRESENT,BOTH PMN AND MONONUCLEAR NO SQUAMOUS EPITHELIAL CELLS SEEN MODERATE GRAM POSITIVE RODS MODERATE GRAM POSITIVE COCCI IN PAIRS MODERATE GRAM NEGATIVE  RODS    Culture NO GROWTH Performed at [facility]     Report Status PENDING   Anaerobic culture     Status: None (Preliminary result)   Collection Time: 02/02/15 10:07 AM  Result Value Ref Range   Specimen Description ABSCESS ABDOMEN    Special Requests CT GUIDED ASPR OF DIVERTICULAR ABSCESS    Gram Stain PENDING    Culture      NO ANAEROBES ISOLATED; CULTURE IN PROGRESS FOR 5 DAYS Performed at [facility]    Report Status PENDING   Glucose, capillary     Status: Abnormal   Collection Time: 02/02/15 11:56 AM  Result Value Ref Range   Glucose-Capillary 150 (H) 70 - 99 mg/dL  Glucose, capillary     Status: Abnormal   Collection Time: 02/02/15  5:20 PM  Result Value Ref Range   Glucose-Capillary 135 (H) 70 - 99 mg/dL  Glucose,  capillary     Status: None   Collection Time: 02/02/15  9:54 PM  Result Value Ref Range   Glucose-Capillary 98 70 - 99 mg/dL  CBC     Status: Abnormal   Collection Time: 02/03/15  5:05 AM  Result Value Ref Range   WBC 7.0 4.0 - 10.5 K/uL   RBC 2.80 (L) 3.87 - 5.11 MIL/uL   Hemoglobin 8.1 (L) 12.0 - 15.0 g/dL   HCT 25.7 (L) 36.0 - 46.0 %   MCV 91.8 78.0 - 100.0 fL   MCH 28.9 26.0 - 34.0 pg   MCHC 31.5 30.0 - 36.0 g/dL   RDW 14.6 11.5 - 15.5 %   Platelets 297 150 - 400 K/uL  Basic metabolic panel     Status: Abnormal   Collection Time: 02/03/15  5:05 AM  Result Value Ref Range   Sodium 138 135 - 145 mmol/L   Potassium 2.8 (L) 3.5 - 5.1 mmol/L   Chloride 105 96 - 112 mmol/L   CO2 28 19 - 32 mmol/L   Glucose, Bld 114 (H) 70 - 99 mg/dL   BUN 8 6 - 23 mg/dL   Creatinine, Ser 0.55 0.50 - 1.10 mg/dL   Calcium 8.3 (L) 8.4 - 10.5 mg/dL   GFR calc non Af Amer 88 (L) >90 mL/min   GFR calc Af Amer >90 >90 mL/min    Comment: (NOTE) The eGFR has been calculated using the CKD EPI equation. This calculation has not been validated in all clinical situations. eGFR's persistently <90 mL/min signify possible Chronic Kidney Disease.    Anion gap 5 5 - 15  Glucose, capillary     Status: Abnormal   Collection Time: 02/03/15  7:50 AM  Result Value Ref Range   Glucose-Capillary 101 (H) 70 - 99 mg/dL    Imaging / Studies: Ct Abdomen Pelvis W Contrast  02/01/2015   CLINICAL DATA:  79 year old with history of an intra-abdominal abscess. History of colectomy and repair of colovesical fistula. Patient currently has leukocytosis.  EXAM: CT ABDOMEN AND PELVIS WITH CONTRAST  TECHNIQUE: Multidetector CT imaging of the abdomen and pelvis was performed using the standard protocol following bolus administration of intravenous contrast.  CONTRAST:  134m OMNIPAQUE IOHEXOL 300 MG/ML  SOLN  COMPARISON:  CT 01/16/2015  FINDINGS: Patchy densities at lung bases are most compatible with atelectasis. No evidence for  pleural effusions. There are coronary artery calcifications. Negative for free intraperitoneal air.  Normal appearance of the liver, gallbladder and portal venous system. Normal appearance of the pancreas, spleen and adrenal tissue.  Normal appearance of both kidneys without hydronephrosis.  Atherosclerotic calcifications involving the abdominal aorta without aneurysm.  There is an air-fluid collection along the superior aspect of the urinary bladder that measures 6.2 x 4.6 x 4.9 cm. The collection is adjacent to the bladder dome and there is marked wall thickening of the bladder dome. The collection is compatible with an abscess. Small amount a gas in the anterior bladder. This abscess is also directly adjacent to the sigmoid colon. There is diverticulosis throughout the sigmoid colon. Uterus is displaced to the left side due to the abscess. There appears to be a small amount of focal fluid just anterior to the rectum on sequence 2, image 78 which measures up to 2.2 cm. This small amount of fluid is nonspecific. Limited evaluation of the adnexal tissue.  There is a diverting loop colostomy in the left lower abdomen. The colon is diverted proximal to the pelvic abscess collection. There is a small amount of subcutaneous and intramuscular gas at the colostomy site. There is no significant bowel dilatation. Normal appearance of the small bowel. Small lymph nodes in the abdomen.  No acute bone abnormality. Mild degenerative facet arthropathy in the lower lumbar spine. Multiple levels with vacuum disc phenomena.  IMPRESSION: Air-fluid collection in the pelvis is most compatible with an abscess. This abscess measures up to 6.2 cm and it is situated between the bladder and the sigmoid colon. There is a small amount of gas along the anterior urinary bladder and these findings raise concern for a persistent colovesical fistula. The pelvic abscess collection appears to be amendable to percutaneous drainage from an anterior  approach.  2.2 cm low-density collection in the posterior pelvis is nonspecific.  Postsurgical changes compatible with a diverting loop colostomy.   Electronically Signed   By: Markus Daft M.D.   On: 02/01/2015 14:30   Ct Image Guided Drainage By Percutaneous Catheter  02/02/2015   CLINICAL DATA:  History of sigmoid colonic diverticulitis with prior abscess drainage on 01/16/2015. After drain removal, there is a recurrent diverticular abscess of the pelvis. This no requires percutaneous drainage.  EXAM: CT GUIDED DRAINAGE OF PERITONEAL DIVERTICULAR ABSCESS  ANESTHESIA/SEDATION: 1.5 Mg IV Versed 100 mcg IV Fentanyl  Total Moderate Sedation Time:  22 minutes  PROCEDURE: The procedure, risks, benefits, and alternatives were explained to the patient. Questions regarding the procedure were encouraged and answered. The patient understands and consents to the procedure. A time-out was performed prior to the procedure.  The lower anterior abdominal wall was prepped with Betadine in a sterile fashion, and a sterile drape was applied covering the operative field. A sterile gown and sterile gloves were used for the procedure. Local anesthesia was provided with 1% Lidocaine.  Prior to the procedure, a Foley catheter was placed to decompress the bladder due to location of the recurrent diverticular abscess immediately superior to the bladder. CT was performed in a supine position.  Under CT guidance, an 18 gauge trocar needle was advanced into the midline pelvic abscess. Fluid was aspirated and sent for culture analysis. A guidewire was advanced into the collection. The tract was dilated and a 12 French percutaneous drainage catheter placed. The catheter position was confirmed by CT. The catheter was connected to a suction bulb. It was secured at the skin with a Prolene retention suture and StatLock device.  COMPLICATIONS: None.  FINDINGS: CT again demonstrates a recurrent abscess in the midline anterior pelvis just superior to  the bladder. Aspiration yielded purulent and feculent  fluid. After drainage catheter placement, there is excellent return of fluid from the catheter. Output will be followed.  IMPRESSION: CT-guided percutaneous drainage of midline peritoneal diverticular abscess located in the pelvis. A 12 French drain was placed from an anterior approach under CT guidance. This drain was attached to suction bulb drainage.   Electronically Signed   By: Aletta Edouard M.D.   On: 02/02/2015 13:20    Medications / Allergies:  Scheduled Meds: . enoxaparin (LOVENOX) injection  40 mg Subcutaneous Q24H  . ertapenem  1 g Intravenous Q24H  . feeding supplement (PRO-STAT SUGAR FREE 64)  30 mL Oral BID  . feeding supplement (RESOURCE BREEZE)  1 Container Oral BID BM  . insulin aspart  0-15 Units Subcutaneous TID AC & HS  . losartan  100 mg Oral Daily  . mirabegron ER  50 mg Oral Daily  . pantoprazole  40 mg Oral Daily  . potassium chloride  40 mEq Oral TID WC  . sodium chloride  10-40 mL Intracatheter Q12H  . sodium chloride  3 mL Intravenous Q12H   Continuous Infusions: . sodium chloride 75 mL/hr at 02/02/15 2349   PRN Meds:.acetaminophen **OR** acetaminophen, alum & mag hydroxide-simeth, hyoscyamine, levalbuterol, ondansetron **OR** ondansetron (ZOFRAN) IV, oxyCODONE, sodium chloride  Antibiotics: Anti-infectives    Start     Dose/Rate Route Frequency Ordered Stop   02/01/15 1700  ertapenem (INVANZ) 1 g in sodium chloride 0.9 % 50 mL IVPB     1 g 100 mL/hr over 30 Minutes Intravenous Every 24 hours 02/01/15 1619     01/17/15 2100  ertapenem (INVANZ) 1 g in sodium chloride 0.9 % 50 mL IVPB  Status:  Discontinued     1 g 100 mL/hr over 30 Minutes Intravenous Every 24 hours 01/17/15 2012 01/27/15 1058   01/16/15 2100  metroNIDAZOLE (FLAGYL) IVPB 500 mg  Status:  Discontinued     500 mg 100 mL/hr over 60 Minutes Intravenous Every 8 hours 01/16/15 2026 01/17/15 1019   01/16/15 0315  piperacillin-tazobactam  (ZOSYN) IVPB 3.375 g  Status:  Discontinued     3.375 g 12.5 mL/hr over 240 Minutes Intravenous Every 8 hours 01/16/15 0301 01/17/15 2012   01/15/15 1830  piperacillin-tazobactam (ZOSYN) IVPB 3.375 g     3.375 g 100 mL/hr over 30 Minutes Intravenous  Once 01/15/15 1807 01/15/15 2059       Assessment/Plan Diverticulitis with colovesical fistula POD #13 s/p Diverting loop colostomy (Dr. Hulen Skains) -tolerating soft diet, Ostomy functioning, nausea has resolved, meeting caloric needs, pain controlled by PO pain meds -Ambulate and IS -WOC nursing for colostomy -Feeding supplements, Resource Breeze Deconditioning -PT eval and treat Abscess between sigmoid colon and bladder -IR drain today, feculent output -follow culture--GPC, GNR, GPR -Invanz resumed D#4 -hopefully we can DC with drain, we will be slow to remove. If she continues to have an abscess, then she may need a resection. Dr. Karsten Ro has seen the patient this admission.  PCM -tolerating PO, off TPN UTI, secondary to colovesical fistula -urine culture 3/6 e coli, d/w pharmacy, Colbert Ewing is ok VTE proph -lovenox, SCD's HTN -on cozaar Diabetes Mellitus -a1c 6.5 -no meds recommended by medicine, outpatient f/u with PCP Hypokalemia -supplement, repeat BMP in AM   Erby Pian, ANP-BC [facility] Pager [phone](7A-4:30P) For consults and floor pages call [phone](7A-4:30P)  02/03/2015 10:25 AM

## 2015-02-03 NOTE — Progress Notes (Signed)
Referring Physician(s): CCS  Subjective: Patient denies any abdominal pain. She does admit to brown foul smelling output mixed in with her urine today.   Allergies: Aspirin; Ciprofloxacin; Codeine; and Sulfur  Medications: Prior to Admission medications   Medication Sig Start Date End Date Taking? Authorizing Provider  ALPRAZolam (XANAX) 0.25 MG tablet Take 0.25 mg by mouth 2 (two) times daily as needed for anxiety.    Yes Historical Provider, MD  anastrozole (ARIMIDEX) 1 MG tablet Take 1 tablet (1 mg total) by mouth daily. 02/09/14  Yes Consuela Mimes, MD  citalopram (CELEXA) 20 MG tablet Take 10 mg by mouth every evening.  01/03/15  Yes Historical Provider, MD  losartan (COZAAR) 100 MG tablet Take 100 mg by mouth daily.   Yes Historical Provider, MD  ranitidine (ZANTAC) 150 MG capsule Take 150 mg by mouth daily as needed.    Yes Historical Provider, MD  traMADol (ULTRAM) 50 MG tablet Take 50 mg by mouth 2 (two) times daily.  01/13/15  Yes Historical Provider, MD  oxyCODONE-acetaminophen (PERCOCET/ROXICET) 5-325 MG per tablet Take 1-2 tablets by mouth every 4 (four) hours as needed for moderate pain. 01/28/15   Saverio Danker, PA-C  polyethylene glycol (MIRALAX / GLYCOLAX) packet Take 17 g by mouth daily. 01/28/15   Saverio Danker, PA-C    Vital Signs: BP 121/62 mmHg  Pulse 83  Temp(Src) 98.2 F (36.8 C) (Oral)  Resp 18  Ht 5\' 3"  (1.6 m)  Wt 200 lb (90.719 kg)  BMI 35.44 kg/m2  SpO2 93%  Physical Exam General: A&Ox3, NAD Abd: Colostomy intact, soft, ND, NT, Right lower pelvic perc drain C/D/I, NT, feculent/purulent output 55cc/24hr 10 cc in bulb now, Cx pending  Imaging: Dg Chest 2 View  01/31/2015   CLINICAL DATA:  Initial evaluation elevated white blood count  EXAM: CHEST  2 VIEW  COMPARISON:  07/29/2013  FINDINGS: Mild cardiac enlargement. Vascular pattern normal. Obliquely oriented bands of linear and platelike opacities extending into both hila. No consolidation or effusion.  Moderate bilateral perihilar bronchial wall thickening. Right PICC line with tip just beyond the cavoatrial junction.  IMPRESSION: Bilateral bronchitic change. Multiple obliquely oriented linear opacities bilaterally. These areas appear most consistent with regions of discoid atelectasis. Focal bacterial pneumonia not identified.   Electronically Signed   By: Skipper Cliche M.D.   On: 01/31/2015 18:16   Ct Abdomen Pelvis W Contrast  02/01/2015   CLINICAL DATA:  79 year old with history of an intra-abdominal abscess. History of colectomy and repair of colovesical fistula. Patient currently has leukocytosis.  EXAM: CT ABDOMEN AND PELVIS WITH CONTRAST  TECHNIQUE: Multidetector CT imaging of the abdomen and pelvis was performed using the standard protocol following bolus administration of intravenous contrast.  CONTRAST:  170mL OMNIPAQUE IOHEXOL 300 MG/ML  SOLN  COMPARISON:  CT 01/16/2015  FINDINGS: Patchy densities at lung bases are most compatible with atelectasis. No evidence for pleural effusions. There are coronary artery calcifications. Negative for free intraperitoneal air.  Normal appearance of the liver, gallbladder and portal venous system. Normal appearance of the pancreas, spleen and adrenal tissue. Normal appearance of both kidneys without hydronephrosis.  Atherosclerotic calcifications involving the abdominal aorta without aneurysm.  There is an air-fluid collection along the superior aspect of the urinary bladder that measures 6.2 x 4.6 x 4.9 cm. The collection is adjacent to the bladder dome and there is marked wall thickening of the bladder dome. The collection is compatible with an abscess. Small amount a gas in the  anterior bladder. This abscess is also directly adjacent to the sigmoid colon. There is diverticulosis throughout the sigmoid colon. Uterus is displaced to the left side due to the abscess. There appears to be a small amount of focal fluid just anterior to the rectum on sequence 2, image  78 which measures up to 2.2 cm. This small amount of fluid is nonspecific. Limited evaluation of the adnexal tissue.  There is a diverting loop colostomy in the left lower abdomen. The colon is diverted proximal to the pelvic abscess collection. There is a small amount of subcutaneous and intramuscular gas at the colostomy site. There is no significant bowel dilatation. Normal appearance of the small bowel. Small lymph nodes in the abdomen.  No acute bone abnormality. Mild degenerative facet arthropathy in the lower lumbar spine. Multiple levels with vacuum disc phenomena.  IMPRESSION: Air-fluid collection in the pelvis is most compatible with an abscess. This abscess measures up to 6.2 cm and it is situated between the bladder and the sigmoid colon. There is a small amount of gas along the anterior urinary bladder and these findings raise concern for a persistent colovesical fistula. The pelvic abscess collection appears to be amendable to percutaneous drainage from an anterior approach.  2.2 cm low-density collection in the posterior pelvis is nonspecific.  Postsurgical changes compatible with a diverting loop colostomy.   Electronically Signed   By: Markus Daft M.D.   On: 02/01/2015 14:30   Ct Image Guided Drainage By Percutaneous Catheter  02/02/2015   CLINICAL DATA:  History of sigmoid colonic diverticulitis with prior abscess drainage on 01/16/2015. After drain removal, there is a recurrent diverticular abscess of the pelvis. This no requires percutaneous drainage.  EXAM: CT GUIDED DRAINAGE OF PERITONEAL DIVERTICULAR ABSCESS  ANESTHESIA/SEDATION: 1.5 Mg IV Versed 100 mcg IV Fentanyl  Total Moderate Sedation Time:  22 minutes  PROCEDURE: The procedure, risks, benefits, and alternatives were explained to the patient. Questions regarding the procedure were encouraged and answered. The patient understands and consents to the procedure. A time-out was performed prior to the procedure.  The lower anterior abdominal  wall was prepped with Betadine in a sterile fashion, and a sterile drape was applied covering the operative field. A sterile gown and sterile gloves were used for the procedure. Local anesthesia was provided with 1% Lidocaine.  Prior to the procedure, a Foley catheter was placed to decompress the bladder due to location of the recurrent diverticular abscess immediately superior to the bladder. CT was performed in a supine position.  Under CT guidance, an 18 gauge trocar needle was advanced into the midline pelvic abscess. Fluid was aspirated and sent for culture analysis. A guidewire was advanced into the collection. The tract was dilated and a 12 French percutaneous drainage catheter placed. The catheter position was confirmed by CT. The catheter was connected to a suction bulb. It was secured at the skin with a Prolene retention suture and StatLock device.  COMPLICATIONS: None.  FINDINGS: CT again demonstrates a recurrent abscess in the midline anterior pelvis just superior to the bladder. Aspiration yielded purulent and feculent fluid. After drainage catheter placement, there is excellent return of fluid from the catheter. Output will be followed.  IMPRESSION: CT-guided percutaneous drainage of midline peritoneal diverticular abscess located in the pelvis. A 12 French drain was placed from an anterior approach under CT guidance. This drain was attached to suction bulb drainage.   Electronically Signed   By: Aletta Edouard M.D.   On: 02/02/2015 13:20  Labs:  CBC:  Recent Labs  01/21/15 0600 01/26/15 0100 01/31/15 0517 02/03/15 0505  WBC 10.1 8.5 12.6* 7.0  HGB 10.0* 8.5* 8.5* 8.1*  HCT 31.5* 26.9* 26.8* 25.7*  PLT 214 232 301 297    COAGS:  Recent Labs  01/16/15 0905 02/02/15 0008  INR 1.13 1.27    BMP:  Recent Labs  01/26/15 0525 01/27/15 0545 01/29/15 0448 01/31/15 0517 02/03/15 0505  NA 135 131*  --  134* 138  K 4.3 4.0  --  3.5 2.8*  CL 96 100  --  99 105  CO2 31 24   --  32 28  GLUCOSE 112* 121*  --  113* 114*  BUN 14 13  --  8 8  CALCIUM 9.1 8.3*  --  8.4 8.3*  CREATININE 0.65 0.64 0.80 0.60 0.55  GFRNONAA 83* 84* 69* 86* 88*  GFRAA >90 >90 80* >90 >90    LIVER FUNCTION TESTS:  Recent Labs  01/18/15 0513 01/21/15 0600 01/25/15 0440 01/31/15 0517  BILITOT 0.3 0.2* 0.1* 0.5  AST 13 24 23 17   ALT 7 10 11 12   ALKPHOS 71 82 98 100  PROT 5.7* 6.1 5.8* 5.7*  ALBUMIN 1.7* 1.8* 1.6* 1.6*    Assessment and Plan: Recurrent colonic diverticulitis abscess with colovesical fistula s/p diverting loop colostomy, history of previous percutaneous drain placement 01/16/15 with CT imaging and drain injection on 01/25/15 which revealed decompressed abscess and no evidence of fistula-drain was removed Presented 3/7 with recurrent abscess s/p perc drain placed 3/8 with feculent/purulent output and feculent drainage into urine consistent with CV fistula. Plans per CCS   Signed: Hedy Jacob 02/03/2015, 9:32 AM   I spent a total of 15 Minutes in face to face in clinical consultation/evaluation, greater than 50% of which was counseling/coordinating care for recurrent colonic diverticular abscess.

## 2015-02-03 NOTE — Progress Notes (Signed)
Chaplain followed up with pt regarding previous chaplain visit.  A visiting pastor was visiting with pt and family.  Pt's dinner had just arrived so visiting pastor prayed, chaplain made introductions and pt asked for a later return visit.  Chaplain will follow up.    02/03/15 1500  Clinical Encounter Type  Visited With Patient and family together;Other (Comment) (Family pastor also present.)  Visit Type Follow-up;Spiritual support;Social support  Referral From Chaplain;Nurse  Spiritual Encounters  Spiritual Needs Prayer;Emotional  Stress Factors  Patient Stress Factors Exhausted;Health changes  Family Stress Factors None identified   Geralyn Flash 02/03/2015 3:45 PM
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
## 2015-02-04 LAB — BASIC METABOLIC PANEL
Anion gap: 4 — ABNORMAL LOW (ref 5–15)
BUN: 5 mg/dL — AB (ref 6–23)
CO2: 27 mmol/L (ref 19–32)
Calcium: 8 mg/dL — ABNORMAL LOW (ref 8.4–10.5)
Chloride: 107 mmol/L (ref 96–112)
Creatinine, Ser: 0.47 mg/dL — ABNORMAL LOW (ref 0.50–1.10)
GFR calc Af Amer: >90 mL/min (ref >90)
GFR calc non Af Amer: >90 mL/min (ref >90)
Glucose, Bld: 103 mg/dL — ABNORMAL HIGH (ref 70–99)
Potassium: 3.9 mmol/L (ref 3.5–5.1)
Sodium: 138 mmol/L (ref 135–145)

## 2015-02-04 LAB — GLUCOSE, CAPILLARY
GLUCOSE-CAPILLARY: 110 mg/dL — AB (ref 70–99)
GLUCOSE-CAPILLARY: 109 mg/dL — AB (ref 70–99)
GLUCOSE-CAPILLARY: 95 mg/dL (ref 70–99)
Glucose-Capillary: 124 mg/dL — ABNORMAL HIGH (ref 70–99)

## 2015-02-04 MED ORDER — ALTEPLASE 2 MG IJ SOLR
2.0000 mg | Freq: Once | INTRAMUSCULAR | Status: AC
  Administered 2015-02-04: 2 mg
  Filled 2015-02-04: qty 2

## 2015-02-04 NOTE — Progress Notes (Signed)
Physical Therapy Treatment Patient Details Name: Michele Palmer MRN: [id number] DOB: [birth year] Today's Date: 02/04/2015    History of Present Illness Abdominal pain. 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  Pt had diverting loop colostomy 01/21/15.    PT Comments    Progressing steadily.  Still overall generally weak, but should be safe in a homelike environment with RW and assist of family.  Follow Up Recommendations  Home health PT;Supervision for mobility/OOB     Equipment Recommendations  Rolling walker with 5" wheels    Recommendations for Other Services       Precautions / Restrictions Precautions Precautions: Fall Precaution Comments: colostomy Restrictions Weight Bearing Restrictions: No    Mobility  Bed Mobility Overal bed mobility: Needs Assistance Bed Mobility: Supine to Sit;Sit to Supine     Supine to sit: Min guard Sit to supine: Min guard      Transfers Overall transfer level: Needs assistance Equipment used: Rolling walker (2 wheeled) Transfers: Sit to/from Stand Sit to Stand: Min assist         General transfer comment: cues for hand placement.  Ambulation/Gait Ambulation/Gait assistance: Min guard Ambulation Distance (Feet): 160 Feet Assistive device: Rolling walker (2 wheeled) Gait Pattern/deviations: Step-through pattern Gait velocity: decreased   General Gait Details: mildly unsteady with RW   Stairs            Wheelchair Mobility    Modified Rankin (Stroke Patients Only)       Balance Overall balance assessment: Needs assistance Sitting-balance support: No upper extremity supported Sitting balance-Leahy Scale: Good     Standing balance support: No upper extremity supported Standing balance-Leahy Scale: Fair Standing balance comment: based on brushing teeth at the sink                     Cognition Arousal/Alertness: Awake/alert Behavior  During Therapy: WFL for tasks assessed/performed Overall Cognitive Status: Within Functional Limits for tasks assessed                      Exercises General Exercises - Lower Extremity Quad Sets: AROM;Both;10 reps;Supine Gluteal Sets: AROM;Both;10 reps;Supine Heel Slides: AROM;Both;10 reps;Supine Straight Leg Raises: AROM;AAROM;Both;10 reps;Supine Other Exercises Other Exercises: bicep/tricep pressess x10 reps.    General Comments        Pertinent Vitals/Pain Pain Assessment: Faces Faces Pain Scale: Hurts little more Pain Location: lower abdomen Pain Descriptors / Indicators: Grimacing;Tender Pain Intervention(s): Monitored during session;Limited activity within patient's tolerance    Home Living                      Prior Function            PT Goals (current goals can now be found in the care plan section) Acute Rehab PT Goals Patient Stated Goal: not stated PT Goal Formulation: With patient/family Time For Goal Achievement: 02/08/15 Potential to Achieve Goals: Good Progress towards PT goals: Progressing toward goals    Frequency  Min 3X/week    PT Plan Current plan remains appropriate    Co-evaluation             End of Session   Activity Tolerance: Patient tolerated treatment well Patient left: in bed;with call bell/phone within reach;with family/visitor present     Time: 7425-9563 PT Time Calculation (min) (ACUTE ONLY): 23 min  Charges:  $Gait Training: 8-22 mins $Therapeutic Exercise: 8-22 mins  G Codes:      Leidy Massar, Tessie Fass 02/04/2015, 2:55 PM 02/04/2015  Donnella Sham, [place] [phone]  (pager)

## 2015-02-04 NOTE — Progress Notes (Signed)
Patient ID: Michele Palmer, female   DOB: [birth year], 79 y.o.   MRN: [id number]     [facility]      [address] [place], [place] [address]    Phone: [phone] FAX: [phone]     Subjective: C/o stool when voiding.  Afebrile.  Tolerating POs.    Objective:  Vital signs:  Filed Vitals:   02/03/15 2111 02/04/15 0510 02/04/15 0700 02/04/15 1036  BP: 132/60 135/65  135/65  Pulse: 88 73    Temp: 98.7 F (37.1 C) 98.7 F (37.1 C)    TempSrc: Oral Oral    Resp: 24 16    Height:      Weight:   199 lb 4.8 oz (90.402 kg)   SpO2: 94% 96%      Last BM Date: 02/04/15  Intake/Output   Yesterday:  03/09 0701 - 03/10 0700 In: 2703.8 [P.O.:800; I.V.:1823.8; IV Piggyback:50] Out: 4132 [GMWNU:2725; Drains:40; Stool:150] This shift:  Total I/O In: 1383.8 [P.O.:120; I.V.:1253.8; Other:10] Out: 310 [Urine:300; Drains:10]  Physical Exam: General: Pt awake/alert/oriented x4 in no acute distress Chest: cta. No chest wall pain w good excursion CV: Pulses intact. Regular rhythm Abdomen: Soft. Nondistended. Midline incision is c/d/i,steri strips in place.  ostomy functioning. LLQ drain with purulent, feculent output. No evidence of peritonitis. No incarcerated hernias. Ext: SCDs BLE. No mjr edema. No cyanosis Skin: No petechiae / purpura  Problem List:   Principal Problem:   Intestinal diverticular abscess Active Problems:   Essential hypertension   Hypokalemia   Abdominal abscess   Colonic diverticular abscess   Anxiety state   Depression    Results:   Labs: Results for orders placed or performed during the hospital encounter of 01/15/15 (from the past 48 hour(s))  Glucose, capillary     Status: Abnormal   Collection Time: 02/02/15 11:56 AM  Result Value Ref Range   Glucose-Capillary 150 (H) 70 - 99 mg/dL  Glucose, capillary     Status: Abnormal   Collection Time: 02/02/15  5:20 PM  Result Value Ref Range   Glucose-Capillary 135 (H) 70 - 99 mg/dL  Glucose, capillary     Status: None   Collection Time: 02/02/15  9:54 PM  Result Value Ref Range   Glucose-Capillary 98 70 - 99 mg/dL  CBC     Status: Abnormal   Collection Time: 02/03/15  5:05 AM  Result Value Ref Range   WBC 7.0 4.0 - 10.5 K/uL   RBC 2.80 (L) 3.87 - 5.11 MIL/uL   Hemoglobin 8.1 (L) 12.0 - 15.0 g/dL   HCT 25.7 (L) 36.0 - 46.0 %   MCV 91.8 78.0 - 100.0 fL   MCH 28.9 26.0 - 34.0 pg   MCHC 31.5 30.0 - 36.0 g/dL   RDW 14.6 11.5 - 15.5 %   Platelets 297 150 - 400 K/uL  Basic metabolic panel     Status: Abnormal   Collection Time: 02/03/15  5:05 AM  Result Value Ref Range   Sodium 138 135 - 145 mmol/L   Potassium 2.8 (L) 3.5 - 5.1 mmol/L   Chloride 105 96 - 112 mmol/L   CO2 28 19 - 32 mmol/L   Glucose, Bld 114 (H) 70 - 99 mg/dL   BUN 8 6 - 23 mg/dL   Creatinine, Ser 0.55 0.50 - 1.10 mg/dL   Calcium 8.3 (L) 8.4 - 10.5 mg/dL   GFR calc non Af Amer 88 (L) >90 mL/min  GFR calc Af Amer >90 >90 mL/min    Comment: (NOTE) The eGFR has been calculated using the CKD EPI equation. This calculation has not been validated in all clinical situations. eGFR's persistently <90 mL/min signify possible Chronic Kidney Disease.    Anion gap 5 5 - 15  Glucose, capillary     Status: Abnormal   Collection Time: 02/03/15  7:50 AM  Result Value Ref Range   Glucose-Capillary 101 (H) 70 - 99 mg/dL  Glucose, capillary     Status: Abnormal   Collection Time: 02/03/15 12:30 PM  Result Value Ref Range   Glucose-Capillary 105 (H) 70 - 99 mg/dL  Glucose, capillary     Status: Abnormal   Collection Time: 02/03/15  5:16 PM  Result Value Ref Range   Glucose-Capillary 141 (H) 70 - 99 mg/dL  Glucose, capillary     Status: Abnormal   Collection Time: 02/03/15  9:13 PM  Result Value Ref Range   Glucose-Capillary 115 (H) 70 - 99 mg/dL  Basic metabolic panel     Status: Abnormal   Collection Time: 02/04/15  7:34 AM  Result Value Ref Range   Sodium  138 135 - 145 mmol/L   Potassium 3.9 3.5 - 5.1 mmol/L   Chloride 107 96 - 112 mmol/L   CO2 27 19 - 32 mmol/L   Glucose, Bld 103 (H) 70 - 99 mg/dL   BUN 5 (L) 6 - 23 mg/dL   Creatinine, Ser 0.47 (L) 0.50 - 1.10 mg/dL   Calcium 8.0 (L) 8.4 - 10.5 mg/dL   GFR calc non Af Amer >90 >90 mL/min   GFR calc Af Amer >90 >90 mL/min    Comment: (NOTE) The eGFR has been calculated using the CKD EPI equation. This calculation has not been validated in all clinical situations. eGFR's persistently <90 mL/min signify possible Chronic Kidney Disease.    Anion gap 4 (L) 5 - 15  Glucose, capillary     Status: Abnormal   Collection Time: 02/04/15  7:56 AM  Result Value Ref Range   Glucose-Capillary 110 (H) 70 - 99 mg/dL    Imaging / Studies: No results found.  Medications / Allergies:  Scheduled Meds: . enoxaparin (LOVENOX) injection  40 mg Subcutaneous Q24H  . ertapenem  1 g Intravenous Q24H  . feeding supplement (PRO-STAT SUGAR FREE 64)  30 mL Oral BID  . feeding supplement (RESOURCE BREEZE)  1 Container Oral BID BM  . insulin aspart  0-15 Units Subcutaneous TID AC & HS  . losartan  100 mg Oral Daily  . mirabegron ER  50 mg Oral Daily  . pantoprazole  40 mg Oral Daily  . sodium chloride  10-40 mL Intracatheter Q12H  . sodium chloride  3 mL Intravenous Q12H   Continuous Infusions: . sodium chloride 75 mL/hr at 02/04/15 0158   PRN Meds:.acetaminophen **OR** acetaminophen, alum & mag hydroxide-simeth, hyoscyamine, levalbuterol, ondansetron **OR** ondansetron (ZOFRAN) IV, oxyCODONE, sodium chloride  Antibiotics: Anti-infectives    Start     Dose/Rate Route Frequency Ordered Stop   02/01/15 1700  ertapenem (INVANZ) 1 g in sodium chloride 0.9 % 50 mL IVPB     1 g 100 mL/hr over 30 Minutes Intravenous Every 24 hours 02/01/15 1619     01/17/15 2100  ertapenem (INVANZ) 1 g in sodium chloride 0.9 % 50 mL IVPB  Status:  Discontinued     1 g 100 mL/hr over 30 Minutes Intravenous Every 24 hours  01/17/15 2012 01/27/15 1058  01/16/15 2100  metroNIDAZOLE (FLAGYL) IVPB 500 mg  Status:  Discontinued     500 mg 100 mL/hr over 60 Minutes Intravenous Every 8 hours 01/16/15 2026 01/17/15 1019   01/16/15 0315  piperacillin-tazobactam (ZOSYN) IVPB 3.375 g  Status:  Discontinued     3.375 g 12.5 mL/hr over 240 Minutes Intravenous Every 8 hours 01/16/15 0301 01/17/15 2012   01/15/15 1830  piperacillin-tazobactam (ZOSYN) IVPB 3.375 g     3.375 g 100 mL/hr over 30 Minutes Intravenous  Once 01/15/15 1807 01/15/15 2059        Assessment/Plan Diverticulitis with colovesical fistula POD #13 s/p Diverting loop colostomy (Dr. Hulen Skains) -tolerating soft diet, Ostomy functioning, nausea has resolved, meeting caloric needs, pain controlled by PO pain meds -Ambulate and IS -WOC nursing for colostomy -Feeding supplements, Resource Breeze Deconditioning -PT eval and treat Abscess between sigmoid colon and bladder -IR drain today, feculent output, less -follow culture--GPC, GNR, GPR -Invanz resumed D#5, hopefully can narrow antibiotics and change to PO -hopefully we can DC home with drain, we will be slow to remove. well tentatively plan for tomorrow.  I will schedule her a follow up with Dr. Karsten Ro.  She already has an appt with Dr. Hulen Skains.  -pt will need surgery to fix the colovesical fistula in the future. -will also schedule f/u in the drain clinic  PCM -tolerating PO, off TPN UTI, secondary to colovesical fistula -urine culture 3/6 e coli, d/w pharmacy, Colbert Ewing is ok VTE proph -lovenox, SCD's HTN -on cozaar Diabetes Mellitus -a1c 6.5 -no meds recommended by medicine, outpatient f/u with PCP Hypokalemia -resolved Dispo--anticipate DC tomorrow  Erby Pian, [facility] Surgery Pager [phone](7A-4:30P) For consults and floor pages call [phone](7A-4:30P)  02/04/2015 11:10 AM

## 2015-02-04 NOTE — Consult Note (Signed)
WOC ostomy follow up Stoma type/location: LLQ, loop colostomy Stomal assessment/size: 1 1/4" x 2" oval,functional os at 1 o'clock.   Peristomal assessment: peristomal denudation improved, she does have some mucocutaneous separation now from 3-6 o'clock. Skin ulcer that was quite small has almost resolved.   Treatment options for stomal/peristomal skin: 2" barrier ring used to aid in seal  Output liquid, some paste like yellow stool  Ostomy pouching: 1pc flexible convex used with 2" barrier ring placed around the stoma prior to pouch placement. Ostomy belt being used.  Wear time with this pouching system has been 4 days.  Feel as this may be what she needs to use once DC to home.   Education provided: Daughter in law at the bedside, she has performed pouch change in the past and feels comfortable with changes.  Enrolled patient in [place] Start Discharge program: Yes  Added flexible convex pouch samples to her dc sample order.  [place] nurse will follow along with you for ostomy teaching and support.  Cecile Gillispie [place] RN,CWOCN [phone]

## 2015-02-05 LAB — CBC
HCT: 27.1 % — ABNORMAL LOW (ref 36.0–46.0)
Hemoglobin: 8.4 g/dL — ABNORMAL LOW (ref 12.0–15.0)
MCH: 28.5 pg (ref 26.0–34.0)
MCHC: 31 g/dL (ref 30.0–36.0)
MCV: 91.9 fL (ref 78.0–100.0)
PLATELETS: 321 10*3/uL (ref 150–400)
RBC: 2.95 MIL/uL — ABNORMAL LOW (ref 3.87–5.11)
RDW: 14.7 % (ref 11.5–15.5)
WBC: 6.9 10*3/uL (ref 4.0–10.5)

## 2015-02-05 LAB — GLUCOSE, CAPILLARY
GLUCOSE-CAPILLARY: 94 mg/dL (ref 70–99)
Glucose-Capillary: 111 mg/dL — ABNORMAL HIGH (ref 70–99)

## 2015-02-05 LAB — CREATININE, SERUM
Creatinine, Ser: 0.53 mg/dL (ref 0.50–1.10)
GFR calc Af Amer: >90 mL/min (ref >90)
GFR, EST NON AFRICAN AMERICAN: 89 mL/min — AB (ref >90)

## 2015-02-05 LAB — CULTURE, ROUTINE-ABSCESS

## 2015-02-05 MED ORDER — AMOXICILLIN-POT CLAVULANATE 875-125 MG PO TABS: 1.0000 | ORAL_TABLET | Freq: Two times a day (BID) | ORAL | Status: DC

## 2015-02-05 MED ORDER — SACCHAROMYCES BOULARDII 250 MG PO CAPS: 250.0000 mg | ORAL_CAPSULE | Freq: Two times a day (BID) | ORAL | Status: DC

## 2015-02-05 MED ORDER — OXYCODONE-ACETAMINOPHEN 5-325 MG PO TABS: 1.0000 | ORAL_TABLET | ORAL | Status: DC | PRN

## 2015-02-05 MED ORDER — BOOST / RESOURCE BREEZE PO LIQD: 1.0000 | Freq: Two times a day (BID) | ORAL | Status: DC

## 2015-02-05 MED ORDER — PRO-STAT SUGAR FREE PO LIQD: 30.0000 mL | Freq: Two times a day (BID) | ORAL | Status: DC

## 2015-02-05 NOTE — Progress Notes (Signed)
NURSING PROGRESS NOTE  [name] [id number] Discharge Data: 02/05/2015 2:39 PM Attending Provider: Nolon Nations, MD BLT:JQZE,SPQZR B., MD   Michele Palmer to be D/C'd Home per MD order.    All IV's will be discontinued and monitored for bleeding.  All belongings will be returned to patient for patient to take home.  Instructed family member how to flush JP drain, No questions. Expressed full understanding on how to flush drain and care for it.   Last Documented Vital Signs:  Blood pressure 122/63, pulse 70, temperature 98.8 F (37.1 C), temperature source Oral, resp. rate 15, height 5\' 3"  (1.6 m), weight 90.583 kg (199 lb 11.2 oz), SpO2 94 %.  Joslyn Hy, MSN, RN, [employer]

## 2015-02-05 NOTE — Progress Notes (Signed)
CARE MANAGEMENT NOTE 02/05/2015  Patient:  Michele Palmer, Michele Palmer   Account Number:  [id number]  Date Initiated:  01/19/2015  Documentation initiated by:  Tomi Bamberger  Subjective/Objective Assessment:   dx divertucilar abscess  admit- from home.     Action/Plan:   pt /ot eval- HHPT, HHOT   Anticipated DC Date:  02/05/2015   Anticipated DC Plan:  [name]  In-house referral  Clinical Social Worker      DC Forensic scientist  CM consult      [facility] Choice  HOME HEALTH  DURABLE MEDICAL EQUIPMENT   Choice offered to / List presented to:  C-1 Patient   DME arranged  3-N-1  [name]      DME agency  [name] arranged  HH-1 RN  [name]   Status of service:  Completed, signed off Medicare Important Message given?  YES (If response is "NO", the following Medicare IM given date fields will be blank) Date Medicare IM given:  01/18/2015 Medicare IM given by:  Tomi Bamberger Date Additional Medicare IM given:  02/05/2015 Additional Medicare IM given by:  Lorne Skeens  Discharge Disposition:  [name]  Per UR Regulation:  Reviewed for med. necessity/level of care/duration of stay   Comments:  02/05/15 Spoke with patient and daughter in law and confirmed that [facility] wants [facility], PT and OT. Patient stated that she will have family with her at home to help her with colostomy and drain care.Contacted [facility] and verified patient is set up for HHRN,HHPT and [place]. Rolling walker and 3N1 have already been delivered to patient's room by [facility].

## 2015-02-05 NOTE — Consult Note (Signed)
WOC ostomy follow up Stoma type/location: LLQ, diverting loop colostomy Ostomy pouching: 1pc flexible convex with 2" barrier ring around stoma prior to pouch application and belt. Education provided: daughter in law at bedside this am and is comfortable with pouching.  2 pouches and 5 barrier rings home with pt.  (WOC has requested more be overnighted from [place]) and I have contacted the home care agency to notify them of her needs.  This pouch is fairly new product so I am trying to make sure she has access for the supplies.  Enrolled patient in [place] Start Discharge program: Yes  WOC will follow along with you for continued support with ostomy teaching and care [facility] RN,CWOCN [phone]

## 2015-02-07 ENCOUNTER — Telehealth (INDEPENDENT_AMBULATORY_CARE_PROVIDER_SITE_OTHER): Payer: Self-pay | Admitting: General Surgery

## 2015-02-07 NOTE — Telephone Encounter (Signed)
Pt called stating color of drainage from abscess drain changed from tan color to dark brown.  No changes in pt's pain level, no fevers.  No tachycardia.  Reassured pt this was most likely old hematoma.  Pt instructed to call the office for bright red blood or significant increase in amount emptied.

## 2015-02-08 LAB — ANAEROBIC CULTURE

## 2015-02-22 ENCOUNTER — Other Ambulatory Visit (HOSPITAL_COMMUNITY): Payer: Self-pay | Admitting: Interventional Radiology

## 2015-02-22 DIAGNOSIS — K5780 Diverticulitis of intestine, part unspecified, with perforation and abscess without bleeding: Secondary | ICD-10-CM

## 2015-02-23 ENCOUNTER — Encounter (HOSPITAL_COMMUNITY): Payer: Self-pay

## 2015-02-23 ENCOUNTER — Ambulatory Visit (HOSPITAL_COMMUNITY)
Admission: RE | Admit: 2015-02-23 | Discharge: 2015-02-23 | Disposition: A | Discharge status: Home / Self Care | Payer: Medicare Other | Source: Ambulatory Visit | Attending: Interventional Radiology | Admitting: Interventional Radiology

## 2015-02-23 DIAGNOSIS — K5780 Diverticulitis of intestine, part unspecified, with perforation and abscess without bleeding: Secondary | ICD-10-CM | POA: Diagnosis not present

## 2015-02-23 MED ORDER — IOHEXOL 300 MG/ML  SOLN
80.0000 mL | Freq: Once | INTRAMUSCULAR | Status: AC | PRN
  Administered 2015-02-23: 80 mL via INTRAVENOUS

## 2015-02-23 NOTE — Procedures (Signed)
CT shows drain in good position and small residual abscess remains Continue daily flushing and followup Ct in 2 weeks

## 2015-02-25 ENCOUNTER — Other Ambulatory Visit: Payer: Self-pay | Admitting: *Deleted

## 2015-02-25 DIAGNOSIS — C50511 Malignant neoplasm of lower-outer quadrant of right female breast: Secondary | ICD-10-CM

## 2015-02-26 ENCOUNTER — Telehealth: Payer: Self-pay | Admitting: Hematology and Oncology

## 2015-02-26 NOTE — Telephone Encounter (Signed)
Patient called in to reschedule her appointment °

## 2015-03-01 ENCOUNTER — Telehealth: Payer: Self-pay | Admitting: Hematology and Oncology

## 2015-03-01 ENCOUNTER — Other Ambulatory Visit: Payer: Medicare Other

## 2015-03-01 ENCOUNTER — Ambulatory Visit: Payer: Medicare Other | Admitting: Hematology and Oncology

## 2015-03-01 ENCOUNTER — Other Ambulatory Visit: Payer: Self-pay

## 2015-03-01 NOTE — Telephone Encounter (Signed)
Appointments cancelled per patient voicemail   anne

## 2015-03-02 ENCOUNTER — Other Ambulatory Visit: Payer: Self-pay | Admitting: General Surgery

## 2015-03-02 ENCOUNTER — Other Ambulatory Visit: Payer: Medicare Other

## 2015-03-02 ENCOUNTER — Ambulatory Visit: Payer: Medicare Other | Admitting: Hematology and Oncology

## 2015-03-02 DIAGNOSIS — K5720 Diverticulitis of large intestine with perforation and abscess without bleeding: Secondary | ICD-10-CM

## 2015-03-05 ENCOUNTER — Other Ambulatory Visit (HOSPITAL_COMMUNITY): Payer: Self-pay | Admitting: Interventional Radiology

## 2015-03-05 DIAGNOSIS — N739 Female pelvic inflammatory disease, unspecified: Secondary | ICD-10-CM

## 2015-03-09 ENCOUNTER — Encounter (HOSPITAL_COMMUNITY): Payer: Self-pay

## 2015-03-09 ENCOUNTER — Ambulatory Visit (HOSPITAL_COMMUNITY)
Admission: RE | Admit: 2015-03-09 | Discharge: 2015-03-09 | Disposition: A | Discharge status: Home / Self Care | Payer: Medicare Other | Source: Ambulatory Visit | Attending: General Surgery | Admitting: General Surgery

## 2015-03-09 ENCOUNTER — Ambulatory Visit (HOSPITAL_COMMUNITY)
Admission: RE | Admit: 2015-03-09 | Discharge: 2015-03-09 | Disposition: A | Discharge status: Home / Self Care | Payer: Medicare Other | Source: Ambulatory Visit | Attending: Interventional Radiology | Admitting: Interventional Radiology

## 2015-03-09 ENCOUNTER — Ambulatory Visit (HOSPITAL_COMMUNITY): Admission: RE | Admit: 2015-03-09 | Payer: Medicare Other | Source: Ambulatory Visit

## 2015-03-09 DIAGNOSIS — I700 Atherosclerosis of aorta: Secondary | ICD-10-CM | POA: Insufficient documentation

## 2015-03-09 DIAGNOSIS — Z4889 Encounter for other specified surgical aftercare: Secondary | ICD-10-CM | POA: Insufficient documentation

## 2015-03-09 DIAGNOSIS — N9489 Other specified conditions associated with female genital organs and menstrual cycle: Secondary | ICD-10-CM | POA: Diagnosis not present

## 2015-03-09 DIAGNOSIS — I517 Cardiomegaly: Secondary | ICD-10-CM | POA: Diagnosis present

## 2015-03-09 DIAGNOSIS — I2510 Atherosclerotic heart disease of native coronary artery without angina pectoris: Secondary | ICD-10-CM | POA: Insufficient documentation

## 2015-03-09 DIAGNOSIS — K5780 Diverticulitis of intestine, part unspecified, with perforation and abscess without bleeding: Secondary | ICD-10-CM | POA: Diagnosis not present

## 2015-03-09 DIAGNOSIS — N739 Female pelvic inflammatory disease, unspecified: Secondary | ICD-10-CM

## 2015-03-09 MED ORDER — IOHEXOL 300 MG/ML  SOLN
50.0000 mL | Freq: Once | INTRAMUSCULAR | Status: AC | PRN
  Administered 2015-03-09: 10 mL

## 2015-03-09 MED ORDER — IOHEXOL 300 MG/ML  SOLN
80.0000 mL | Freq: Once | INTRAMUSCULAR | Status: AC | PRN
  Administered 2015-03-09: 80 mL via INTRAVENOUS

## 2015-03-19 ENCOUNTER — Ambulatory Visit: Payer: Self-pay | Admitting: General Surgery

## 2015-03-19 ENCOUNTER — Other Ambulatory Visit: Payer: Self-pay | Admitting: Urology

## 2015-03-23 NOTE — Pre-Procedure Instructions (Signed)
Michele Palmer  03/23/2015   Your procedure is scheduled on:  Friday March 26, 2015 at 10:30 AM.  Report to [facility] Admitting at 8:30 AM.  Call this number if you have problems the morning of surgery: [phone]   Remember:   Do not eat food or drink liquids after midnight.   Take these medicines the morning of surgery with A SIP OF WATER: Acetaminophen (Tylenol) if needed, Anastrozole (Arimidex), and Ranitidine (Zantac)   Please stop taking any vitamins, or herbal medications   Please follow bowel prep as ordered by your Physician   Do not wear jewelry, make-up or nail polish.  Do not wear lotions, powders, or perfumes.   Do not shave 48 hours prior to surgery.  Do not bring valuables to the hospital.  [facility] is not responsible for any belongings or valuables.               Contacts, dentures or bridgework may not be worn into surgery.  Leave suitcase in the car. After surgery it may be brought to your room.  For patients admitted to the hospital, discharge time is determined by your treatment team.               Patients discharged the day of surgery will not be allowed to drive home.  Name and phone number of your driver:   Special Instructions: Shower using CHG soap the night before and the morning of your surgery   Please read over the following fact sheets that you were given: Pain Booklet, Coughing and Deep Breathing, Blood Transfusion Information, MRSA Information and Surgical Site Infection Prevention

## 2015-03-24 ENCOUNTER — Encounter (HOSPITAL_COMMUNITY)
Admission: RE | Admit: 2015-03-24 | Discharge: 2015-03-24 | Disposition: A | Discharge status: Home / Self Care | Payer: Medicare Other | Source: Ambulatory Visit | Attending: General Surgery | Admitting: General Surgery

## 2015-03-24 ENCOUNTER — Encounter (HOSPITAL_COMMUNITY): Payer: Self-pay

## 2015-03-24 ENCOUNTER — Other Ambulatory Visit (HOSPITAL_COMMUNITY): Payer: Medicare Other

## 2015-03-24 DIAGNOSIS — Z4689 Encounter for fitting and adjustment of other specified devices: Secondary | ICD-10-CM

## 2015-03-24 DIAGNOSIS — Z01812 Encounter for preprocedural laboratory examination: Secondary | ICD-10-CM

## 2015-03-24 HISTORY — DX: Vesicointestinal fistula: N32.1

## 2015-03-24 LAB — PREPARE RBC (CROSSMATCH)

## 2015-03-24 LAB — CBC WITH DIFFERENTIAL/PLATELET
BASOS ABS: 0.1 10*3/uL (ref 0.0–0.1)
Basophils Relative: 1 % (ref 0–1)
EOS ABS: 0.2 10*3/uL (ref 0.0–0.7)
EOS PCT: 2 % (ref 0–5)
HEMATOCRIT: 38.7 % (ref 36.0–46.0)
HEMOGLOBIN: 12.4 g/dL (ref 12.0–15.0)
LYMPHS PCT: 37 % (ref 12–46)
Lymphs Abs: 3 10*3/uL (ref 0.7–4.0)
MCH: 29.7 pg (ref 26.0–34.0)
MCHC: 32 g/dL (ref 30.0–36.0)
MCV: 92.8 fL (ref 78.0–100.0)
Monocytes Absolute: 0.6 10*3/uL (ref 0.1–1.0)
Monocytes Relative: 8 % (ref 3–12)
Neutro Abs: 4.4 10*3/uL (ref 1.7–7.7)
Neutrophils Relative %: 52 % (ref 43–77)
PLATELETS: 280 10*3/uL (ref 150–400)
RBC: 4.17 MIL/uL (ref 3.87–5.11)
RDW: 14.6 % (ref 11.5–15.5)
WBC: 8.3 10*3/uL (ref 4.0–10.5)

## 2015-03-24 LAB — COMPREHENSIVE METABOLIC PANEL
ALBUMIN: 3 g/dL — AB (ref 3.5–5.2)
ALK PHOS: 96 U/L (ref 39–117)
ALT: 12 U/L (ref 0–35)
AST: 18 U/L (ref 0–37)
Anion gap: 8 (ref 5–15)
BUN: 18 mg/dL (ref 6–23)
CO2: 25 mmol/L (ref 19–32)
Calcium: 9.3 mg/dL (ref 8.4–10.5)
Chloride: 100 mmol/L (ref 96–112)
Creatinine, Ser: 0.67 mg/dL (ref 0.50–1.10)
GFR calc Af Amer: >90 mL/min (ref >90)
GFR calc non Af Amer: 83 mL/min — ABNORMAL LOW (ref >90)
Glucose, Bld: 130 mg/dL — ABNORMAL HIGH (ref 70–99)
POTASSIUM: 3.9 mmol/L (ref 3.5–5.1)
Sodium: 133 mmol/L — ABNORMAL LOW (ref 135–145)
Total Bilirubin: 0.5 mg/dL (ref 0.3–1.2)
Total Protein: 7.2 g/dL (ref 6.0–8.3)

## 2015-03-24 LAB — ABO/RH: ABO/RH(D): A POS

## 2015-03-24 MED ORDER — CHLORHEXIDINE GLUCONATE CLOTH 2 % EX PADS: 6.0000 | MEDICATED_PAD | Freq: Once | CUTANEOUS | Status: DC

## 2015-03-24 NOTE — Progress Notes (Signed)
PCP is Dhruv Vyas. Patient denied having any acute cardiac or pulmonary issues.   Daughter at chairside during PAT visit. All questions answered.

## 2015-03-25 MED ORDER — ALVIMOPAN 12 MG PO CAPS
12.0000 mg | ORAL_CAPSULE | ORAL | Status: AC
  Administered 2015-03-26: 12 mg via ORAL
  Filled 2015-03-25: qty 1

## 2015-03-26 ENCOUNTER — Encounter (HOSPITAL_COMMUNITY): Payer: Self-pay | Admitting: Anesthesiology

## 2015-03-26 ENCOUNTER — Inpatient Hospital Stay (HOSPITAL_COMMUNITY): Payer: Medicare Other | Admitting: Anesthesiology

## 2015-03-26 ENCOUNTER — Encounter (HOSPITAL_COMMUNITY)
Admission: RE | Disposition: A | Discharge status: Home Health | Payer: Self-pay | Source: Ambulatory Visit | Attending: General Surgery

## 2015-03-26 ENCOUNTER — Inpatient Hospital Stay (HOSPITAL_COMMUNITY)
Admission: RE | Admit: 2015-03-26 | Discharge: 2015-04-03 | DRG: 330 | Disposition: A | Discharge status: Home Health | Payer: Medicare Other | Source: Ambulatory Visit | Attending: General Surgery | Admitting: General Surgery

## 2015-03-26 ENCOUNTER — Inpatient Hospital Stay (HOSPITAL_COMMUNITY): Payer: Medicare Other

## 2015-03-26 DIAGNOSIS — I10 Essential (primary) hypertension: Secondary | ICD-10-CM | POA: Diagnosis present

## 2015-03-26 DIAGNOSIS — K5720 Diverticulitis of large intestine with perforation and abscess without bleeding: Secondary | ICD-10-CM | POA: Diagnosis present

## 2015-03-26 DIAGNOSIS — C50911 Malignant neoplasm of unspecified site of right female breast: Secondary | ICD-10-CM | POA: Diagnosis present

## 2015-03-26 DIAGNOSIS — Z8041 Family history of malignant neoplasm of ovary: Secondary | ICD-10-CM | POA: Diagnosis not present

## 2015-03-26 DIAGNOSIS — M1990 Unspecified osteoarthritis, unspecified site: Secondary | ICD-10-CM | POA: Diagnosis present

## 2015-03-26 DIAGNOSIS — K219 Gastro-esophageal reflux disease without esophagitis: Secondary | ICD-10-CM | POA: Diagnosis present

## 2015-03-26 DIAGNOSIS — F329 Major depressive disorder, single episode, unspecified: Secondary | ICD-10-CM | POA: Diagnosis present

## 2015-03-26 DIAGNOSIS — Z79811 Long term (current) use of aromatase inhibitors: Secondary | ICD-10-CM | POA: Diagnosis not present

## 2015-03-26 DIAGNOSIS — N321 Vesicointestinal fistula: Secondary | ICD-10-CM | POA: Diagnosis present

## 2015-03-26 DIAGNOSIS — Z7952 Long term (current) use of systemic steroids: Secondary | ICD-10-CM | POA: Diagnosis not present

## 2015-03-26 DIAGNOSIS — F419 Anxiety disorder, unspecified: Secondary | ICD-10-CM | POA: Diagnosis present

## 2015-03-26 DIAGNOSIS — Z933 Colostomy status: Secondary | ICD-10-CM | POA: Diagnosis not present

## 2015-03-26 DIAGNOSIS — E119 Type 2 diabetes mellitus without complications: Secondary | ICD-10-CM | POA: Diagnosis present

## 2015-03-26 DIAGNOSIS — Z823 Family history of stroke: Secondary | ICD-10-CM

## 2015-03-26 DIAGNOSIS — Z833 Family history of diabetes mellitus: Secondary | ICD-10-CM

## 2015-03-26 DIAGNOSIS — K5732 Diverticulitis of large intestine without perforation or abscess without bleeding: Secondary | ICD-10-CM | POA: Diagnosis present

## 2015-03-26 HISTORY — PX: CYSTOSCOPY WITH STENT PLACEMENT: SHX5790

## 2015-03-26 HISTORY — PX: PARTIAL COLECTOMY: SHX5273

## 2015-03-26 LAB — MRSA PCR SCREENING: MRSA by PCR: NEGATIVE

## 2015-03-26 LAB — GLUCOSE, CAPILLARY
GLUCOSE-CAPILLARY: 121 mg/dL — AB (ref 70–99)
Glucose-Capillary: 164 mg/dL — ABNORMAL HIGH (ref 70–99)
Glucose-Capillary: 211 mg/dL — ABNORMAL HIGH (ref 70–99)

## 2015-03-26 LAB — POCT I-STAT 4, (NA,K, GLUC, HGB,HCT)
Glucose, Bld: 142 mg/dL — ABNORMAL HIGH (ref 70–99)
HCT: 30 % — ABNORMAL LOW (ref 36.0–46.0)
Hemoglobin: 10.2 g/dL — ABNORMAL LOW (ref 12.0–15.0)
Potassium: 3.1 mmol/L — ABNORMAL LOW (ref 3.5–5.1)
Sodium: 141 mmol/L (ref 135–145)

## 2015-03-26 LAB — CBC
HCT: 37.3 % (ref 36.0–46.0)
HEMOGLOBIN: 11.9 g/dL — AB (ref 12.0–15.0)
MCH: 29.9 pg (ref 26.0–34.0)
MCHC: 31.9 g/dL (ref 30.0–36.0)
MCV: 93.7 fL (ref 78.0–100.0)
PLATELETS: 240 10*3/uL (ref 150–400)
RBC: 3.98 MIL/uL (ref 3.87–5.11)
RDW: 14.2 % (ref 11.5–15.5)
WBC: 16.2 10*3/uL — AB (ref 4.0–10.5)

## 2015-03-26 SURGERY — COLECTOMY, PARTIAL
Anesthesia: General | Site: Ureter

## 2015-03-26 MED ORDER — NALOXONE HCL 0.4 MG/ML IJ SOLN: 0.4000 mg | INTRAMUSCULAR | Status: DC | PRN

## 2015-03-26 MED ORDER — HYDROMORPHONE 0.3 MG/ML IV SOLN
INTRAVENOUS | Status: DC
  Administered 2015-03-26: 18:00:00 via INTRAVENOUS
  Administered 2015-03-26: 1.19 mg via INTRAVENOUS
  Administered 2015-03-27: 1.79 mg via INTRAVENOUS
  Administered 2015-03-27: 0.999 mg via INTRAVENOUS
  Administered 2015-03-27: 1.99 mg via INTRAVENOUS
  Administered 2015-03-27: 1.79 mg via INTRAVENOUS
  Administered 2015-03-27: 1.59 mg via INTRAVENOUS
  Administered 2015-03-27: 1.79 mg via INTRAVENOUS
  Administered 2015-03-28: 2.99 mg via INTRAVENOUS
  Administered 2015-03-28: 0.999 mg via INTRAVENOUS
  Administered 2015-03-28: 1.59 mg via INTRAVENOUS
  Administered 2015-03-28: 1.79 mg via INTRAVENOUS
  Administered 2015-03-28: 0.399 mg via INTRAVENOUS
  Administered 2015-03-28: 2.99 mg via INTRAVENOUS
  Administered 2015-03-28: 05:00:00 via INTRAVENOUS
  Administered 2015-03-29: 1.79 mg via INTRAVENOUS
  Administered 2015-03-29: 0.999 mg via INTRAVENOUS
  Administered 2015-03-29: 0.4 mg via INTRAVENOUS
  Administered 2015-03-29: 0.599 mg via INTRAVENOUS
  Administered 2015-03-29: 0.8 mg via INTRAVENOUS
  Administered 2015-03-29: 1.19 mg via INTRAVENOUS
  Administered 2015-03-30: 0.99 mg via INTRAVENOUS
  Administered 2015-03-30: 0.3 mg via INTRAVENOUS
  Administered 2015-03-30: 1.39 mg via INTRAVENOUS
  Administered 2015-03-30: 0.6 mg via INTRAVENOUS
  Administered 2015-03-30: 2.79 mg via INTRAVENOUS
  Administered 2015-03-30: 1 mg via INTRAVENOUS
  Administered 2015-03-31: 1.19 mg via INTRAVENOUS
  Administered 2015-03-31: 0.6 mg via INTRAVENOUS
  Administered 2015-03-31: 0.7999 mg via INTRAVENOUS
  Filled 2015-03-26 (×5): qty 25

## 2015-03-26 MED ORDER — ONDANSETRON HCL 4 MG/2ML IJ SOLN
INTRAMUSCULAR | Status: AC
  Filled 2015-03-26: qty 2

## 2015-03-26 MED ORDER — STERILE WATER FOR IRRIGATION IR SOLN
Status: DC | PRN
  Administered 2015-03-26: 3000 mL

## 2015-03-26 MED ORDER — MIDAZOLAM HCL 5 MG/5ML IJ SOLN
INTRAMUSCULAR | Status: DC | PRN
  Administered 2015-03-26 (×2): 1 mg via INTRAVENOUS

## 2015-03-26 MED ORDER — FLEET ENEMA 7-19 GM/118ML RE ENEM: 1.0000 | ENEMA | Freq: Once | RECTAL | Status: DC

## 2015-03-26 MED ORDER — LIDOCAINE HCL (CARDIAC) 20 MG/ML IV SOLN
INTRAVENOUS | Status: AC
  Filled 2015-03-26: qty 5

## 2015-03-26 MED ORDER — SODIUM CHLORIDE 0.9 % IR SOLN
Status: DC | PRN
  Administered 2015-03-26 (×3): 1000 mL
  Administered 2015-03-26: 1

## 2015-03-26 MED ORDER — ONDANSETRON HCL 4 MG/2ML IJ SOLN
4.0000 mg | Freq: Four times a day (QID) | INTRAMUSCULAR | Status: DC | PRN
  Administered 2015-03-30 – 2015-03-31 (×2): 4 mg via INTRAVENOUS
  Filled 2015-03-26 (×2): qty 2

## 2015-03-26 MED ORDER — MIDAZOLAM HCL 2 MG/2ML IJ SOLN
INTRAMUSCULAR | Status: AC
  Filled 2015-03-26: qty 2

## 2015-03-26 MED ORDER — METHYLENE BLUE 1 % INJ SOLN
INTRAMUSCULAR | Status: AC
  Filled 2015-03-26: qty 10

## 2015-03-26 MED ORDER — CETYLPYRIDINIUM CHLORIDE 0.05 % MT LIQD
7.0000 mL | Freq: Two times a day (BID) | OROMUCOSAL | Status: DC
  Administered 2015-03-27 – 2015-03-30 (×8): 7 mL via OROMUCOSAL

## 2015-03-26 MED ORDER — HYDROMORPHONE 0.3 MG/ML IV SOLN
INTRAVENOUS | Status: AC
  Filled 2015-03-26: qty 25

## 2015-03-26 MED ORDER — IOHEXOL 350 MG/ML SOLN: INTRAVENOUS | Status: DC | PRN

## 2015-03-26 MED ORDER — ROCURONIUM BROMIDE 100 MG/10ML IV SOLN
INTRAVENOUS | Status: DC | PRN
  Administered 2015-03-26: 10 mg via INTRAVENOUS
  Administered 2015-03-26: 50 mg via INTRAVENOUS
  Administered 2015-03-26: 15 mg via INTRAVENOUS
  Administered 2015-03-26: 10 mg via INTRAVENOUS

## 2015-03-26 MED ORDER — IOHEXOL 300 MG/ML  SOLN
INTRAMUSCULAR | Status: DC | PRN
  Administered 2015-03-26: 50 mL via INTRAVENOUS

## 2015-03-26 MED ORDER — FENTANYL CITRATE (PF) 100 MCG/2ML IJ SOLN
INTRAMUSCULAR | Status: DC | PRN
  Administered 2015-03-26 (×2): 50 ug via INTRAVENOUS
  Administered 2015-03-26: 100 ug via INTRAVENOUS

## 2015-03-26 MED ORDER — ONDANSETRON HCL 4 MG/2ML IJ SOLN
INTRAMUSCULAR | Status: DC | PRN
  Administered 2015-03-26: 4 mg via INTRAVENOUS

## 2015-03-26 MED ORDER — DEXTROSE 5 % IV SOLN
1.0000 g | Freq: Two times a day (BID) | INTRAVENOUS | Status: AC
  Administered 2015-03-26 – 2015-03-27 (×2): 1 g via INTRAVENOUS
  Filled 2015-03-26 (×3): qty 1

## 2015-03-26 MED ORDER — SODIUM CHLORIDE 0.9 % IV SOLN
500.0000 mL | Freq: Once | INTRAVENOUS | Status: AC
Start: 2015-03-26 — End: 2015-03-26
  Administered 2015-03-26: 500 mL via INTRAVENOUS

## 2015-03-26 MED ORDER — PHENYLEPHRINE HCL 10 MG/ML IJ SOLN
10.0000 mg | INTRAMUSCULAR | Status: DC | PRN
  Administered 2015-03-26: 40 ug/min via INTRAVENOUS

## 2015-03-26 MED ORDER — LIDOCAINE HCL (CARDIAC) 20 MG/ML IV SOLN
INTRAVENOUS | Status: DC | PRN
  Administered 2015-03-26: 40 mg via INTRAVENOUS

## 2015-03-26 MED ORDER — ALBUMIN HUMAN 5 % IV SOLN
INTRAVENOUS | Status: DC | PRN
  Administered 2015-03-26 (×3): via INTRAVENOUS

## 2015-03-26 MED ORDER — CHLORHEXIDINE GLUCONATE 0.12 % MT SOLN
15.0000 mL | Freq: Two times a day (BID) | OROMUCOSAL | Status: DC
  Administered 2015-03-27 – 2015-03-31 (×9): 15 mL via OROMUCOSAL
  Filled 2015-03-26 (×11): qty 15

## 2015-03-26 MED ORDER — PROPOFOL 10 MG/ML IV BOLUS
INTRAVENOUS | Status: DC | PRN
  Administered 2015-03-26: 80 mg via INTRAVENOUS

## 2015-03-26 MED ORDER — METHYLENE BLUE 1 % INJ SOLN
INTRAMUSCULAR | Status: DC | PRN
  Administered 2015-03-26: 10 mL via SUBMUCOSAL

## 2015-03-26 MED ORDER — ENOXAPARIN SODIUM 40 MG/0.4ML ~~LOC~~ SOLN
40.0000 mg | SUBCUTANEOUS | Status: DC
  Administered 2015-03-27 – 2015-04-02 (×7): 40 mg via SUBCUTANEOUS
  Filled 2015-03-26 (×7): qty 0.4

## 2015-03-26 MED ORDER — LACTATED RINGERS IV SOLN
INTRAVENOUS | Status: DC | PRN
  Administered 2015-03-26 (×2): via INTRAVENOUS

## 2015-03-26 MED ORDER — PHENYLEPHRINE HCL 10 MG/ML IJ SOLN
INTRAMUSCULAR | Status: DC | PRN
  Administered 2015-03-26: 80 ug via INTRAVENOUS
  Administered 2015-03-26: 120 ug via INTRAVENOUS
  Administered 2015-03-26 (×5): 80 ug via INTRAVENOUS

## 2015-03-26 MED ORDER — HYDROMORPHONE HCL 1 MG/ML IJ SOLN
INTRAMUSCULAR | Status: AC
  Administered 2015-03-26: 0.5 mg via INTRAVENOUS
  Filled 2015-03-26: qty 1

## 2015-03-26 MED ORDER — LACTATED RINGERS IV SOLN
INTRAVENOUS | Status: DC
  Administered 2015-03-26: 10:00:00 via INTRAVENOUS

## 2015-03-26 MED ORDER — POVIDONE-IODINE 10 % EX OINT
TOPICAL_OINTMENT | CUTANEOUS | Status: DC | PRN
  Administered 2015-03-26: 1 via TOPICAL

## 2015-03-26 MED ORDER — DEXTROSE 5 % IV SOLN
2.0000 g | INTRAVENOUS | Status: AC
  Administered 2015-03-26: 2 g via INTRAVENOUS
  Filled 2015-03-26 (×2): qty 2

## 2015-03-26 MED ORDER — GLYCOPYRROLATE 0.2 MG/ML IJ SOLN
INTRAMUSCULAR | Status: DC | PRN
  Administered 2015-03-26: .8 mg via INTRAVENOUS

## 2015-03-26 MED ORDER — PEG 3350-KCL-NA BICARB-NACL 420 G PO SOLR: 4000.0000 mL | Freq: Once | ORAL | Status: DC

## 2015-03-26 MED ORDER — NEOMYCIN SULFATE 500 MG PO TABS: 1000.0000 mg | ORAL_TABLET | ORAL | Status: DC

## 2015-03-26 MED ORDER — FENTANYL CITRATE (PF) 250 MCG/5ML IJ SOLN
INTRAMUSCULAR | Status: AC
  Filled 2015-03-26: qty 5

## 2015-03-26 MED ORDER — PROPOFOL 10 MG/ML IV BOLUS
INTRAVENOUS | Status: AC
  Filled 2015-03-26: qty 20

## 2015-03-26 MED ORDER — SODIUM CHLORIDE 0.9 % IJ SOLN: 9.0000 mL | INTRAMUSCULAR | Status: DC | PRN

## 2015-03-26 MED ORDER — KCL IN DEXTROSE-NACL 20-5-0.45 MEQ/L-%-% IV SOLN
INTRAVENOUS | Status: DC
  Administered 2015-03-26 – 2015-03-27 (×2): via INTRAVENOUS
  Filled 2015-03-26 (×4): qty 1000

## 2015-03-26 MED ORDER — NEOSTIGMINE METHYLSULFATE 10 MG/10ML IV SOLN
INTRAVENOUS | Status: DC | PRN
  Administered 2015-03-26: 5 mg via INTRAVENOUS

## 2015-03-26 MED ORDER — DEXAMETHASONE SODIUM PHOSPHATE 4 MG/ML IJ SOLN
INTRAMUSCULAR | Status: AC
  Filled 2015-03-26: qty 2

## 2015-03-26 MED ORDER — DIPHENHYDRAMINE HCL 50 MG/ML IJ SOLN: 12.5000 mg | Freq: Four times a day (QID) | INTRAMUSCULAR | Status: DC | PRN

## 2015-03-26 MED ORDER — ERYTHROMYCIN BASE 250 MG PO TABS: 1000.0000 mg | ORAL_TABLET | ORAL | Status: DC

## 2015-03-26 MED ORDER — ROCURONIUM BROMIDE 50 MG/5ML IV SOLN
INTRAVENOUS | Status: AC
  Filled 2015-03-26: qty 1

## 2015-03-26 MED ORDER — DIPHENHYDRAMINE HCL 12.5 MG/5ML PO ELIX
12.5000 mg | ORAL_SOLUTION | Freq: Four times a day (QID) | ORAL | Status: DC | PRN
  Filled 2015-03-26: qty 5

## 2015-03-26 MED ORDER — HYDROMORPHONE HCL 1 MG/ML IJ SOLN
0.2500 mg | INTRAMUSCULAR | Status: DC | PRN
  Administered 2015-03-26 (×2): 0.5 mg via INTRAVENOUS

## 2015-03-26 SURGICAL SUPPLY — 96 items
ADAPTER CATH URET PLST 4-6FR (CATHETERS) ×4 IMPLANT
BAG DRAIN URO-CYSTO SKYTR STRL (DRAIN) ×4 IMPLANT
BAG URINE DRAINAGE (UROLOGICAL SUPPLIES) ×4 IMPLANT
BLADE 10 SAFETY STRL DISP (BLADE) ×4 IMPLANT
BLADE SURG ROTATE 9660 (MISCELLANEOUS) IMPLANT
CANISTER SUCT LVC 12 LTR MEDI- (MISCELLANEOUS) ×4 IMPLANT
CANISTER SUCTION 2500CC (MISCELLANEOUS) ×4 IMPLANT
CATH FOLEY 2WAY SLVR  5CC 20FR (CATHETERS) ×1
CATH FOLEY 2WAY SLVR 5CC 20FR (CATHETERS) ×3 IMPLANT
CATH INTERMIT  6FR 70CM (CATHETERS) ×4 IMPLANT
CATH URET 5FR 28IN CONE TIP (BALLOONS)
CATH URET 5FR 70CM CONE TIP (BALLOONS) IMPLANT
CHLORAPREP W/TINT 26ML (MISCELLANEOUS) ×4 IMPLANT
COVER MAYO STAND STRL (DRAPES) ×8 IMPLANT
COVER SURGICAL LIGHT HANDLE (MISCELLANEOUS) ×12 IMPLANT
DRAIN CHANNEL 19F RND (DRAIN) ×4 IMPLANT
DRAPE CAMERA CLOSED 9X96 (DRAPES) IMPLANT
DRAPE LAPAROSCOPIC ABDOMINAL (DRAPES) ×8 IMPLANT
DRAPE PROXIMA HALF (DRAPES) ×8 IMPLANT
DRAPE UTILITY XL STRL (DRAPES) ×12 IMPLANT
DRAPE WARM FLUID 44X44 (DRAPE) ×4 IMPLANT
DRSG OPSITE POSTOP 4X10 (GAUZE/BANDAGES/DRESSINGS) ×4 IMPLANT
DRSG OPSITE POSTOP 4X8 (GAUZE/BANDAGES/DRESSINGS) IMPLANT
ELECT BLADE 6.5 EXT (BLADE) ×4 IMPLANT
ELECT CAUTERY BLADE 6.4 (BLADE) ×12 IMPLANT
ELECT REM PT RETURN 9FT ADLT (ELECTROSURGICAL) ×4
ELECTRODE REM PT RTRN 9FT ADLT (ELECTROSURGICAL) ×3 IMPLANT
EVACUATOR SILICONE 100CC (DRAIN) ×4 IMPLANT
GAUZE SPONGE 4X4 12PLY STRL (GAUZE/BANDAGES/DRESSINGS) ×4 IMPLANT
GAUZE SPONGE 4X4 16PLY XRAY LF (GAUZE/BANDAGES/DRESSINGS) ×4 IMPLANT
GLOVE BIO SURGEON STRL SZ7.5 (GLOVE) ×16 IMPLANT
GLOVE BIO SURGEON STRL SZ8 (GLOVE) ×12 IMPLANT
GLOVE BIOGEL PI IND STRL 6.5 (GLOVE) ×6 IMPLANT
GLOVE BIOGEL PI IND STRL 7.0 (GLOVE) ×9 IMPLANT
GLOVE BIOGEL PI IND STRL 8 (GLOVE) ×18 IMPLANT
GLOVE BIOGEL PI INDICATOR 6.5 (GLOVE) ×2
GLOVE BIOGEL PI INDICATOR 7.0 (GLOVE) ×3
GLOVE BIOGEL PI INDICATOR 8 (GLOVE) ×6
GLOVE ECLIPSE 7.5 STRL STRAW (GLOVE) ×8 IMPLANT
GLOVE SURG SS PI 6.5 STRL IVOR (GLOVE) ×4 IMPLANT
GOWN STRL REUS W/ TWL LRG LVL3 (GOWN DISPOSABLE) ×21 IMPLANT
GOWN STRL REUS W/ TWL XL LVL3 (GOWN DISPOSABLE) ×15 IMPLANT
GOWN STRL REUS W/TWL LRG LVL3 (GOWN DISPOSABLE) ×7
GOWN STRL REUS W/TWL XL LVL3 (GOWN DISPOSABLE) ×5
GUIDEWIRE ANG ZIPWIRE 038X150 (WIRE) IMPLANT
GUIDEWIRE STR DUAL SENSOR (WIRE) IMPLANT
GUIDEWIRE TEFLON GW 038X150 (WIRE) IMPLANT
IV NS IRRIG 3000ML ARTHROMATIC (IV SOLUTION) IMPLANT
KIT BASIN OR (CUSTOM PROCEDURE TRAY) ×4 IMPLANT
KIT OSTOMY DRAINABLE 2.75 STR (WOUND CARE) ×4 IMPLANT
KIT ROOM TURNOVER OR (KITS) ×8 IMPLANT
LEGGING LITHOTOMY PAIR STRL (DRAPES) ×4 IMPLANT
LIGASURE IMPACT 36 18CM CVD LR (INSTRUMENTS) ×4 IMPLANT
NS IRRIG 1000ML POUR BTL (IV SOLUTION) ×8 IMPLANT
OMNIPAQUE 300MG/ML ×4 IMPLANT
PACK CYSTOSCOPY (CUSTOM PROCEDURE TRAY) ×4 IMPLANT
PACK GENERAL/GYN (CUSTOM PROCEDURE TRAY) ×4 IMPLANT
PAD ARMBOARD 7.5X6 YLW CONV (MISCELLANEOUS) ×12 IMPLANT
PENCIL BUTTON HOLSTER BLD 10FT (ELECTRODE) ×4 IMPLANT
RELOAD PROXIMATE 75MM BLUE (ENDOMECHANICALS) ×4 IMPLANT
SPECIMEN JAR LARGE (MISCELLANEOUS) ×4 IMPLANT
SPECIMEN JAR MEDIUM (MISCELLANEOUS) ×4 IMPLANT
SPECIMEN JAR SMALL (MISCELLANEOUS) ×4 IMPLANT
SPECIMEN JAR X LARGE (MISCELLANEOUS) ×4 IMPLANT
SPONGE LAP 18X18 X RAY DECT (DISPOSABLE) ×20 IMPLANT
STAPLER CIRC CVD 29MM 37CM (STAPLE) ×4 IMPLANT
STAPLER CUT CVD 40MM BLUE (STAPLE) ×4 IMPLANT
STAPLER PROXIMATE 75MM BLUE (STAPLE) ×4 IMPLANT
STAPLER VISISTAT 35W (STAPLE) ×4 IMPLANT
SUCTION POOLE TIP (SUCTIONS) ×4 IMPLANT
SURGILUBE 2OZ TUBE FLIPTOP (MISCELLANEOUS) IMPLANT
SUT CHROMIC 2 0 SH (SUTURE) ×8 IMPLANT
SUT CHROMIC 2 0 UR 5 27 (SUTURE) ×8 IMPLANT
SUT ETHILON 2 0 FS 18 (SUTURE) ×4 IMPLANT
SUT PDS AB 1 TP1 96 (SUTURE) ×12 IMPLANT
SUT PROLENE 2 0 CT2 30 (SUTURE) ×4 IMPLANT
SUT PROLENE 2 0 KS (SUTURE) IMPLANT
SUT SILK 2 0 SH CR/8 (SUTURE) ×4 IMPLANT
SUT SILK 2 0 TIES 10X30 (SUTURE) ×8 IMPLANT
SUT SILK 3 0 SH CR/8 (SUTURE) ×4 IMPLANT
SUT SILK 3 0 TIES 10X30 (SUTURE) ×4 IMPLANT
SUT VIC AB 1 CT1 18XCR BRD 8 (SUTURE) ×3 IMPLANT
SUT VIC AB 1 CT1 8-18 (SUTURE) ×1
SUT VIC AB 3-0 SH 18 (SUTURE) ×4 IMPLANT
SUT VIC AB 3-0 SH 27 (SUTURE) ×1
SUT VIC AB 3-0 SH 27XBRD (SUTURE) ×3 IMPLANT
SYR BULB IRRIGATION 50ML (SYRINGE) ×4 IMPLANT
TAPE CLOTH SURG 4X10 WHT LF (GAUZE/BANDAGES/DRESSINGS) ×4 IMPLANT
TOWEL OR 17X26 10 PK STRL BLUE (TOWEL DISPOSABLE) ×8 IMPLANT
TRAY FOLEY CATH 14FRSI W/METER (CATHETERS) IMPLANT
TRAY PROCTOSCOPIC FIBER OPTIC (SET/KITS/TRAYS/PACK) ×4 IMPLANT
TUBE CONNECTING 12X1/4 (SUCTIONS) ×8 IMPLANT
UNDERPAD 30X30 INCONTINENT (UNDERPADS AND DIAPERS) ×8 IMPLANT
WATER STERILE IRR 1000ML POUR (IV SOLUTION) IMPLANT
WATER STERILE IRR 3000ML UROMA (IV SOLUTION) ×4 IMPLANT
YANKAUER SUCT BULB TIP NO VENT (SUCTIONS) ×4 IMPLANT

## 2015-03-26 NOTE — Anesthesia Preprocedure Evaluation (Signed)
Anesthesia Evaluation  Patient identified by MRN, date of birth, ID band Patient awake    Reviewed: Allergy & Precautions, H&P , NPO status , Patient's Chart, lab work & pertinent test results  Airway Mallampati: II  TM Distance: >3 FB Neck ROM: Full    Dental no notable dental hx. (+) Partial Upper, Dental Advisory Given   Pulmonary neg pulmonary ROS,  breath sounds clear to auscultation  Pulmonary exam normal       Cardiovascular hypertension, Pt. on medications Rhythm:Regular Rate:Normal     Neuro/Psych Anxiety Depression negative neurological ROS     GI/Hepatic Neg liver ROS, GERD-  Medicated and Controlled,  Endo/Other  negative endocrine ROS  Renal/GU negative Renal ROS  negative genitourinary   Musculoskeletal  (+) Arthritis -, Osteoarthritis,    Abdominal   Peds  Hematology negative hematology ROS (+)   Anesthesia Other Findings   Reproductive/Obstetrics negative OB ROS                             Anesthesia Physical Anesthesia Plan  ASA: II  Anesthesia Plan: General   Post-op Pain Management:    Induction: Intravenous  Airway Management Planned: Oral ETT  Additional Equipment:   Intra-op Plan:   Post-operative Plan: Extubation in OR  Informed Consent: I have reviewed the patients History and Physical, chart, labs and discussed the procedure including the risks, benefits and alternatives for the proposed anesthesia with the patient or authorized representative who has indicated his/her understanding and acceptance.   Dental advisory given  Plan Discussed with: CRNA  Anesthesia Plan Comments:         Anesthesia Quick Evaluation

## 2015-03-26 NOTE — Interval H&P Note (Signed)
History and Physical Interval Note: Needs prreoperative stents for ureters.  Spoke with Dr. Karsten Ro. 03/26/2015 11:55 AM  Michele Palmer  has presented today for surgery, with the diagnosis of diverticular perforation with abscess and fistula  The various methods of treatment have been discussed with the patient and family. After consideration of risks, benefits and other options for treatment, the patient has consented to  Procedure(s): COLECTOMY, BLADDER REPAIR, DIVERTING LOOP ILEOSTOMY, POSSIBLE COLOSTOMY (N/A) COLOVESICAL FISTULA REPAIR  (N/A) CYSTOSCOPY WITH STENT PLACEMENT (N/A) as a surgical intervention .  The patient's history has been reviewed, patient examined, no change in status, stable for surgery.  I have reviewed the patient's chart and labs.  Questions were answered to the patient's satisfaction.     [name]

## 2015-03-26 NOTE — Anesthesia Postprocedure Evaluation (Signed)
  Anesthesia Post-op Note  Patient: Michele Palmer  Procedure(s) Performed: Procedure(s): COLOSTOMY TAKEDOWN, PARTIAL COLECTOMY, BLADDER REPAIR, DIVERTING LOOP ILEOSTOMY (N/A) CYSTOSCOPY WITH STENT PLACEMENT (N/A)  Patient Location: PACU  Anesthesia Type:General  Level of Consciousness: awake  Airway and Oxygen Therapy: Patient Spontanous Breathing  Post-op Pain: mild  Post-op Assessment: Post-op Vital signs reviewed  Post-op Vital Signs: Reviewed  Last Vitals:  Filed Vitals:   03/26/15 1552  BP:   Pulse:   Temp: 36.5 C  Resp:     Complications: No apparent anesthesia complications

## 2015-03-26 NOTE — Op Note (Addendum)
OPERATIVE REPORT  DATE OF OPERATION: 03/26/2015  PATIENT:  Michele Palmer  79 y.o. 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  POST-OPERATIVE DIAGNOSIS:  Same  PROCEDURE:  Procedure(s): COLOSTOMY TAKEDOWN, PARTIAL COLECTOMY WITH ANASTOMOSIS, BLADDER REPAIR, DIVERTING LOOP ILEOSTOMY CYSTOSCOPY WITH STENT PLACEMENT  SURGEON:  Surgeon(s): Judeth Horn, MD Georganna Skeans, MD Kathie Rhodes, MD Kathie Rhodes, MD   CO-SURGEON:  Karsten Ro  ASSISTANT: Grandville Silos  ANESTHESIA:   general  EBL: 500 ml  BLOOD ADMINISTERED: none  DRAINS: Nasogastric Tube, Urinary Catheter (Foley) and (56mm) Blake drain(s) in the pelvis   SPECIMEN:  Source of Specimen:  Part of bladder, sigmoid colon, loop colostomy  COUNTS CORRECT:  YES  PROCEDURE DETAILS: She was taken to the operating room and placed on the table initially in the supine position. After an adequate general endotracheal anesthetic was administered she was placed in lithotomy and a cystoscopy performed with stent placement of the left ureter by the urologist.  The patient was maintained in lithotomy position for the general surgical procedure. She was prepped and draped in usual sterile manner exposing her perineum and her abdomen. A proper timeout was performed identifying the patient and procedure to be performed. The loop colostomy site in the left upper quadrant was sutured shut using 2-0 silk suture. The preparation was done afterwards. Midline incision was made with a #10 blade down into the subcutaneous tissue. The fascia was opened using electrocautery then we carefully entered the peritoneal cavity where there were some adhesions of small bowel to the anterior abdominal wall which were carefully taken down.  We entered the peritoneal cavity freely and could see the loop of colon going up to the left upper quadrant loop colostomy site. A GIA-75 stapler was taken across the colon distal to  the loop colostomy and after mobilization of the proximal colon just proximal to the loop to loop colostomy.  We subsequently dissected out the loop from the anterior abdominal wall and left that area open until we can come back repaired the fascia at the end the case.  We then mobilized the distal sigmoid colon down to its rectal attachment. The peritoneal attachments and mesenteric attachment laterally and posteriorly were taken down using electrocautery and also a LigaSure device. A left ureteral stent could be palpated lateral to the line of resection and it was not damaged in the process. The patient's uterus was pushed to the left side, just anterior and to the left of the area of most inflammation. As we took down the inflamed colon from the right side we entered the previously drain abscess cavity and dissected through an area of colon. This was likely attached to the bladder. After we have done so the urologist came and mobilize the bladder and performed a repair of the fistulous tract into the bladder and will be described in a separate procedure.  After the bladder fistula was repaired we continued the dissection of the distal sigmoid colon and proximal rectum into an area that was soft and able to accommodate a Contour transecting stapler device in the distal sigmoid and proximal rectum. This allowed [place] to remove the most inflamed portion of the sigmoid colon.  The plan was to perform an anastomosis and diverting loop ileostomy. We are able to mobilize the proximal descending colon from the splenic flexure and bring it down to attached to the proximal rectum in the pelvis. This is down posterior to the patient's uterus. The assistant  was able to pass the anastomotic device through the anus to the staple line where the trocar was passed through and attached to the 29 mm anvil that had been sized and placed in the proximal colon. The pursestring suture of 2-0 Prolene held the anvil in place. Once  the anastomosis was completed in the pelvis the initial testing showed there to be a leakage of air in the posterior left side of the anastomosis which could be seen. This area was repaired using 2 interrupted figure-of-eight stitches of 2-0 silk. Testing for leakage once a repair was performed demonstrating no evidence of leak at the anastomosis.  A 19 mm Blake drain was placed in the pelvis down deep to and just anterior to the anastomosis and then looped back around just underneath the bladder anteriorly. Was brought out the right lower quadrant of the abdomen and sutured in place with 2-0 nylon.  We irrigated with copious amounts saline after changing the surgeons gown and gloves. The colostomy site on the left side was closed using interrupted figure-of-eight stitches of #1 Vicryl. The subcutaneous tissue and skin were left open for wet-to-dry dressings.  A loop of the terminal ileum was brought out the right lower quadrant of the abdomen and subsequently secured and matured using interrupted 3-0 Vicryl pop-off sutures. This was done after the fascia was closed using running looped #1 PDS suture. The skin and the midline was loosely approximated using stainless steel staples. All counts were correct. A stomal device was placed at the matured side of the loop ileostomy on the right side. All needle counts, sponge counts, and instrument counts were correct.  PATIENT DISPOSITION:  PACU - hemodynamically stable.   Paolina Karwowski 4/29/20163:54 PM

## 2015-03-26 NOTE — Op Note (Addendum)
PATIENT:  Michele Palmer  PRE-OPERATIVE DIAGNOSIS: Colovesical fistula  POST-OPERATIVE DIAGNOSIS: Same  PROCEDURE:  1.Cystoscopy with left retrograde pyelogram including interpretation. 2.  Left ureteral catheterization. 3.  Repair of colovesical fistula  Co-surgeon:  Claybon Jabs  Co-surgeon: Doreen Salvage  INDICATION: Michele Palmer is a 79 year old female who developed pneumaturia and fecaluria.  She was found to have a mass in the pelvis primarily on the left-hand side that was drained percutaneously.  Cystoscopically she was found to have what appeared to be a Herald patch on the posterior wall bladder on the right hand side consistent with her known fistula.She is brought to the operating room today for repair of her fistula.  ANESTHESIA:  General  EBL:  Minimal  DRAINS: 72 French Foley catheter in the bladder, 6 French open-ended ureteral stent in the left ureter and a Blake drain in the pelvis.  LOCAL MEDICATIONS USED:  None  SPECIMEN:  Portion of the bladder including the fistulous tract  Description of procedure: After informed consent the patient was taken to the operating room and placed on the table in a supine position. General anesthesia was then administered. Once fully anesthetized the patient was moved to the dorsal lithotomy position and the genitalia were sterilely prepped and draped in standard fashion. An official timeout was then performed.  The 81 French cystoscope was passed into the bladder under direct vision and the ureteral orifices were noted to be in normal position and configuration.  I fully inspected the bladder and found an erythematous area on the posterior wall to the right of midline consistent with her known fistula.  It appeared to have a central dimple consistent with the actual fistula itself.  This was well away from the trigone.  A 6 French open-ended ureteral catheter was then passed through the cystoscope and into the left ureteral orifice.  I  then injected full-strength contrast in order to perform a left retrograde pyelogram in the standard fashion.  Using real-time fluoroscopy the full-strength contrast was injected up the left ureter which was noted to be entirely normal in its appearance.  There was no filling defects or mass effect.  It appeared to follow a normal course to the kidney with a normal intrarenal collecting system.  A 6 French open-ended ureteral catheter was then passed up the left ureter without difficulty into the area of the renal pelvis with fluoroscopic guidance.  I then drained the bladder through the cystoscope and removed the cystoscope leaving the open-ended catheter in place.  A 20 French Foley catheter was then inserted into the bladder and I then burrowed the 6 [place] open-ended catheter through the wall of the Foley catheter so that it could drain into the catheter bag and secured to the catheter with a silk suture.  I then assisted Dr. Hulen Skains with exploratory laparotomy, lysis of adhesions and exposure of the bladder.  This portion of the operation will be dictated separately by him.  He did assist with my portion of the operation.  I first filled the bladder with 200 cc of sterile water with methylene blue and noted no definite leak into the peritoneal cavity.  The abscess was primarily located to the right of midline.  I placed a sutures in the anterior wall of the bladder and then made an incision in the bladder.  This was carried inferior and superiorly in order to expose the fistulous tract.  I was able to identify the location and placed a right angle  through the opening of the fistula and it passed through the wall of the bladder without resistance.  I used a silk suture in a figure-of-eight fashion to oversew the fistulous tract and used this as a traction suture and then used the Bovie to perform excision of the fistulous tract by excising this in an elliptical fashion.  This was sent to pathology.  I then  proceeded with closure of the bladder.  Bladder closure was performed using 2-0 chromic suture in a 2 layer fashion with the first layer encompassing the muscularis and mucosa.  The second layer encompassed the muscularis and adventitia and was performed in a locking fashion.  I then refilled the bladder to check for leakage and none was noted.  The bladder was therefore drained and the catheter was connected to closed system drainage.  The remainder of the operation was then completed by Dr. Hulen Skains and dictated by him separately.  The Foley catheter will be left indwelling for 2 weeks and she will return for cystogram.  A Blake drain was placed in the pelvis at the end of the operation.  This portion of the operation was well tolerated with no intraoperative complications.   PATIENT DISPOSITION:  PACU - hemodynamically stable.

## 2015-03-26 NOTE — Transfer of Care (Signed)
Immediate Anesthesia Transfer of Care Note  Patient: Michele Palmer  Procedure(s) Performed: Procedure(s): COLOSTOMY TAKEDOWN, PARTIAL COLECTOMY, BLADDER REPAIR, DIVERTING LOOP ILEOSTOMY (N/A) CYSTOSCOPY WITH STENT PLACEMENT (N/A)  Patient Location: PACU  Anesthesia Type:General  Level of Consciousness: awake, alert , oriented and patient cooperative  Airway & Oxygen Therapy: Patient Spontanous Breathing and Patient connected to nasal cannula oxygen  Post-op Assessment: Report given to RN and Post -op Vital signs reviewed and stable  Post vital signs: Reviewed and stable  Last Vitals:  Filed Vitals:   03/26/15 1552  BP:   Pulse:   Temp: 36.5 C  Resp:     Complications: No apparent anesthesia complications

## 2015-03-26 NOTE — H&P (Signed)
[facility] 02/16/2015 9:41 AM Location: [facility] Patient #: [id number] DOB: [birth year] Married / Language: [name] / Race: White Female  History of Present Illness Michele Palmer CMA; 02/16/2015 9:42 AM) Patient words: reck.  The patient is a 79 year old female    Other Problems [name], [place]; 02/16/2015 9:41 AM) Anxiety Disorder Breast Cancer Depression Diverticulosis Gastroesophageal Reflux Disease High blood pressure Melanoma  Past Surgical History [name], CMA; 02/16/2015 9:41 AM) Breast Biopsy Right. Breast Mass; Local Excision Right. Resection of Small Bowel  Diagnostic Studies History [name], CMA; 02/16/2015 9:41 AM) Colonoscopy 1-5 years ago Mammogram within last year Pap Smear 1-5 years ago  Medication History [name], CMA; 02/16/2015 9:43 AM) Anastrozole (1MG  Tablet, Oral) Active. Citalopram Hydrobromide (20MG  Tablet, Oral) Active. Dicyclomine HCl (10MG  Capsule, Oral) Active. Losartan Potassium (100MG  Tablet, Oral) Active. Ranitidine HCl (150MG  Tablet, Oral) Active. Oxycodone-Acetaminophen (5-325MG  Tablet, Oral) Active. Cefuroxime Axetil (500MG  Tablet, Oral) Active. MethylPREDNISolone (Pak) (4MG  Tablet, Oral) Active. Augmentin (875-125MG  Tablet, Oral) Active. Florastor (250MG  Capsule, Oral) Active. Medications Reconciled  Social History [name], CMA; 02/16/2015 9:41 AM) No alcohol use No caffeine use No drug use Tobacco use Never smoker.  Family History [name], CMA; 02/16/2015 9:41 AM) Cerebrovascular Accident Father, Mother. Diabetes Mellitus Mother. Ovarian Cancer Sister.  Pregnancy / Birth History [name], [place]; 02/16/2015 9:41 AM) Age at menarche 21 years. Age of menopause 9-50 Gravida 66 Maternal age 65-25 Para 7  Review of Systems ([place]; 02/16/2015 9:41 AM) General Present- Appetite Loss, Fatigue and Weight Loss. Not Present- Chills, Fever, Night  Sweats and Weight Gain. Skin Not Present- Change in Wart/Mole, Dryness, Hives, Jaundice, New Lesions, Non-Healing Wounds, Rash and Ulcer. HEENT Present- Wears glasses/contact lenses. Not Present- Earache, Hearing Loss, Hoarseness, Nose Bleed, Oral Ulcers, Ringing in the Ears, Seasonal Allergies, Sinus Pain, Sore Throat, Visual Disturbances and Yellow Eyes. Respiratory Not Present- Bloody sputum, Chronic Cough, Difficulty Breathing, Snoring and Wheezing. Breast Not Present- Breast Mass, Breast Pain, Nipple Discharge and Skin Changes. Cardiovascular Present- Swelling of Extremities. Not Present- Chest Pain, Difficulty Breathing Lying Down, Leg Cramps, Palpitations, Rapid Heart Rate and Shortness of Breath. Gastrointestinal Not Present- Abdominal Pain, Bloating, Bloody Stool, Change in Bowel Habits, Chronic diarrhea, Constipation, Difficulty Swallowing, Excessive gas, Gets full quickly at meals, Hemorrhoids, Indigestion, Nausea, Rectal Pain and Vomiting. Female Genitourinary Not Present- Frequency, Nocturia, Painful Urination, Pelvic Pain and Urgency. Musculoskeletal Present- Muscle Weakness. Not Present- Back Pain, Joint Pain, Joint Stiffness, Muscle Pain and Swelling of Extremities. Neurological Not Present- Decreased Memory, Fainting, Headaches, Numbness, Seizures, Tingling, Tremor, Trouble walking and Weakness. Psychiatric Present- Depression. Not Present- Anxiety, Bipolar, Change in Sleep Pattern, Fearful and Frequent crying. Endocrine Not Present- Cold Intolerance, Excessive Hunger, Hair Changes, Heat Intolerance, Hot flashes and New Diabetes. Hematology Not Present- Easy Bruising, Excessive bleeding, Gland problems, HIV and Persistent Infections.   Vitals ([name] CMA; 02/16/2015 9:42 AM) 02/16/2015 9:42 AM Weight: 186 lb Height: 63in Body Surface Area: 1.94 m Body Mass Index: 32.95 kg/m Temp.: 97.63F(Temporal)  Pulse: 87 (Regular)  BP: 130/70 (Sitting, Left Arm,  Standard)    Physical Exam (Kahlia Lagunes O. Nesbit Michon MD; 02/16/2015 10:03 AM) Abdomen Palpation/Percussion Palpation and Percussion of the abdomen reveal - Soft and Non Tender. Auscultation Auscultation of the abdomen reveals - Bowel sounds normal. Note: Patient's colostomy is functioning well. Good output. No infection. Still draining pus from the perc drain.   Assessment & Plan Jeneen Rinks O. Hulen Skains MD; 02/16/2015 10:06 AM) DIVERTICULITIS OF COLON  WITH PERFORATION (562.11  K57.20) Impression: Doing well. Afebrile. Has likely fistula with bladder and needs assessment by surgeon for planning future colectomy and reversal of colostomy. Will need ureteral stents also. Has oppoint with PA of Dr. Karsten Ro, but needs to see the surgeon.  The patient has been assessed by Urology and we will proceed with colectomy, possible anastomosis, bladder repair, and possible diverting ileostomy.  Still has a fistula to distal colon and occasionally having loose bowel movements and feculent drainage.  [name]. [name], MD, [place] [phone] [facility]

## 2015-03-27 LAB — BASIC METABOLIC PANEL
Anion gap: 9 (ref 5–15)
BUN: 15 mg/dL (ref 6–23)
CHLORIDE: 105 mmol/L (ref 96–112)
CO2: 20 mmol/L (ref 19–32)
Calcium: 8.5 mg/dL (ref 8.4–10.5)
Creatinine, Ser: 1.02 mg/dL (ref 0.50–1.10)
GFR calc non Af Amer: 52 mL/min — ABNORMAL LOW (ref >90)
GFR, EST AFRICAN AMERICAN: 60 mL/min — AB (ref >90)
Glucose, Bld: 214 mg/dL — ABNORMAL HIGH (ref 70–99)
POTASSIUM: 5.2 mmol/L — AB (ref 3.5–5.1)
SODIUM: 134 mmol/L — AB (ref 135–145)

## 2015-03-27 LAB — CBC
HCT: 36.1 % (ref 36.0–46.0)
HEMOGLOBIN: 11.9 g/dL — AB (ref 12.0–15.0)
MCH: 30.1 pg (ref 26.0–34.0)
MCHC: 33 g/dL (ref 30.0–36.0)
MCV: 91.4 fL (ref 78.0–100.0)
Platelets: 258 10*3/uL (ref 150–400)
RBC: 3.95 MIL/uL (ref 3.87–5.11)
RDW: 14.3 % (ref 11.5–15.5)
WBC: 20.3 10*3/uL — AB (ref 4.0–10.5)

## 2015-03-27 LAB — POTASSIUM: Potassium: 4.7 mmol/L (ref 3.5–5.1)

## 2015-03-27 LAB — GLUCOSE, CAPILLARY
GLUCOSE-CAPILLARY: 173 mg/dL — AB (ref 70–99)
Glucose-Capillary: 170 mg/dL — ABNORMAL HIGH (ref 70–99)
Glucose-Capillary: 166 mg/dL — ABNORMAL HIGH (ref 70–99)
Glucose-Capillary: 159 mg/dL — ABNORMAL HIGH (ref 70–99)

## 2015-03-27 MED ORDER — WHITE PETROLATUM GEL
Status: AC
  Administered 2015-03-27: 0.2
  Filled 2015-03-27: qty 1

## 2015-03-27 MED ORDER — DEXTROSE-NACL 5-0.9 % IV SOLN
INTRAVENOUS | Status: DC
  Administered 2015-03-27 – 2015-04-01 (×10): via INTRAVENOUS

## 2015-03-27 MED ORDER — INSULIN ASPART 100 UNIT/ML ~~LOC~~ SOLN
0.0000 [IU] | Freq: Three times a day (TID) | SUBCUTANEOUS | Status: DC
  Administered 2015-03-27 (×2): 3 [IU] via SUBCUTANEOUS
  Administered 2015-03-28: 2 [IU] via SUBCUTANEOUS
  Administered 2015-03-28 – 2015-03-29 (×3): 3 [IU] via SUBCUTANEOUS
  Administered 2015-03-29: 2 [IU] via SUBCUTANEOUS
  Administered 2015-03-29: 3 [IU] via SUBCUTANEOUS
  Administered 2015-03-30 – 2015-04-03 (×9): 2 [IU] via SUBCUTANEOUS

## 2015-03-27 NOTE — Progress Notes (Signed)
Initial Nutrition Assessment  INTERVENTION: When diet is advanced, add: Boost Breeze, MVI  NUTRITION DIAGNOSIS:  Predicted suboptimal nutrient intake related to altered GI function, inability to eat as evidenced by NPO status.   GOAL:  Patient will meet greater than or equal to 90% of their needs   MONITOR:  PO intake, Diet advancement, Supplement acceptance, Weight trends, Labs  REASON FOR ASSESSMENT:  Malnutrition Screening Tool    ASSESSMENT: 79 year old female with history of GERD, HTN, diabetes without complication, and multiple myeloma presens for surgery, with the diagnosis of diverticular perforation with abscess and fistula.  s/p Procedure(s): COLOSTOMY TAKEDOWN, PARTIAL COLECTOMY, BLADDER REPAIR, DIVERTING LOOP ILEOSTOMY (N/A) CYSTOSCOPY WITH STENT PLACEMENT (N/A) Pt familiar to nutrition team from previous admission, at which time pt has receiving TPN due to intra-abdominal abscess. Pt used to weight 224 lbs but, lost weight due to varied appetite. Pt states that PTA she had a good appetite and was eating well. Family at bedside reports that patient was eating 50% of meals, eating protein bars, and drinking Resource Breeze supplements at home; pt lost about 2 lbs in the past several weeks.  Pt is currently NPO with NGT in place. Family at bedside reports that NGT was to suction until pt was moved to chair.  Pt would like to receive Resource Breeze supplements.  Labs: elevated glucose  Height:  Ht Readings from Last 1 Encounters:  03/26/15 5' 3"  (1.6 m)    Weight:  Wt Readings from Last 1 Encounters:  03/27/15 183 lb 6.8 oz (83.2 kg)    Ideal Body Weight:  52 kg  Wt Readings from Last 10 Encounters:  03/27/15 183 lb 6.8 oz (83.2 kg)  02/05/15 199 lb 11.2 oz (90.583 kg)  08/25/14 219 lb (99.338 kg)  02/09/14 220 lb 4.8 oz (99.927 kg)  11/10/13 213 lb 8 oz (96.843 kg)  09/22/13 211 lb 4.8 oz (95.845 kg)  09/02/13 209 lb 9.6 oz (95.074 kg)  08/19/13  210 lb 3.2 oz (95.346 kg)  08/01/13 208 lb (94.348 kg)  07/04/13 205 lb 12.8 oz (93.35 kg)  01/18/15 188 lb  BMI:  Body mass index is 32.5 kg/(m^2). (Obese)  Estimated Nutritional Needs:  Kcal:  1700-1900  Protein:  90-100 grams  Fluid:  1.9 L/day  Skin:   abdominal incision  Diet Order:  Diet NPO time specified  EDUCATION NEEDS:   none   Intake/Output Summary (Last 24 hours) at 03/27/15 1336 Last data filed at 03/27/15 0837  Gross per 24 hour  Intake   3585 ml  Output   1620 ml  Net   1965 ml    Last BM:  PTA   Pryor Ochoa RD, LDN Inpatient Clinical Dietitian Pager: [phone] After Hours Pager: [phone]

## 2015-03-27 NOTE — Evaluation (Signed)
Physical Therapy Evaluation Patient Details Name: Michele Palmer MRN: [id number] DOB: 1936/07/09 Today's Date: 03/27/2015   History of Present Illness  Michele Palmer has presented today for surgery, with the diagnosis of diverticular perforation with abscess and fistula,  S/P COLOSTOMY TAKEDOWN, PARTIAL COLECTOMY WITH ANASTOMOSIS, BLADDER REPAIR, DIVERTING LOOP ILEOSTOMY  Clinical Impression  Pt admitted with/for above procedures stated above.  Pt currently limited functionally due to the problems listed below.  (see problems list.)  Pt will benefit from PT to maximize function and safety to be able to get home safely with available assist of family.     Follow Up Recommendations Home health PT;Supervision for mobility/OOB    Equipment Recommendations  None recommended by PT (TBA)    Recommendations for Other Services       Precautions / Restrictions Precautions Precautions: Fall      Mobility  Bed Mobility Overal bed mobility: Needs Assistance Bed Mobility: Rolling;Sidelying to Sit Rolling: Min assist Sidelying to sit: Min assist       General bed mobility comments: cues for technique  Transfers Overall transfer level: Needs assistance Equipment used: 1 person hand held assist Transfers: Sit to/from Stand Sit to Stand: Min assist         General transfer comment: uses legs against the bed to stabilize  Ambulation/Gait Ambulation/Gait assistance: Min assist;+2 safety/equipment Ambulation Distance (Feet): 15 Feet Assistive device: 1 person hand held assist Gait Pattern/deviations: Decreased step length - right;Decreased step length - left;Step-through pattern;Decreased stride length Gait velocity: slow and guarded   General Gait Details: slow short guarded steps  Stairs            Wheelchair Mobility    Modified Rankin (Stroke Patients Only)       Balance Overall balance assessment: Needs assistance Sitting-balance support: No upper extremity  supported Sitting balance-Leahy Scale: Fair     Standing balance support: Bilateral upper extremity supported Standing balance-Leahy Scale: Poor                               Pertinent Vitals/Pain Pain Assessment: Faces Faces Pain Scale: Hurts even more Pain Location: stomack incisions/stoma site Pain Descriptors / Indicators: Sore Pain Intervention(s): Monitored during session;Repositioned    Home Living Family/patient expects to be discharged to:: Private residence Living Arrangements: Spouse/significant other Available Help at Discharge: Family;Available 24 hours/day Type of Home: House Home Access: Stairs to enter Entrance Stairs-Rails: None Entrance Stairs-Number of Steps: 2 Home Layout: One level Home Equipment: None      Prior Function Level of Independence: Independent               Hand Dominance   Dominant Hand: Right    Extremity/Trunk Assessment   Upper Extremity Assessment: Defer to OT evaluation           Lower Extremity Assessment: Generalized weakness;Overall [facility] for tasks assessed (truncal weaknesses also)         Communication   Communication: No difficulties  Cognition Arousal/Alertness: Awake/alert Behavior During Therapy: WFL for tasks assessed/performed Overall Cognitive Status: Within Functional Limits for tasks assessed                      General Comments General comments (skin integrity, edema, etc.): sats on RA staying at 91% and EHR 100's   Oxygen reapplied    Exercises        Assessment/Plan    PT Assessment Patient  needs continued PT services  PT Diagnosis Difficulty walking;Generalized weakness   PT Problem List Decreased strength;Decreased activity tolerance;Decreased balance;Decreased mobility;Decreased knowledge of use of DME;Cardiopulmonary status limiting activity;Pain  PT Treatment Interventions Gait training;Functional mobility training;Therapeutic activities;Balance  training;Patient/family education;Stair training;DME instruction   PT Goals (Current goals can be found in the Care Plan section) Acute Rehab PT Goals Patient Stated Goal: mobilizing more easily PT Goal Formulation: With patient Time For Goal Achievement: 04/10/15 Potential to Achieve Goals: Good    Frequency Min 3X/week   Barriers to discharge        Co-evaluation               End of Session Equipment Utilized During Treatment: Oxygen Activity Tolerance: Patient tolerated treatment well Patient left: in chair;with call bell/phone within reach;with family/visitor present;with nursing/sitter in room Nurse Communication: Mobility status         Time: 6283-1517 PT Time Calculation (min) (ACUTE ONLY): 24 min   Charges:   PT Evaluation $Initial PT Evaluation Tier I: 1 Procedure PT Treatments $Gait Training: 8-22 mins   PT G Codes:        Mitchael Luckey, Tessie Fass 03/27/2015, 12:39 PM 03/27/2015  Donnella Sham, PT [phone]  (pager)

## 2015-03-27 NOTE — Progress Notes (Signed)
1 Day Post-Op  Subjective:  1 - Colovesical Fistula - s/p cysto, left retrograde, and left colovesical fistula repir 03/26/15 in combined gen surg Hulen Skains) and Urol Karsten Ro) procedure.  Today Michele Palmer is without complaints. Cr and Hgb stable. JP with 250 output overnight.   Objective: Vital signs in last 24 hours: Temp:  [97.6 F (36.4 C)-98.2 F (36.8 C)] 98.1 F (36.7 C) (04/30 0414) Pulse Rate:  [72-109] 107 (04/30 0414) Resp:  [10-22] 10 (04/30 0414) BP: (86-245)/(44-96) 105/65 mmHg (04/30 0414) SpO2:  [95 %-100 %] 95 % (04/30 0414) Weight:  [83 kg (182 lb 15.7 oz)-84.369 kg (186 lb)] 83.2 kg (183 lb 6.8 oz) (04/30 0414)    Intake/Output from previous day: 04/29 0701 - 04/30 0700 In: 4335 [I.V.:3485; IV Piggyback:850] Out: 3244 [Urine:700; Drains:255; Blood:650] Intake/Output this shift: Total I/O In: 1110 [I.V.:1060; IV Piggyback:50] Out: 470 [Urine:290; Drains:180]  General appearance: alert, cooperative, appears stated age and family at bedside Head: Normocephalic, without obvious abnormality, atraumatic Nose: Nares normal. Septum midline. Mucosa normal. No drainage or sinus tenderness. Back: symmetric, no curvature. ROM normal. No CVA tenderness. Resp: non-labored on [place] O2 Cardio: Nl rate GI: soft, non-tender; bowel sounds normal; no masses,  no organomegaly and new colostomy w/o gas/stool in bag. NGT in place, currentley clamped.  Pelvic: external genitalia normal and foley c/d/i with pink urine as expected, no clots.  Extremities: extremities normal, atraumatic, no cyanosis or edema Lymph nodes: Cervical, supraclavicular, and axillary nodes normal. Neurologic: Grossly normal Incision/Wound: recent incision sites c/d/i.   Lab Results:   Recent Labs  03/26/15 2231 03/27/15 0328  WBC 16.2* 20.3*  HGB 11.9* 11.9*  HCT 37.3 36.1  PLT 240 258   BMET  Recent Labs  03/24/15 0842 03/26/15 1421 03/27/15 0328  NA 133* 141 134*  K 3.9 3.1* 5.2*  CL 100  --  105   CO2 25  --  20  GLUCOSE 130* 142* 214*  BUN 18  --  15  CREATININE 0.67  --  1.02  CALCIUM 9.3  --  8.5   PT/INR No results for input(s): LABPROT, INR in the last 72 hours. ABG No results for input(s): PHART, HCO3 in the last 72 hours.  Invalid input(s): PCO2, PO2  Studies/Results: Dg C-arm 1-60 Min-no Report  03/26/2015   CLINICAL DATA: cysto   C-ARM 1-60 MINUTES  Fluoroscopy was utilized by the requesting physician.  No radiographic  interpretation.     Anti-infectives: Anti-infectives    Start     Dose/Rate Route Frequency Ordered Stop   03/26/15 2200  cefoTEtan (CEFOTAN) 1 g in dextrose 5 % 50 mL IVPB     1 g 100 mL/hr over 30 Minutes Intravenous Every 12 hours 03/26/15 1612 03/27/15 2159   03/26/15 0921  neomycin (MYCIFRADIN) tablet 1,000 mg  Status:  Discontinued     1,000 mg Oral 3 times per day 03/26/15 0921 03/26/15 0927   03/26/15 0921  erythromycin (E-MYCIN) tablet 1,000 mg  Status:  Discontinued     1,000 mg Oral 3 times per day 03/26/15 0921 03/26/15 0927   03/26/15 0921  cefoTEtan (CEFOTAN) 2 g in dextrose 5 % 50 mL IVPB     2 g 100 mL/hr over 30 Minutes Intravenous On call to O.R. 03/26/15 0921 03/26/15 1133      Assessment/Plan:   1 - Colovesical Fistula - doing well POD 1. Drain to remain for now.   [facility], Xzavian Semmel 03/27/2015

## 2015-03-27 NOTE — Progress Notes (Signed)
1 Day Post-Op  Subjective: Pt with no acute changes.  Objective: Vital signs in last 24 hours: Temp:  [97.4 F (36.3 C)-98.2 F (36.8 C)] 97.4 F (36.3 C) (04/30 0708) Pulse Rate:  [72-109] 107 (04/30 0414) Resp:  [10-22] 10 (04/30 0414) BP: (86-161)/(44-96) 105/65 mmHg (04/30 0414) SpO2:  [95 %-100 %] 95 % (04/30 0414) Weight:  [83 kg (182 lb 15.7 oz)-83.2 kg (183 lb 6.8 oz)] 83.2 kg (183 lb 6.8 oz) (04/30 0414)    Intake/Output from previous day: 04/29 0701 - 04/30 0700 In: 4585 [I.V.:3735; IV Piggyback:850] Out: 5790 [Urine:700; Drains:255; Blood:650] Intake/Output this shift: Total I/O In: -  Out: 215 [Urine:175; Drains:40]  General appearance: alert and cooperative GI: soft, approp ttp, NS, wound c/d/i, JPs SS  Lab Results:   Recent Labs  03/26/15 2231 03/27/15 0328  WBC 16.2* 20.3*  HGB 11.9* 11.9*  HCT 37.3 36.1  PLT 240 258   BMET  Recent Labs  03/26/15 1421 03/27/15 0328  NA 141 134*  K 3.1* 5.2*  CL  --  105  CO2  --  20  GLUCOSE 142* 214*  BUN  --  15  CREATININE  --  1.02  CALCIUM  --  8.5   PT/INR No results for input(s): LABPROT, INR in the last 72 hours. ABG No results for input(s): PHART, HCO3 in the last 72 hours.  Invalid input(s): PCO2, PO2  Studies/Results: Dg C-arm 1-60 Min-no Report  03/26/2015   CLINICAL DATA: cysto   C-ARM 1-60 MINUTES  Fluoroscopy was utilized by the requesting physician.  No radiographic  interpretation.     Anti-infectives: Anti-infectives    Start     Dose/Rate Route Frequency Ordered Stop   03/26/15 2200  cefoTEtan (CEFOTAN) 1 g in dextrose 5 % 50 mL IVPB     1 g 100 mL/hr over 30 Minutes Intravenous Every 12 hours 03/26/15 1612 03/27/15 2159   03/26/15 0921  neomycin (MYCIFRADIN) tablet 1,000 mg  Status:  Discontinued     1,000 mg Oral 3 times per day 03/26/15 0921 03/26/15 0927   03/26/15 0921  erythromycin (E-MYCIN) tablet 1,000 mg  Status:  Discontinued     1,000 mg Oral 3 times per day  03/26/15 3833 03/26/15 0927   03/26/15 0921  cefoTEtan (CEFOTAN) 2 g in dextrose 5 % 50 mL IVPB     2 g 100 mL/hr over 30 Minutes Intravenous On call to O.R. 03/26/15 0921 03/26/15 1133      Assessment/Plan: s/p Procedure(s): COLOSTOMY TAKEDOWN, PARTIAL COLECTOMY, BLADDER REPAIR, DIVERTING LOOP ILEOSTOMY (N/A) CYSTOSCOPY WITH STENT PLACEMENT (N/A) Con't foley x 2 weeks as per Uro PCA Will start accuchecks and ISS Change IVF  Mobilize   LOS: 1 day    Michele Palmer., Michele Palmer 03/27/2015

## 2015-03-28 DIAGNOSIS — L02412 Cutaneous abscess of left axilla: Secondary | ICD-10-CM

## 2015-03-28 HISTORY — DX: Cutaneous abscess of left axilla: L02.412

## 2015-03-28 LAB — BASIC METABOLIC PANEL
Anion gap: 7 (ref 5–15)
BUN: 16 mg/dL (ref 6–20)
CO2: 25 mmol/L (ref 22–32)
Calcium: 8.7 mg/dL — ABNORMAL LOW (ref 8.9–10.3)
Chloride: 104 mmol/L (ref 101–111)
Creatinine, Ser: 1.27 mg/dL — ABNORMAL HIGH (ref 0.44–1.00)
GFR calc Af Amer: 46 mL/min — ABNORMAL LOW (ref >60)
GFR calc non Af Amer: 40 mL/min — ABNORMAL LOW (ref >60)
Glucose, Bld: 148 mg/dL — ABNORMAL HIGH (ref 70–99)
Potassium: 4.6 mmol/L (ref 3.5–5.1)
Sodium: 136 mmol/L (ref 135–145)

## 2015-03-28 LAB — GLUCOSE, CAPILLARY
GLUCOSE-CAPILLARY: 175 mg/dL — AB (ref 70–99)
GLUCOSE-CAPILLARY: 175 mg/dL — AB (ref 70–99)
Glucose-Capillary: 160 mg/dL — ABNORMAL HIGH (ref 70–99)
Glucose-Capillary: 168 mg/dL — ABNORMAL HIGH (ref 70–99)
Glucose-Capillary: 168 mg/dL — ABNORMAL HIGH (ref 70–99)

## 2015-03-28 LAB — CBC
HEMATOCRIT: 32.1 % — AB (ref 36.0–46.0)
HEMOGLOBIN: 10.2 g/dL — AB (ref 12.0–15.0)
MCH: 29.7 pg (ref 26.0–34.0)
MCHC: 31.8 g/dL (ref 30.0–36.0)
MCV: 93.3 fL (ref 78.0–100.0)
Platelets: 220 10*3/uL (ref 150–400)
RBC: 3.44 MIL/uL — ABNORMAL LOW (ref 3.87–5.11)
RDW: 14.5 % (ref 11.5–15.5)
WBC: 23.1 10*3/uL — AB (ref 4.0–10.5)

## 2015-03-28 MED ORDER — METOPROLOL TARTRATE 1 MG/ML IV SOLN
5.0000 mg | INTRAVENOUS | Status: DC | PRN
  Administered 2015-03-28 – 2015-03-29 (×2): 5 mg via INTRAVENOUS
  Filled 2015-03-28 (×2): qty 5

## 2015-03-28 NOTE — Care Management Note (Addendum)
Case Management Note  Patient Details  Name: Michele Palmer MRN: [id number] Date of Birth: 1936/04/17  Subjective/Objective:              Pt admitted with diverticular perforation, from home with husband.      Action/Plan:  Return to home when medically stable. CM to f/u with discharge disposition.  Expected Discharge Date:  03/30/15               Expected Discharge Plan:  [place]  In-House Referral:     Discharge planning Services  CM Consult  Post Acute Care Choice:    Choice offered to:  Adult Children (advance home care services for [facility] needs @ discharge per daughter Shelton Silvas [phone])  DME Arranged:    DME Agency:     HH Arranged:    HH Agency:     Status of Service:  In process, will continue to follow  Medicare Important Message Given:    Date Medicare IM Given:    Medicare IM give by:    Date Additional Medicare IM Given:    Additional Medicare Important Message give by:     If discussed at [place] of Stay Meetings, dates discussed:    Additional Comments:   Emergency contact Shelton Silvas ( daughter) [phone] Sharin Mons, [name] 03/28/2015, 2:02 PM

## 2015-03-28 NOTE — Progress Notes (Signed)
2 Days Post-Op  Subjective: Not much out ileostomy yet  Objective: Vital signs in last 24 hours: Temp:  [97.7 F (36.5 C)-98.4 F (36.9 C)] 98.4 F (36.9 C) (05/01 0800) Pulse Rate:  [106-113] 112 (05/01 0320) Resp:  [10-24] 24 (05/01 1116) BP: (108-132)/(58-101) 115/61 mmHg (05/01 0320) SpO2:  [91 %-96 %] 92 % (05/01 1116)    Intake/Output from previous day: 04/30 0701 - 05/01 0700 In: 2218 [I.V.:2218] Out: 1095 [Urine:700; Emesis/NG output:200; Drains:185; Stool:10] Intake/Output this shift: Total I/O In: -  Out: 165 [Urine:150; Drains:15]  General appearance: cooperative Resp: clear to auscultation bilaterally Cardio: S1, S2 normal GI: soft, ileostomy pink with minimal output, +BS, midline dressing with visable ointment  Lab Results:   Recent Labs  03/27/15 0328 03/28/15 0329  WBC 20.3* 23.1*  HGB 11.9* 10.2*  HCT 36.1 32.1*  PLT 258 220   BMET  Recent Labs  03/27/15 0328 03/27/15 1145 03/28/15 0329  NA 134*  --  136  K 5.2* 4.7 4.6  CL 105  --  104  CO2 20  --  25  GLUCOSE 214*  --  148*  BUN 15  --  16  CREATININE 1.02  --  1.27*  CALCIUM 8.5  --  8.7*   PT/INR No results for input(s): LABPROT, INR in the last 72 hours. ABG No results for input(s): PHART, HCO3 in the last 72 hours.  Invalid input(s): PCO2, PO2  Studies/Results: Dg C-arm 1-60 Min-no Report  03/26/2015   CLINICAL DATA: cysto   C-ARM 1-60 MINUTES  Fluoroscopy was utilized by the requesting physician.  No radiographic  interpretation.     Anti-infectives: Anti-infectives    Start     Dose/Rate Route Frequency Ordered Stop   03/26/15 2200  cefoTEtan (CEFOTAN) 1 g in dextrose 5 % 50 mL IVPB     1 g 100 mL/hr over 30 Minutes Intravenous Every 12 hours 03/26/15 1612 03/27/15 1255   03/26/15 0921  neomycin (MYCIFRADIN) tablet 1,000 mg  Status:  Discontinued     1,000 mg Oral 3 times per day 03/26/15 0921 03/26/15 0927   03/26/15 0921  erythromycin (E-MYCIN) tablet 1,000 mg   Status:  Discontinued     1,000 mg Oral 3 times per day 03/26/15 0569 03/26/15 0927   03/26/15 0921  cefoTEtan (CEFOTAN) 2 g in dextrose 5 % 50 mL IVPB     2 g 100 mL/hr over 30 Minutes Intravenous On call to O.R. 03/26/15 0921 03/26/15 1133      Assessment/Plan: s/p Procedure(s): COLOSTOMY TAKEDOWN, PARTIAL COLECTOMY, BLADDER REPAIR, DIVERTING LOOP ILEOSTOMY (N/A) CYSTOSCOPY WITH STENT PLACEMENT (N/A) POD#2 Continue foley for 2 weeks Await return of bowel function Check labs in AM  LOS: 2 days    Kirin Pastorino E 03/28/2015

## 2015-03-28 NOTE — Progress Notes (Signed)
2 Days Post-Op  Subjective:  1 - Colovesical Fistula - s/p cysto, left retrograde, and left colovesical fistula repir 03/26/15 in combined gen surg Hulen Skains) and Urol Karsten Ro) procedure.  Today Denice Paradise is without complaints. Cr and Hgb stable. JP output trending down. Pain control still a challenge. No colostomy output.   Objective: Vital signs in last 24 hours: Temp:  [97.7 F (36.5 C)-98.4 F (36.9 C)] 98.4 F (36.9 C) (05/01 0800) Pulse Rate:  [106-113] 112 (05/01 0320) Resp:  [10-17] 11 (05/01 0320) BP: (108-132)/(58-101) 115/61 mmHg (05/01 0320) SpO2:  [90 %-96 %] 92 % (05/01 0320)    Intake/Output from previous day: 04/30 0701 - 05/01 0700 In: 2218 [I.V.:2218] Out: 1095 [Urine:700; Emesis/NG output:200; Drains:185; Stool:10] Intake/Output this shift: Total I/O In: -  Out: 165 [Urine:150; Drains:15]  General appearance: alert, cooperative, appears stated age and daughter at bedside Head: Normocephalic, without obvious abnormality, atraumatic Nose: Nares normal. Septum midline. Mucosa normal. No drainage or sinus tenderness., on [place] O2 Throat: lips, mucosa, and tongue normal; teeth and gums normal Neck: supple, symmetrical, trachea midline Back: symmetric, no curvature. ROM normal. No CVA tenderness. Resp: non-labored Cardio: regular tachycardia by bedside monitor GI: soft, non-tender; bowel sounds normal; no masses,  no organomegaly and colsotmy pink, no gas/stoll in appliiance Pelvic: external genitalia normal and foley c/d/i with clearing urine. Extremities: extremities normal, atraumatic, no cyanosis or edema Lymph nodes: Cervical, supraclavicular, and axillary nodes normal. Neurologic: Grossly normal Incision/Wound: recent incision sites c/d/i.   Lab Results:   Recent Labs  03/27/15 0328 03/28/15 0329  WBC 20.3* 23.1*  HGB 11.9* 10.2*  HCT 36.1 32.1*  PLT 258 220   BMET  Recent Labs  03/27/15 0328 03/27/15 1145 03/28/15 0329  NA 134*  --  136  K 5.2* 4.7  4.6  CL 105  --  104  CO2 20  --  25  GLUCOSE 214*  --  148*  BUN 15  --  16  CREATININE 1.02  --  1.27*  CALCIUM 8.5  --  8.7*   PT/INR No results for input(s): LABPROT, INR in the last 72 hours. ABG No results for input(s): PHART, HCO3 in the last 72 hours.  Invalid input(s): PCO2, PO2  Studies/Results: Dg C-arm 1-60 Min-no Report  03/26/2015   CLINICAL DATA: cysto   C-ARM 1-60 MINUTES  Fluoroscopy was utilized by the requesting physician.  No radiographic  interpretation.     Anti-infectives: Anti-infectives    Start     Dose/Rate Route Frequency Ordered Stop   03/26/15 2200  cefoTEtan (CEFOTAN) 1 g in dextrose 5 % 50 mL IVPB     1 g 100 mL/hr over 30 Minutes Intravenous Every 12 hours 03/26/15 1612 03/27/15 1255   03/26/15 0921  neomycin (MYCIFRADIN) tablet 1,000 mg  Status:  Discontinued     1,000 mg Oral 3 times per day 03/26/15 0921 03/26/15 0927   03/26/15 0921  erythromycin (E-MYCIN) tablet 1,000 mg  Status:  Discontinued     1,000 mg Oral 3 times per day 03/26/15 0921 03/26/15 0927   03/26/15 0921  cefoTEtan (CEFOTAN) 2 g in dextrose 5 % 50 mL IVPB     2 g 100 mL/hr over 30 Minutes Intravenous On call to O.R. 03/26/15 0921 03/26/15 1133      Assessment/Plan:  1 - Colovesical Fistula - doing well POD 2. Drain to remain for now. tachcardia and leukocytosis somewhat concerning. Would consider re-imaging in worsens.   [facility], Nick Stults 03/28/2015

## 2015-03-29 ENCOUNTER — Encounter (HOSPITAL_COMMUNITY): Payer: Self-pay | Admitting: General Surgery

## 2015-03-29 LAB — BASIC METABOLIC PANEL
Anion gap: 6 (ref 5–15)
BUN: 14 mg/dL (ref 6–20)
CHLORIDE: 106 mmol/L (ref 101–111)
CO2: 25 mmol/L (ref 22–32)
CREATININE: 0.98 mg/dL (ref 0.44–1.00)
Calcium: 8.4 mg/dL — ABNORMAL LOW (ref 8.9–10.3)
GFR calc Af Amer: >60 mL/min (ref >60)
GFR calc non Af Amer: 54 mL/min — ABNORMAL LOW (ref >60)
GLUCOSE: 156 mg/dL — AB (ref 70–99)
Potassium: 3.9 mmol/L (ref 3.5–5.1)
Sodium: 137 mmol/L (ref 135–145)

## 2015-03-29 LAB — CBC
HCT: 27.8 % — ABNORMAL LOW (ref 36.0–46.0)
Hemoglobin: 8.9 g/dL — ABNORMAL LOW (ref 12.0–15.0)
MCH: 29.8 pg (ref 26.0–34.0)
MCHC: 32 g/dL (ref 30.0–36.0)
MCV: 93 fL (ref 78.0–100.0)
PLATELETS: 214 10*3/uL (ref 150–400)
RBC: 2.99 MIL/uL — ABNORMAL LOW (ref 3.87–5.11)
RDW: 14.6 % (ref 11.5–15.5)
WBC: 22.5 10*3/uL — ABNORMAL HIGH (ref 4.0–10.5)

## 2015-03-29 LAB — GLUCOSE, CAPILLARY
GLUCOSE-CAPILLARY: 130 mg/dL — AB (ref 70–99)
GLUCOSE-CAPILLARY: 166 mg/dL — AB (ref 70–99)
GLUCOSE-CAPILLARY: 158 mg/dL — AB (ref 70–99)
Glucose-Capillary: 157 mg/dL — ABNORMAL HIGH (ref 70–99)

## 2015-03-29 MED ORDER — DOUBLE ANTIBIOTIC 500-10000 UNIT/GM EX OINT
TOPICAL_OINTMENT | Freq: Two times a day (BID) | CUTANEOUS | Status: DC
  Administered 2015-03-29 (×2): via TOPICAL
  Administered 2015-03-30 – 2015-03-31 (×2): 1 via TOPICAL
  Administered 2015-03-31 – 2015-04-03 (×6): via TOPICAL
  Filled 2015-03-29: qty 15
  Filled 2015-03-29: qty 14.17
  Filled 2015-03-29: qty 15

## 2015-03-29 NOTE — Progress Notes (Signed)
[facility]  Patient Status: Active (receiving services up to time of hospitalization)  [facility] is providing the following services: RN  If patient discharges after hours, please call [phone].   Michele Palmer 03/29/2015, 11:47 AM

## 2015-03-29 NOTE — Consult Note (Signed)
WOC ostomy follow up Stoma type/location: RLQ, end ileostomy Stomal assessment/size: 1 3/4" round, pouch intact from OR Peristomal assessment: pouch intact  Output none Ostomy pouching: 2pc. In place from [place] provided: none this am, patient is very lethargic. Daughter at bedside we did discuss differences between ileostomy and colostomy Enrolled patient in [place] Discharge program: Yes at time of previous admission.   [place] team will follow along with you for support with ostomy care and education.  Raheim Beutler Sloan RN,CWOCN [phone]

## 2015-03-29 NOTE — Progress Notes (Signed)
Chaplain responded to consult.  Chaplain had visited pt on previous hospitalization on another unit.  Pt first reported being in pain but declined nurse help and pressed a button for pain medicine instead.  Pt requested prayer and while praying pt began to fall asleep.  Pt's son also at bedside.  Chaplain provided emotional and spiritual support as well as the ministry of prayer and presence.  Chaplain will follow up as needed.    03/29/15 1500  Clinical Encounter Type  Visited With Patient and family together  Visit Type Follow-up;Spiritual support  Referral From Patient  Spiritual Encounters  Spiritual Needs Prayer;Emotional  Stress Factors  Patient Stress Factors Health changes  Family Stress Factors None identified   Geralyn Flash 03/29/2015  3:49 PM

## 2015-03-29 NOTE — Progress Notes (Signed)
Patient ID: Michele Palmer, female   DOB: Jun 18, 1936, 79 y.o.   MRN: [id number] 3 Days Post-Op Subjective: She reports incisional pain but does not indicate she is having any episodic bladder pain to suggest bladder spasms. In addition she has developed back pain that is more pronounced on the left-hand side.  Objective: Vital signs in last 24 hours: Temp:  [98.1 F (36.7 C)-99 F (37.2 C)] 98.6 F (37 C) (05/02 0430) Pulse Rate:  [85-119] 106 (05/02 0430) Resp:  [11-31] 14 (05/02 0430) BP: (141-167)/(66-92) 148/75 mmHg (05/02 0430) SpO2:  [92 %-96 %] 94 % (05/02 0430)  Intake/Output from previous day: 05/01 0701 - 05/02 0700 In: 2965 [I.V.:2875; NG/GT:90] Out: 1455 [Urine:925; Emesis/NG output:350; Drains:180] Intake/Output this shift:    Past Medical History  Diagnosis Date  . Anemia   . Arthritis   . Cancer   . GERD (gastroesophageal reflux disease)   . Anxiety   . Depression   . Breast cancer, right breast 07/04/2013    ILC, 1.8 cm, neg sln receptor+, surgery on 08/01/13   . Hypertension   . Diabetes mellitus without complication     pt stated she is no longer diabetic and is not required to take any glucose meds  . Colovesical fistula    Current Facility-Administered Medications  Medication Dose Route Frequency Provider Last Rate Last Dose  . antiseptic oral rinse (CPC / CETYLPYRIDINIUM CHLORIDE 0.05%) solution 7 mL  7 mL Mouth Rinse q12n4p Judeth Horn, MD   7 mL at 03/28/15 1600  . chlorhexidine (PERIDEX) 0.12 % solution 15 mL  15 mL Mouth Rinse BID Judeth Horn, MD   15 mL at 03/28/15 2000  . dextrose 5 %-0.9 % sodium chloride infusion   Intravenous Continuous Ralene Ok, MD 125 mL/hr at 03/28/15 2000    . diphenhydrAMINE (BENADRYL) injection 12.5 mg  12.5 mg Intravenous Q6H PRN Judeth Horn, MD       Or  . diphenhydrAMINE (BENADRYL) 12.5 MG/5ML elixir 12.5 mg  12.5 mg Oral Q6H PRN Judeth Horn, MD      . enoxaparin (LOVENOX) injection 40 mg  40 mg Subcutaneous Q24H  Judeth Horn, MD   40 mg at 03/28/15 1521  . HYDROmorphone (DILAUDID) PCA injection 0.3 mg/mL   Intravenous 6 times per day Judeth Horn, MD      . insulin aspart (novoLOG) injection 0-15 Units  0-15 Units Subcutaneous TID WC Ralene Ok, MD   3 Units at 03/28/15 1800  . metoprolol (LOPRESSOR) injection 5 mg  5 mg Intravenous Q4H PRN Georganna Skeans, MD   5 mg at 03/28/15 1937  . naloxone [facility]) injection 0.4 mg  0.4 mg Intravenous PRN Judeth Horn, MD       And  . sodium chloride 0.9 % injection 9 mL  9 mL Intravenous PRN Judeth Horn, MD      . ondansetron [facility]) injection 4 mg  4 mg Intravenous Q6H PRN Judeth Horn, MD        Physical Exam:  General: Patient is in no apparent distress Lungs: Normal respiratory effort, chest expands symmetrically. GI: The abdomen is soft and non-distended. Palpation reveals no guarding or tenderness in the right or left lower quadrants.   Her Foley catheter is draining slightly blood-tinged urine with no clots and her drain output appears bloody serosanguineous     Lab Results:  Recent Labs  03/27/15 0328 03/28/15 0329 03/29/15 0245  WBC 20.3* 23.1* 22.5*  HGB 11.9* 10.2* 8.9*  HCT 36.1  32.1* 27.8*   BMET  Recent Labs  03/28/15 0329 03/29/15 0245  NA 136 137  K 4.6 3.9  CL 104 106  CO2 25 25  GLUCOSE 148* 156*  BUN 16 14  CREATININE 1.27* 0.98  CALCIUM 8.7* 8.4*   No results for input(s): LABPT, INR in the last 72 hours. No results for input(s): LABURIN in the last 72 hours. Results for orders placed or performed during the hospital encounter of 03/26/15  MRSA PCR Screening     Status: None   Collection Time: 03/26/15  6:55 PM  Result Value Ref Range Status   MRSA by PCR NEGATIVE NEGATIVE Final    Comment:        The GeneXpert MRSA Assay (FDA approved for NASAL specimens only), is one component of a comprehensive MRSA colonization surveillance program. It is not intended to diagnose MRSA infection nor to guide or monitor  treatment for MRSA infections.     Studies/Results: No results found.  Assessment/Plan: S/P excision of bladder fistula and closure: Her drain output is decreasing and her Foley catheter is draining. Her creatinine has improved.   She is having pain in the back but this is new and I don't think it is from her kidney as she seemed to be tender to palpation. If this persists further evaluation with a CT scan may be helpful. Her white count remains elevated but she does not appear to have any peritoneal signs. She is on appropriate antibiotics. If it remains elevated I think imaging with CT scan might be indicated.  Branch Pacitti C 03/29/2015, 7:13 AM

## 2015-03-29 NOTE — Progress Notes (Addendum)
GS Progress Note Subjective: Patient doing fairly well.  Got out of bed for 3 hours yesterday.  Nothing draining from ostomy bag.  Objective: Vital signs in last 24 hours: Temp:  [98.1 F (36.7 C)-99 F (37.2 C)] 98.9 F (37.2 C) (05/02 0700) Pulse Rate:  [85-119] 106 (05/02 0430) Resp:  [11-31] 14 (05/02 0430) BP: (141-167)/(66-92) 148/75 mmHg (05/02 0430) SpO2:  [92 %-96 %] 94 % (05/02 0430)    Intake/Output from previous day: 05/01 0701 - 05/02 0700 In: 2965 [I.V.:2875; NG/GT:90] Out: 7253 [Urine:1025; Emesis/NG output:350; Drains:180] Intake/Output this shift:    Lungs: Clear to auscultation.  Abd: Soft, hypoactive bowel sounds, ostomy site is clean.  Midline wound is a bit soupy, but no feculent drainage.  Occasional left sided flank pain.  Blake drainage is serosanguinous.  Urine is still very bloody.  Extremities: No clinical signs or symptoms of DVT  Neuro: Intact  Lab Results: CBC   Recent Labs  03/28/15 0329 03/29/15 0245  WBC 23.1* 22.5*  HGB 10.2* 8.9*  HCT 32.1* 27.8*  PLT 220 214   BMET  Recent Labs  03/28/15 0329 03/29/15 0245  NA 136 137  K 4.6 3.9  CL 104 106  CO2 25 25  GLUCOSE 148* 156*  BUN 16 14  CREATININE 1.27* 0.98  CALCIUM 8.7* 8.4*   PT/INR No results for input(s): LABPROT, INR in the last 72 hours. ABG No results for input(s): PHART, HCO3 in the last 72 hours.  Invalid input(s): PCO2, PO2  Studies/Results: No results found.  Anti-infectives: Anti-infectives    Start     Dose/Rate Route Frequency Ordered Stop   03/26/15 2200  cefoTEtan (CEFOTAN) 1 g in dextrose 5 % 50 mL IVPB     1 g 100 mL/hr over 30 Minutes Intravenous Every 12 hours 03/26/15 1612 03/27/15 1255   03/26/15 0921  neomycin (MYCIFRADIN) tablet 1,000 mg  Status:  Discontinued     1,000 mg Oral 3 times per day 03/26/15 0921 03/26/15 0927   03/26/15 0921  erythromycin (E-MYCIN) tablet 1,000 mg  Status:  Discontinued     1,000 mg Oral 3 times per day  03/26/15 6644 03/26/15 0927   03/26/15 0921  cefoTEtan (CEFOTAN) 2 g in dextrose 5 % 50 mL IVPB     2 g 100 mL/hr over 30 Minutes Intravenous On call to O.R. 03/26/15 0921 03/26/15 1133      Assessment/Plan: s/p Procedure(s): COLOSTOMY TAKEDOWN, PARTIAL COLECTOMY, BLADDER REPAIR, DIVERTING LOOP ILEOSTOMY CYSTOSCOPY WITH STENT PLACEMENT Advance diet Discontinue NGT.  Allow ice chips and water and sips with medications. Will get PT consultation. Dressing changes for midline wound. WBC count is slightly better, but CT imaging early postop may not be as helpful unless patient starts to have fevers or septic complications.  She is minimally tender.  LOS: 3 days    [name]. [name], MD, FACS [phone] [facility] Surgery 03/29/2015

## 2015-03-30 LAB — CBC WITH DIFFERENTIAL/PLATELET
BASOS PCT: 0 % (ref 0–1)
Basophils Absolute: 0 10*3/uL (ref 0.0–0.1)
EOS PCT: 0 % (ref 0–5)
Eosinophils Absolute: 0.1 10*3/uL (ref 0.0–0.7)
HEMATOCRIT: 27.7 % — AB (ref 36.0–46.0)
HEMOGLOBIN: 8.8 g/dL — AB (ref 12.0–15.0)
Lymphocytes Relative: 12 % (ref 12–46)
Lymphs Abs: 2.5 10*3/uL (ref 0.7–4.0)
MCH: 29.5 pg (ref 26.0–34.0)
MCHC: 31.8 g/dL (ref 30.0–36.0)
MCV: 93 fL (ref 78.0–100.0)
MONO ABS: 1.1 10*3/uL — AB (ref 0.1–1.0)
MONOS PCT: 6 % (ref 3–12)
NEUTROS ABS: 16.1 10*3/uL — AB (ref 1.7–7.7)
Neutrophils Relative %: 82 % — ABNORMAL HIGH (ref 43–77)
Platelets: 228 10*3/uL (ref 150–400)
RBC: 2.98 MIL/uL — ABNORMAL LOW (ref 3.87–5.11)
RDW: 14.4 % (ref 11.5–15.5)
WBC: 19.8 10*3/uL — ABNORMAL HIGH (ref 4.0–10.5)

## 2015-03-30 LAB — BASIC METABOLIC PANEL
Anion gap: 6 (ref 5–15)
BUN: 13 mg/dL (ref 6–20)
CO2: 26 mmol/L (ref 22–32)
CREATININE: 0.65 mg/dL (ref 0.44–1.00)
Calcium: 8.7 mg/dL — ABNORMAL LOW (ref 8.9–10.3)
Chloride: 107 mmol/L (ref 101–111)
Glucose, Bld: 149 mg/dL — ABNORMAL HIGH (ref 70–99)
Potassium: 3.6 mmol/L (ref 3.5–5.1)
Sodium: 139 mmol/L (ref 135–145)

## 2015-03-30 LAB — GLUCOSE, CAPILLARY
GLUCOSE-CAPILLARY: 144 mg/dL — AB (ref 70–99)
GLUCOSE-CAPILLARY: 117 mg/dL — AB (ref 70–99)
Glucose-Capillary: 149 mg/dL — ABNORMAL HIGH (ref 70–99)
Glucose-Capillary: 137 mg/dL — ABNORMAL HIGH (ref 70–99)

## 2015-03-30 MED ORDER — ALPRAZOLAM 0.25 MG PO TABS: 0.1250 mg | ORAL_TABLET | Freq: Every evening | ORAL | Status: DC | PRN

## 2015-03-30 MED ORDER — WHITE PETROLATUM GEL
Status: AC
  Filled 2015-03-30: qty 1

## 2015-03-30 MED ORDER — ANASTROZOLE 1 MG PO TABS
1.0000 mg | ORAL_TABLET | Freq: Every day | ORAL | Status: DC
  Administered 2015-03-30 – 2015-04-03 (×5): 1 mg via ORAL
  Filled 2015-03-30 (×5): qty 1

## 2015-03-30 NOTE — Progress Notes (Signed)
GS Progress Note Subjective: Patient feeling better.  Less discomfort.    Objective: Vital signs in last 24 hours: Temp:  [97.2 F (36.2 C)-98.7 F (37.1 C)] 98.7 F (37.1 C) (05/03 0320) Pulse Rate:  [80-108] 94 (05/03 0325) Resp:  [12-20] 12 (05/03 0400) BP: (136-166)/(65-85) 166/84 mmHg (05/03 0325) SpO2:  [93 %-96 %] 94 % (05/03 0400)    Intake/Output from previous day: 05/02 0701 - 05/03 0700 In: 625 [I.V.:625] Out: 1146 [Urine:975; Drains:171] Intake/Output this shift:    Lungs: Clear to auscultation  Abd: Good bowel sounds.  Small amount of bilious output from ileostomy.  Wounds are okay.  Extremities: No clinical signs or symptoms of DVT  Neuro: Intact  Lab Results: CBC   Recent Labs  03/29/15 0245 03/30/15 0322  WBC 22.5* 19.8*  HGB 8.9* 8.8*  HCT 27.8* 27.7*  PLT 214 228   BMET  Recent Labs  03/29/15 0245 03/30/15 0322  NA 137 139  K 3.9 3.6  CL 106 107  CO2 25 26  GLUCOSE 156* 149*  BUN 14 13  CREATININE 0.98 0.65  CALCIUM 8.4* 8.7*   PT/INR No results for input(s): LABPROT, INR in the last 72 hours. ABG No results for input(s): PHART, HCO3 in the last 72 hours.  Invalid input(s): PCO2, PO2  Studies/Results: No results found.  Anti-infectives: Anti-infectives    Start     Dose/Rate Route Frequency Ordered Stop   03/26/15 2200  cefoTEtan (CEFOTAN) 1 g in dextrose 5 % 50 mL IVPB     1 g 100 mL/hr over 30 Minutes Intravenous Every 12 hours 03/26/15 1612 03/27/15 1255   03/26/15 0921  neomycin (MYCIFRADIN) tablet 1,000 mg  Status:  Discontinued     1,000 mg Oral 3 times per day 03/26/15 0921 03/26/15 0927   03/26/15 0921  erythromycin (E-MYCIN) tablet 1,000 mg  Status:  Discontinued     1,000 mg Oral 3 times per day 03/26/15 7588 03/26/15 0927   03/26/15 0921  cefoTEtan (CEFOTAN) 2 g in dextrose 5 % 50 mL IVPB     2 g 100 mL/hr over 30 Minutes Intravenous On call to O.R. 03/26/15 0921 03/26/15 1133      Assessment/Plan: s/p  Procedure(s): COLOSTOMY TAKEDOWN, PARTIAL COLECTOMY, BLADDER REPAIR, DIVERTING LOOP ILEOSTOMY CYSTOSCOPY WITH STENT PLACEMENT  WBC down.  No fever. Advance diet Continue foley due to bladder repair Transfer to the floor  LOS: 4 days    Michele Palmer. Michele Bailiff, MD, FACS [phone] [facility] Surgery 03/30/2015

## 2015-03-30 NOTE — Progress Notes (Signed)
Physical Therapy Treatment Patient Details Name: Michele Palmer MRN: [id number] DOB: 11/23/1936 Today's Date: 03/30/2015    History of Present Illness Michele Palmer has presented today for surgery, with the diagnosis of diverticular perforation with abscess and fistula,  S/P COLOSTOMY TAKEDOWN, PARTIAL COLECTOMY WITH ANASTOMOSIS, BLADDER REPAIR, DIVERTING LOOP ILEOSTOMY    PT Comments    Progressing slowly, limited by pain mostly, but pt needs extra encouragement.  Follow Up Recommendations  Home health PT;Supervision for mobility/OOB     Equipment Recommendations  None recommended by PT    Recommendations for Other Services       Precautions / Restrictions Precautions Precautions: Fall    Mobility  Bed Mobility Overal bed mobility: Needs Assistance Bed Mobility: Rolling;Sidelying to Sit Rolling: Min assist Sidelying to sit: Min assist       General bed mobility comments: cues for technique  Transfers Overall transfer level: Needs assistance Equipment used: 1 person hand held assist Transfers: Sit to/from Stand Sit to Stand: Min assist         General transfer comment: cues for hand placement  Ambulation/Gait Ambulation/Gait assistance: Min guard;Min assist Ambulation Distance (Feet): 140 Feet Assistive device: Rolling walker (2 wheeled) Gait Pattern/deviations: Step-through pattern Gait velocity: slow and guarded Gait velocity interpretation: Below normal speed for age/gender General Gait Details: slow slow and guarded steps   Stairs            Wheelchair Mobility    Modified Rankin (Stroke Patients Only)       Balance Overall balance assessment: Needs assistance Sitting-balance support: No upper extremity supported Sitting balance-Leahy Scale: Fair     Standing balance support: Single extremity supported;No upper extremity supported Standing balance-Leahy Scale: Fair                      Cognition Arousal/Alertness:  Awake/alert Behavior During Therapy: WFL for tasks assessed/performed Overall Cognitive Status: Within Functional Limits for tasks assessed                      Exercises      General Comments General comments (skin integrity, edema, etc.): Discussion of importance of getting up frequently to decrease blood clots, and improve respiratory status.  Oxygen maintained  low 90's , but EHR increased into the 130's      Pertinent Vitals/Pain Pain Assessment: Faces Faces Pain Scale: Hurts even more Pain Location: stomach Pain Descriptors / Indicators: Sore Pain Intervention(s): Repositioned    Home Living                      Prior Function            PT Goals (current goals can now be found in the care plan section) Acute Rehab PT Goals Patient Stated Goal: mobilizing more easily PT Goal Formulation: With patient Time For Goal Achievement: 04/10/15 Potential to Achieve Goals: Good Progress towards PT goals: Progressing toward goals    Frequency  Min 3X/week    PT Plan Current plan remains appropriate    Co-evaluation             End of Session   Activity Tolerance: Patient tolerated treatment well;Other (comment) (self limiting) Patient left: in chair;with call bell/phone within reach;with family/visitor present     Time: 3662-9476 PT Time Calculation (min) (ACUTE ONLY): 26 min  Charges:  $Gait Training: 8-22 mins $Therapeutic Activity: 8-22 mins  G Codes:      Evonna Stoltz, Tessie Fass 03/30/2015, 5:45 PM 03/30/2015  Donnella Sham, PT [phone]  (pager)

## 2015-03-30 NOTE — Care Management Note (Signed)
Case Management Note  Patient Details  Name: Michele Palmer MRN: [id number] Date of Birth: 06-27-1936  Subjective/Objective:                    Action/Plan:   Expected Discharge Date:  04/01/15       Consult for ostomy supplies . WOC note :  Patient is enrolled  in [employer] Discharge program.   MD please order home health RN for ostomy care . Thanks  Donnie Coffin BSN             Expected Discharge Plan:  [place]  In-House Referral:     Discharge planning Services  CM Consult  Post Acute Care Choice:    Choice offered to:  Adult Children (advance home care services for [facility] needs @ discharge per daughter Shelton Silvas [phone])  DME Arranged:    DME Agency:     HH Arranged:    HH Agency:     Status of Service:  In process, will continue to follow  Medicare Important Message Given:  Yes Date Medicare IM Given:  03/29/15 Medicare IM give by:  Whitman Hero RN Date Additional Medicare IM Given:    Additional Medicare Important Message give by:     If discussed at [place] of Stay Meetings, dates discussed:    Additional Comments:  [name], RN 03/30/2015, 3:51 PM

## 2015-03-30 NOTE — Progress Notes (Signed)
Patient ID: Michele Palmer, female   DOB: 16-May-1936, 79 y.o.   MRN: [id number] 4 Days Post-Op Subjective: She reports today she is beginning to feel better.  She is not having any further left sided back pain.  She has been up to the chair and is ambulating short distances.  Objective: Vital signs in last 24 hours: Temp:  [97.2 F (36.2 C)-98.7 F (37.1 C)] 98.7 F (37.1 C) (05/03 0320) Pulse Rate:  [80-108] 94 (05/03 0325) Resp:  [12-20] 12 (05/03 0400) BP: (136-166)/(65-85) 166/84 mmHg (05/03 0325) SpO2:  [93 %-96 %] 94 % (05/03 0400)  Intake/Output from previous day: 05/02 0701 - 05/03 0700 In: 625 [I.V.:625] Out: 1146 [Urine:975; Drains:171] Intake/Output this shift:    Past Medical History  Diagnosis Date  . Anemia   . Arthritis   . Cancer   . GERD (gastroesophageal reflux disease)   . Anxiety   . Depression   . Breast cancer, right breast 07/04/2013    ILC, 1.8 cm, neg sln receptor+, surgery on 08/01/13   . Hypertension   . Diabetes mellitus without complication     pt stated she is no longer diabetic and is not required to take any glucose meds  . Colovesical fistula    Current Facility-Administered Medications  Medication Dose Route Frequency Provider Last Rate Last Dose  . antiseptic oral rinse (CPC / CETYLPYRIDINIUM CHLORIDE 0.05%) solution 7 mL  7 mL Mouth Rinse q12n4p Judeth Horn, MD   7 mL at 03/29/15 1653  . chlorhexidine (PERIDEX) 0.12 % solution 15 mL  15 mL Mouth Rinse BID Judeth Horn, MD   15 mL at 03/29/15 2244  . dextrose 5 %-0.9 % sodium chloride infusion   Intravenous Continuous Ralene Ok, MD 125 mL/hr at 03/30/15 0403    . diphenhydrAMINE (BENADRYL) injection 12.5 mg  12.5 mg Intravenous Q6H PRN Judeth Horn, MD       Or  . diphenhydrAMINE (BENADRYL) 12.5 MG/5ML elixir 12.5 mg  12.5 mg Oral Q6H PRN Judeth Horn, MD      . enoxaparin (LOVENOX) injection 40 mg  40 mg Subcutaneous Q24H Judeth Horn, MD   40 mg at 03/29/15 1248  . HYDROmorphone (DILAUDID)  PCA injection 0.3 mg/mL   Intravenous 6 times per day Judeth Horn, MD      . insulin aspart (novoLOG) injection 0-15 Units  0-15 Units Subcutaneous TID WC Ralene Ok, MD   3 Units at 03/29/15 1652  . metoprolol (LOPRESSOR) injection 5 mg  5 mg Intravenous Q4H PRN Georganna Skeans, MD   5 mg at 03/29/15 1426  . naloxone [facility]) injection 0.4 mg  0.4 mg Intravenous PRN Judeth Horn, MD       And  . sodium chloride 0.9 % injection 9 mL  9 mL Intravenous PRN Judeth Horn, MD      . ondansetron [facility]) injection 4 mg  4 mg Intravenous Q6H PRN Judeth Horn, MD   4 mg at 03/30/15 0414  . polymixin-bacitracin (POLYSPORIN) ointment   Topical BID Judeth Horn, MD        Physical Exam:  General: Patient is in no apparent distress Lungs: Normal respiratory effort, chest expands symmetrically. GI: The abdomen is soft With dry, intact dressings GU: Foley catheter is draining fairly clear urine.   Lab Results:  Recent Labs  03/28/15 0329 03/29/15 0245 03/30/15 0322  WBC 23.1* 22.5* 19.8*  HGB 10.2* 8.9* 8.8*  HCT 32.1* 27.8* 27.7*   BMET  Recent Labs  03/29/15  0245 03/30/15 0322  NA 137 139  K 3.9 3.6  CL 106 107  CO2 25 26  GLUCOSE 156* 149*  BUN 14 13  CREATININE 0.98 0.65  CALCIUM 8.4* 8.7*   No results for input(s): LABPT, INR in the last 72 hours. No results for input(s): LABURIN in the last 72 hours. Results for orders placed or performed during the hospital encounter of 03/26/15  MRSA PCR Screening     Status: None   Collection Time: 03/26/15  6:55 PM  Result Value Ref Range Status   MRSA by PCR NEGATIVE NEGATIVE Final    Comment:        The GeneXpert MRSA Assay (FDA approved for NASAL specimens only), is one component of a comprehensive MRSA colonization surveillance program. It is not intended to diagnose MRSA infection nor to guide or monitor treatment for MRSA infections.     Studies/Results: No results found.  Assessment/Plan: She appears to be  progressing with clearance of her urine, a decreased white blood cell count and less discomfort in her back and abdomen. Her drain output remained stable and is likely just peritoneal fluid from the raw surfaces.  Her current Foley catheter will remain indwelling. Continue Blake drainage.  Tahmid Stonehocker C 03/30/2015, 7:07 AM

## 2015-03-31 LAB — CBC WITH DIFFERENTIAL/PLATELET
BASOS ABS: 0 10*3/uL (ref 0.0–0.1)
BASOS PCT: 0 % (ref 0–1)
EOS PCT: 2 % (ref 0–5)
Eosinophils Absolute: 0.3 10*3/uL (ref 0.0–0.7)
HCT: 27.8 % — ABNORMAL LOW (ref 36.0–46.0)
HEMOGLOBIN: 8.8 g/dL — AB (ref 12.0–15.0)
LYMPHS PCT: 13 % (ref 12–46)
Lymphs Abs: 2 10*3/uL (ref 0.7–4.0)
MCH: 29.7 pg (ref 26.0–34.0)
MCHC: 31.7 g/dL (ref 30.0–36.0)
MCV: 93.9 fL (ref 78.0–100.0)
MONO ABS: 1.3 10*3/uL — AB (ref 0.1–1.0)
Monocytes Relative: 9 % (ref 3–12)
NEUTROS ABS: 11.9 10*3/uL — AB (ref 1.7–7.7)
Neutrophils Relative %: 76 % (ref 43–77)
Platelets: 237 10*3/uL (ref 150–400)
RBC: 2.96 MIL/uL — ABNORMAL LOW (ref 3.87–5.11)
RDW: 14.3 % (ref 11.5–15.5)
WBC: 15.5 10*3/uL — AB (ref 4.0–10.5)

## 2015-03-31 LAB — GLUCOSE, CAPILLARY
GLUCOSE-CAPILLARY: 129 mg/dL — AB (ref 70–99)
GLUCOSE-CAPILLARY: 117 mg/dL — AB (ref 70–99)
Glucose-Capillary: 113 mg/dL — ABNORMAL HIGH (ref 70–99)
Glucose-Capillary: 135 mg/dL — ABNORMAL HIGH (ref 70–99)

## 2015-03-31 LAB — BASIC METABOLIC PANEL
ANION GAP: 10 (ref 5–15)
BUN: 9 mg/dL (ref 6–20)
CALCIUM: 8.6 mg/dL — AB (ref 8.9–10.3)
CO2: 24 mmol/L (ref 22–32)
Chloride: 103 mmol/L (ref 101–111)
Creatinine, Ser: 0.45 mg/dL (ref 0.44–1.00)
Glucose, Bld: 111 mg/dL — ABNORMAL HIGH (ref 70–99)
Potassium: 3.5 mmol/L (ref 3.5–5.1)
SODIUM: 137 mmol/L (ref 135–145)

## 2015-03-31 MED ORDER — BOOST / RESOURCE BREEZE PO LIQD
1.0000 | Freq: Three times a day (TID) | ORAL | Status: DC
  Administered 2015-03-31 – 2015-04-03 (×9): 1 via ORAL

## 2015-03-31 MED ORDER — HYDROMORPHONE HCL 1 MG/ML IJ SOLN
0.5000 mg | INTRAMUSCULAR | Status: DC | PRN
  Administered 2015-03-31: 0.5 mg via INTRAVENOUS
  Administered 2015-04-02: 1 mg via INTRAVENOUS
  Filled 2015-03-31 (×2): qty 1

## 2015-03-31 MED ORDER — ADULT MULTIVITAMIN W/MINERALS CH
1.0000 | ORAL_TABLET | Freq: Every day | ORAL | Status: DC
  Administered 2015-03-31 – 2015-04-03 (×4): 1 via ORAL
  Filled 2015-03-31 (×4): qty 1

## 2015-03-31 MED ORDER — OXYCODONE-ACETAMINOPHEN 5-325 MG PO TABS
1.0000 | ORAL_TABLET | ORAL | Status: DC | PRN
  Administered 2015-03-31 (×3): 1 via ORAL
  Administered 2015-04-01: 2 via ORAL
  Administered 2015-04-01 (×2): 1 via ORAL
  Administered 2015-04-02: 2 via ORAL
  Administered 2015-04-02 – 2015-04-03 (×2): 1 via ORAL
  Filled 2015-03-31: qty 1
  Filled 2015-03-31: qty 2
  Filled 2015-03-31: qty 1
  Filled 2015-03-31 (×2): qty 2
  Filled 2015-03-31: qty 1
  Filled 2015-03-31 (×3): qty 2

## 2015-03-31 NOTE — Progress Notes (Signed)
5 Days Post-Op Subjective: Patient reports having tolerated a clear liquid diet yesterday.  She had a little bit of mild nausea this morning but since she woke up hungry.  Objective: Vital signs in last 24 hours: Temp:  [98.2 F (36.8 C)-98.5 F (36.9 C)] 98.4 F (36.9 C) (05/04 0640) Pulse Rate:  [82-94] 90 (05/04 0640) Resp:  [12-17] 12 (05/04 0818) BP: (141-161)/(68-81) 156/81 mmHg (05/04 0640) SpO2:  [91 %-96 %] 96 % (05/04 0818)  Intake/Output from previous day: 05/03 0701 - 05/04 0700 In: 5090.8 [P.O.:320; I.V.:4770.8] Out: 1170 [Urine:1050; Drains:120] Intake/Output this shift: Total I/O In: -  Out: 335 [Drains:10; Stool:325]  Physical Exam:  Abdomen is flat and soft Catheter is draining and urine is essentially clear  Lab Results:  Recent Labs  03/29/15 0245 03/30/15 0322 03/31/15 0455  HGB 8.9* 8.8* 8.8*  HCT 27.8* 27.7* 27.8*   BMET  Recent Labs  03/30/15 0322 03/31/15 0455  NA 139 137  K 3.6 3.5  CL 107 103  CO2 26 24  GLUCOSE 149* 111*  BUN 13 9  CREATININE 0.65 0.45  CALCIUM 8.7* 8.6*   No results for input(s): LABPT, INR in the last 72 hours. No results for input(s): LABURIN in the last 72 hours. Results for orders placed or performed during the hospital encounter of 03/26/15  MRSA PCR Screening     Status: None   Collection Time: 03/26/15  6:55 PM  Result Value Ref Range Status   MRSA by PCR NEGATIVE NEGATIVE Final    Comment:        The GeneXpert MRSA Assay (FDA approved for NASAL specimens only), is one component of a comprehensive MRSA colonization surveillance program. It is not intended to diagnose MRSA infection nor to guide or monitor treatment for MRSA infections.     Studies/Results: No results found.  Assessment/Plan: She is progressing nicely.  Her diet has been advanced and she is tolerating this. Her white blood cell count has continued to decrease and she has not had any fever. Her drain output has diminished  significantly.   Her drain could be removed from a urologic standpoint.  Continue Foley catheter drainage.   LOS: 5 days   Harlei Lehrmann C 03/31/2015, 8:52 AM

## 2015-03-31 NOTE — Progress Notes (Signed)
Nutrition Follow-up  DOCUMENTATION CODES:  Obesity unspecified  INTERVENTION:  Boost Breeze, MVI  NUTRITION DIAGNOSIS:  Predicted suboptimal nutrient intake related to altered GI function, other (see comment) (decreased appetite) as evidenced by meal completion < 50%; ongoing  GOAL:  Patient will meet greater than or equal to 90% of their needs Unmet  MONITOR:  PO intake, Diet advancement, Supplement acceptance, Weight trends, Labs  REASON FOR ASSESSMENT:  Malnutrition Screening Tool    ASSESSMENT:  79 year old female with history of GERD, HTN, diabetes without complication, and multiple myeloma presens for surgery, with the diagnosis of diverticular perforation with abscess and fistula.  s/p Procedure(s): COLOSTOMY TAKEDOWN, PARTIAL COLECTOMY, BLADDER REPAIR, DIVERTING LOOP ILEOSTOMY (N/A) CYSTOSCOPY WITH STENT PLACEMENT (N/A)  NGT was d/c on 03/28/14.  Pt has been advanced to a full liquid diet this morning. She reports she is tolerating liquids well, however, complains of poor appetite. She reports she drank cranberry juice for dinner and consumed a half carton of milk and a few bites of ice cream for breakfast, Meal completion 20-25%.  Pt is requesting Resource Breeze supplement- RD to order. Reinforced importance of consuming meals and supplements to promote healing.  Pt with RLQ ileostomy; noted 325 ml output within the past 24 hours per doc flowsheets. RLQ drain with 115 ml output within the past 24 hours per doc flowsheets.  Anticipated d/c on 04/02/15 or 04/03/15.  Labs reviewed. Calcium: 8.6, Glucose: 111.  Height:  Ht Readings from Last 1 Encounters:  03/26/15 5' 3"  (1.6 m)    Weight:  Wt Readings from Last 1 Encounters:  03/27/15 183 lb 6.8 oz (83.2 kg)    Ideal Body Weight:  52 kg  Wt Readings from Last 10 Encounters:  03/27/15 183 lb 6.8 oz (83.2 kg)  02/05/15 199 lb 11.2 oz (90.583 kg)  08/25/14 219 lb (99.338 kg)  02/09/14 220 lb 4.8 oz (99.927  kg)  11/10/13 213 lb 8 oz (96.843 kg)  09/22/13 211 lb 4.8 oz (95.845 kg)  09/02/13 209 lb 9.6 oz (95.074 kg)  08/19/13 210 lb 3.2 oz (95.346 kg)  08/01/13 208 lb (94.348 kg)  07/04/13 205 lb 12.8 oz (93.35 kg)    BMI:  Body mass index is 32.5 kg/(m^2). Obesity, class I  Estimated Nutritional Needs:  Kcal:  1700-1900  Protein:  90-100 grams  Fluid:  1.9 L/day  Skin:  Wound (see comment) (closed abdominal incision, RLQ ileostomy)  Diet Order:  Diet full liquid Room service appropriate?: Yes; Fluid consistency:: Thin  EDUCATION NEEDS:  Education needs addressed   Intake/Output Summary (Last 24 hours) at 03/31/15 1033 Last data filed at 03/31/15 0829  Gross per 24 hour  Intake 4890.83 ml  Output   1215 ml  Net 3675.83 ml    Last BM: PTA-  325 ml ileostomy output in the past 24 hours  Porcha Deblanc A. Jimmye Norman, RD, LDN, CDE Pager: [phone] After hours Pager: [phone]

## 2015-03-31 NOTE — Progress Notes (Signed)
GS Progress Note Subjective: Patient doing much better.  Ileostomy is functioning well.  Mild occasional nausea  Objective: Vital signs in last 24 hours: Temp:  [98.2 F (36.8 C)-98.5 F (36.9 C)] 98.4 F (36.9 C) (05/04 0640) Pulse Rate:  [82-94] 90 (05/04 0640) Resp:  [12-17] 12 (05/04 0818) BP: (141-161)/(68-81) 156/81 mmHg (05/04 0640) SpO2:  [91 %-96 %] 96 % (05/04 0818)    Intake/Output from previous day: 05/03 0701 - 05/04 0700 In: 5090.8 [P.O.:320; I.V.:4770.8] Out: 1170 [Urine:1050; Drains:120] Intake/Output this shift: Total I/O In: -  Out: 335 [Drains:10; Stool:325]  Lungs: Clear to auscultation.  Sats drop when she sleeps, setting off the PCA alarms.  Abd: Soft, good bowel sounds.  Bilious output from ileostomy  Extremities: No DVT sign or symptoms.  Neuro: Intact  Lab Results: CBC   Recent Labs  03/30/15 0322 03/31/15 0455  WBC 19.8* 15.5*  HGB 8.8* 8.8*  HCT 27.7* 27.8*  PLT 228 237   BMET  Recent Labs  03/30/15 0322 03/31/15 0455  NA 139 137  K 3.6 3.5  CL 107 103  CO2 26 24  GLUCOSE 149* 111*  BUN 13 9  CREATININE 0.65 0.45  CALCIUM 8.7* 8.6*   PT/INR No results for input(s): LABPROT, INR in the last 72 hours. ABG No results for input(s): PHART, HCO3 in the last 72 hours.  Invalid input(s): PCO2, PO2  Studies/Results: No results found.  Anti-infectives: Anti-infectives    Start     Dose/Rate Route Frequency Ordered Stop   03/26/15 2200  cefoTEtan (CEFOTAN) 1 g in dextrose 5 % 50 mL IVPB     1 g 100 mL/hr over 30 Minutes Intravenous Every 12 hours 03/26/15 1612 03/27/15 1255   03/26/15 0921  neomycin (MYCIFRADIN) tablet 1,000 mg  Status:  Discontinued     1,000 mg Oral 3 times per day 03/26/15 0921 03/26/15 0927   03/26/15 0921  erythromycin (E-MYCIN) tablet 1,000 mg  Status:  Discontinued     1,000 mg Oral 3 times per day 03/26/15 0737 03/26/15 0927   03/26/15 0921  cefoTEtan (CEFOTAN) 2 g in dextrose 5 % 50 mL IVPB     2 g 100 mL/hr over 30 Minutes Intravenous On call to O.R. 03/26/15 0921 03/26/15 1133      Assessment/Plan: s/p Procedure(s): COLOSTOMY TAKEDOWN, PARTIAL COLECTOMY, BLADDER REPAIR, DIVERTING LOOP ILEOSTOMY CYSTOSCOPY WITH STENT PLACEMENT Advance diet Continue foley due to Bladder repair At this rate the patient will probably be able to go home on Saturday, maybe Friday.  Pathology completely benign.  LOS: 5 days    [name]. [name], MD, FACS [phone] [facility] 03/31/2015

## 2015-03-31 NOTE — Consult Note (Signed)
WOC ostomy follow up Stoma type/location:  RLQ, end ileostomy Stomal assessment/size: 1 3/4" round, appears slightly budded  Peristomal assessment: pt wishes to wait for her daughter in law who has been providing ostomy care at home previously to perform first pouch change in the am.   Treatment options for stomal/peristomal skin: pouch intact Output  Some early output, dark, black, green effluent Ostomy pouching: 2pc.  Education provided: none today, patient wishes to have daughter in law present.  Enrolled patient in [place] Start Discharge program: Yes from previous surgery  Son in the room at the time of my visit today, however he does not assist with this patients care at home.  Planned visit with patient and her daughter in law in the am at 7-730.   [place] team will follow along with you for ostomy education and support.  Tziporah Knoke [place] RN,CWOCN [phone]

## 2015-04-01 LAB — GLUCOSE, CAPILLARY
GLUCOSE-CAPILLARY: 143 mg/dL — AB (ref 70–99)
Glucose-Capillary: 125 mg/dL — ABNORMAL HIGH (ref 70–99)
Glucose-Capillary: 117 mg/dL — ABNORMAL HIGH (ref 70–99)

## 2015-04-01 MED ORDER — LOSARTAN POTASSIUM 50 MG PO TABS
100.0000 mg | ORAL_TABLET | Freq: Every day | ORAL | Status: DC
  Administered 2015-04-01 – 2015-04-03 (×3): 100 mg via ORAL
  Filled 2015-04-01 (×3): qty 2

## 2015-04-01 MED ORDER — ONDANSETRON HCL 4 MG/2ML IJ SOLN
4.0000 mg | INTRAMUSCULAR | Status: DC | PRN
  Administered 2015-04-01 – 2015-04-02 (×2): 4 mg via INTRAVENOUS
  Filled 2015-04-01 (×2): qty 2

## 2015-04-01 MED ORDER — MAGNESIUM HYDROXIDE 400 MG/5ML PO SUSP
30.0000 mL | Freq: Every day | ORAL | Status: DC | PRN
  Administered 2015-04-01: 30 mL via ORAL
  Filled 2015-04-01: qty 30

## 2015-04-01 MED ORDER — CITALOPRAM HYDROBROMIDE 20 MG PO TABS
10.0000 mg | ORAL_TABLET | Freq: Every day | ORAL | Status: DC
  Administered 2015-04-01 – 2015-04-02 (×2): 10 mg via ORAL
  Filled 2015-04-01 (×2): qty 1

## 2015-04-01 NOTE — Consult Note (Signed)
WOC ostomy follow up Stoma type/location: RLQ, end ileostomy Stomal assessment/size: 1/3/8" x 1 3/4" slightly oval stoma, slightly budded with mild dip in the skin at 7-8 o'clock   Peristomal assessment: intact Treatment options for stomal/peristomal skin: added 2" barrier ring for aid in seal with some abdominal contour issues and with ileostomy output Output liquid green Ostomy pouching: 1pc. With 2" barrier ring Education provided:  Discussed major differences in colostomy vs. Ileostomy.  Stressed hydration needs, diet, and medication alteration if needed based on less transit time in the bowel.  Her daughter in law is at the bedside for the education.  Will reassess patient in the am to determine if the current pouch will be effective this time for her.  The daughter in law is very comfortable with pouching her previous stoma and will continue to support her with this care.  Enrolled patient in [place] Discharge program: Yes, previously.  Will contact [place] with any new needs after we determine effective pouching system.   [place] team will follow along with you for ostomy care and teaching. Preet Perrier [place] RN,CWOCN [phone]

## 2015-04-01 NOTE — Progress Notes (Signed)
Patient ID: Michele Palmer, female   DOB: 1936/11/25, 79 y.o.   MRN: [id number]     [place]      [address] [place], [place] [address]    Phone: [phone] FAX: [phone]     Subjective: Sat up for 3 hours, now sore.  Walked. Tolerated fulls.  Appetite just okay.  Foley still with feculent output.  VSS.  Afebrile.    Objective:  Vital signs:  Filed Vitals:   03/31/15 1000 03/31/15 1400 03/31/15 2151 04/01/15 0658  BP: 155/83 153/79 142/67 146/63  Pulse: 89 79 98 86  Temp: 98.1 F (36.7 C) 98.1 F (36.7 C) 98.7 F (37.1 C) 98.5 F (36.9 C)  TempSrc: Oral Oral Oral   Resp: 14 14 14 15   Height:      Weight:      SpO2: 96% 96% 92% 93%    Last BM Date: 04/01/15  Intake/Output   Yesterday:  05/04 0701 - 05/05 0700 In: 2142.9 [P.O.:240; I.V.:1902.9] Out: 1545 [Urine:900; Drains:120; Stool:525] This shift:    I/O last 3 completed shifts: In: 35.1 [P.O.:240; I.V.:6817.1] Out: 2210 [SMOLM:7867; Drains:210; Stool:525]    Physical Exam: General: Pt awake/alert/oriented x4 in no acute distress Chest: cta. No chest wall pain w good excursion CV:  Pulses intact.  Regular rhythm MS: Normal AROM mjr joints.  No obvious deformity Abdomen: Soft.  Nondistended.   Mildly tender at incisions only.  Midline wound is c/d/i.  jp drain with serosanguinous output.  Ileostomy functioning.  No evidence of peritonitis.  No incarcerated hernias. Ext:  SCDs BLE.  No mjr edema.  No cyanosis Skin: No petechiae / purpura   Problem List:   Active Problems:   Diverticulitis large intestine    Results:   Labs: Results for orders placed or performed during the hospital encounter of 03/26/15 (from the past 48 hour(s))  Glucose, capillary     Status: Abnormal   Collection Time: 03/30/15  5:06 PM  Result Value Ref Range   Glucose-Capillary 144 (H) 70 - 99 mg/dL  Glucose, capillary     Status: Abnormal   Collection Time: 03/30/15   9:57 PM  Result Value Ref Range   Glucose-Capillary 117 (H) 70 - 99 mg/dL   Comment 1 Notify RN   CBC with Differential/Platelet     Status: Abnormal   Collection Time: 03/31/15  4:55 AM  Result Value Ref Range   WBC 15.5 (H) 4.0 - 10.5 K/uL   RBC 2.96 (L) 3.87 - 5.11 MIL/uL   Hemoglobin 8.8 (L) 12.0 - 15.0 g/dL   HCT 27.8 (L) 36.0 - 46.0 %   MCV 93.9 78.0 - 100.0 fL   MCH 29.7 26.0 - 34.0 pg   MCHC 31.7 30.0 - 36.0 g/dL   RDW 14.3 11.5 - 15.5 %   Platelets 237 150 - 400 K/uL   Neutrophils Relative % 76 43 - 77 %   Neutro Abs 11.9 (H) 1.7 - 7.7 K/uL   Lymphocytes Relative 13 12 - 46 %   Lymphs Abs 2.0 0.7 - 4.0 K/uL   Monocytes Relative 9 3 - 12 %   Monocytes Absolute 1.3 (H) 0.1 - 1.0 K/uL   Eosinophils Relative 2 0 - 5 %   Eosinophils Absolute 0.3 0.0 - 0.7 K/uL   Basophils Relative 0 0 - 1 %   Basophils Absolute 0.0 0.0 - 0.1 K/uL  Basic metabolic panel     Status: Abnormal  Collection Time: 03/31/15  4:55 AM  Result Value Ref Range   Sodium 137 135 - 145 mmol/L   Potassium 3.5 3.5 - 5.1 mmol/L   Chloride 103 101 - 111 mmol/L   CO2 24 22 - 32 mmol/L   Glucose, Bld 111 (H) 70 - 99 mg/dL   BUN 9 6 - 20 mg/dL   Creatinine, Ser 0.45 0.44 - 1.00 mg/dL   Calcium 8.6 (L) 8.9 - 10.3 mg/dL   GFR calc non Af Amer >60 >60 mL/min   GFR calc Af Amer >60 >60 mL/min    Comment: (NOTE) The eGFR has been calculated using the CKD EPI equation. This calculation has not been validated in all clinical situations. eGFR's persistently <90 mL/min signify possible Chronic Kidney Disease.    Anion gap 10 5 - 15  Glucose, capillary     Status: Abnormal   Collection Time: 03/31/15  8:04 AM  Result Value Ref Range   Glucose-Capillary 129 (H) 70 - 99 mg/dL  Glucose, capillary     Status: Abnormal   Collection Time: 03/31/15 12:21 PM  Result Value Ref Range   Glucose-Capillary 117 (H) 70 - 99 mg/dL  Glucose, capillary     Status: Abnormal   Collection Time: 03/31/15  5:39 PM  Result  Value Ref Range   Glucose-Capillary 113 (H) 70 - 99 mg/dL  Glucose, capillary     Status: Abnormal   Collection Time: 03/31/15  9:50 PM  Result Value Ref Range   Glucose-Capillary 135 (H) 70 - 99 mg/dL   Comment 1 Notify RN    Comment 2 Document in Chart   Glucose, capillary     Status: Abnormal   Collection Time: 04/01/15  8:04 AM  Result Value Ref Range   Glucose-Capillary 125 (H) 70 - 99 mg/dL   Comment 1 Notify RN   Glucose, capillary     Status: Abnormal   Collection Time: 04/01/15 12:13 PM  Result Value Ref Range   Glucose-Capillary 117 (H) 70 - 99 mg/dL   Comment 1 Notify RN     Imaging / Studies: No results found.  Medications / Allergies:  Scheduled Meds: . anastrozole  1 mg Oral Daily  . enoxaparin (LOVENOX) injection  40 mg Subcutaneous Q24H  . feeding supplement (RESOURCE BREEZE)  1 Container Oral TID BM  . insulin aspart  0-15 Units Subcutaneous TID WC  . multivitamin with minerals  1 tablet Oral Daily  . polymixin-bacitracin   Topical BID   Continuous Infusions: . dextrose 5 % and 0.9% NaCl 75 mL/hr at 04/01/15 0603   PRN Meds:.ALPRAZolam, HYDROmorphone (DILAUDID) injection, magnesium hydroxide, metoprolol, ondansetron (ZOFRAN) IV, oxyCODONE-acetaminophen  Antibiotics: Anti-infectives    Start     Dose/Rate Route Frequency Ordered Stop   03/26/15 2200  cefoTEtan (CEFOTAN) 1 g in dextrose 5 % 50 mL IVPB     1 g 100 mL/hr over 30 Minutes Intravenous Every 12 hours 03/26/15 1612 03/27/15 1255   03/26/15 0921  neomycin (MYCIFRADIN) tablet 1,000 mg  Status:  Discontinued     1,000 mg Oral 3 times per day 03/26/15 0921 03/26/15 0927   03/26/15 0921  erythromycin (E-MYCIN) tablet 1,000 mg  Status:  Discontinued     1,000 mg Oral 3 times per day 03/26/15 0921 03/26/15 0927   03/26/15 0921  cefoTEtan (CEFOTAN) 2 g in dextrose 5 % 50 mL IVPB     2 g 100 mL/hr over 30 Minutes Intravenous On call to O.R. 03/26/15 3329  03/26/15 1133         Assessment/Plan POD#6 colostomy takedown, partial colectomy, bladder repair, diverting loop ileostomy, cystoscopy with stent placement----Dr. Hulen Skains -WOC for teaching -Dressing change -Drain care -Foley to remain at discharge, follow up with Dr. Karsten Ro  -mobilize, up to chair -IS MMP-resume celexa and losartan  FEN-advance diet, encouraged PO intake, supplements.  PO pain meds, IV for breakthrough VTE prophylaxis-SCD/lovenox Dispo-anticipate DC 24-48h  Erby Pian, ANP-BC [facility] Surgery Pager [phone](7A-4:30P) For consults and floor pages call [phone](7A-4:30P)  04/01/2015 2:28 PM

## 2015-04-01 NOTE — Progress Notes (Signed)
Patient ID: Michele Palmer, female   DOB: [birth year], 79 y.o.   MRN: [id number] Doing well with no complaints.  Tolerating a regular diet.  Having ostomy output.  Afebrile Stable vital signs  Abdomen is flat and soft. Catheter is draining clear urine.  She is doing well.  From a urologic standpoint the drain has had markedly decreased output over the course of her hospitalization and can be removed when no longer needed by general surgery. She'll follow-up with me for cystogram.  We discussed the need to be discharged with a catheter in place.

## 2015-04-01 NOTE — Progress Notes (Signed)
Physical Therapy Treatment Patient Details Name: Michele Palmer MRN: [id number] DOB: 13-Sep-1936 Today's Date: 04/01/2015    History of Present Illness [name] has presented today for surgery, with the diagnosis of diverticular perforation with abscess and fistula,  S/P COLOSTOMY TAKEDOWN, PARTIAL COLECTOMY WITH ANASTOMOSIS, BLADDER REPAIR, DIVERTING LOOP ILEOSTOMY    PT Comments    Patient mobilizing well, performed mobility with min assist and ambulation with supervision. Patient has been ambulating with staff and family. Family very supportive and motivating. Current POC remains appropriate.  Follow Up Recommendations  Home health PT;Supervision for mobility/OOB     Equipment Recommendations  None recommended by PT    Recommendations for Other Services       Precautions / Restrictions Precautions Precautions: Fall Restrictions Weight Bearing Restrictions: No    Mobility  Bed Mobility Overal bed mobility: Needs Assistance Bed Mobility: Rolling;Sidelying to Sit Rolling: Min assist Sidelying to sit: Min assist       General bed mobility comments: cues for technique  Transfers Overall transfer level: Needs assistance Equipment used: Rolling walker (2 wheeled) Transfers: Sit to/from Stand Sit to Stand: Min assist         General transfer comment: VCs for hand positioning  Ambulation/Gait Ambulation/Gait assistance: Supervision Ambulation Distance (Feet): 280 Feet Assistive device: Rolling walker (2 wheeled) Gait Pattern/deviations: Step-through pattern Gait velocity: slow and guarded Gait velocity interpretation: Below normal speed for age/gender General Gait Details: decreased cadence, VCs for upright posture and increased gait speed, one standing rest break   Stairs            Wheelchair Mobility    Modified Rankin (Stroke Patients Only)       Balance   Sitting-balance support: No upper extremity supported Sitting balance-Leahy Scale:  Fair     Standing balance support: Bilateral upper extremity supported Standing balance-Leahy Scale: Fair                      Cognition Arousal/Alertness: Awake/alert Behavior During Therapy: WFL for tasks assessed/performed Overall Cognitive Status: Within Functional Limits for tasks assessed                      Exercises      General Comments        Pertinent Vitals/Pain Pain Assessment: Faces Faces Pain Scale: Hurts little more Pain Location: abdominal area Pain Descriptors / Indicators: Sore Pain Intervention(s): Repositioned    Home Living                      Prior Function            PT Goals (current goals can now be found in the care plan section) Acute Rehab PT Goals Patient Stated Goal: mobilizing more easily PT Goal Formulation: With patient Time For Goal Achievement: 04/10/15 Potential to Achieve Goals: Good Progress towards PT goals: Progressing toward goals    Frequency  Min 3X/week    PT Plan Current plan remains appropriate    Co-evaluation             End of Session   Activity Tolerance: Patient tolerated treatment well;No increased pain (self limiting) Patient left: in chair;with call bell/phone within reach;with family/visitor present     Time: 1642-1700 PT Time Calculation (min) (ACUTE ONLY): 18 min  Charges:  $Gait Training: 8-22 mins  G CodesDuncan Dull 01-May-2015, 5:09 PM Alben Deeds, [place] DPT  [phone]

## 2015-04-02 LAB — GLUCOSE, CAPILLARY
GLUCOSE-CAPILLARY: 127 mg/dL — AB (ref 70–99)
GLUCOSE-CAPILLARY: 128 mg/dL — AB (ref 70–99)
Glucose-Capillary: 108 mg/dL — ABNORMAL HIGH (ref 70–99)
Glucose-Capillary: 128 mg/dL — ABNORMAL HIGH (ref 70–99)
Glucose-Capillary: 132 mg/dL — ABNORMAL HIGH (ref 70–99)

## 2015-04-02 MED ORDER — SODIUM CHLORIDE 0.9 % IJ SOLN: 3.0000 mL | INTRAMUSCULAR | Status: DC | PRN

## 2015-04-02 MED ORDER — ONDANSETRON HCL 4 MG/2ML IJ SOLN
4.0000 mg | INTRAMUSCULAR | Status: DC | PRN
Start: 2015-04-02 — End: 2015-04-10

## 2015-04-02 MED ORDER — OXYCODONE-ACETAMINOPHEN 5-325 MG PO TABS: 1.0000 | ORAL_TABLET | ORAL | Status: DC | PRN

## 2015-04-02 NOTE — Progress Notes (Signed)
Physical Therapy Treatment Patient Details Name: Michele Palmer MRN: [id number] DOB: 02-09-1936 Today's Date: 04/02/2015    History of Present Illness Michele Palmer has presented today for surgery, with the diagnosis of diverticular perforation with abscess and fistula,  S/P COLOSTOMY TAKEDOWN, PARTIAL COLECTOMY WITH ANASTOMOSIS, BLADDER REPAIR, DIVERTING LOOP ILEOSTOMY    PT Comments    Pt progressing towards physical therapy goals. Reviewed HEP and encouraged pt to perform at least once more today to keep strength up. Tolerance for gait training was limited due to pain, however pt is functionally ambulating with no physical assistance. Will continue to follow and progress as able per POC.   Follow Up Recommendations  Home health PT;Supervision for mobility/OOB     Equipment Recommendations  None recommended by PT    Recommendations for Other Services       Precautions / Restrictions Precautions Precautions: Fall Restrictions Weight Bearing Restrictions: No    Mobility  Bed Mobility               General bed mobility comments: Pt sitting up in recliner upon PT arrival.   Transfers Overall transfer level: Needs assistance Equipment used: Standard walker Transfers: Sit to/from Stand Sit to Stand: Min guard         General transfer comment: Guarding for safety. Pt demonstrated proper hand placement and safety awareness.   Ambulation/Gait Ambulation/Gait assistance: Supervision Ambulation Distance (Feet): 200 Feet Assistive device: Rolling walker (2 wheeled) Gait Pattern/deviations: Step-through pattern;Decreased stride length;Trunk flexed Gait velocity: slow and guarded Gait velocity interpretation: Below normal speed for age/gender General Gait Details: VC's for improved posture and increased gait speed. Pt limited by pain.    Stairs            Wheelchair Mobility    Modified Rankin (Stroke Patients Only)       Balance Overall balance assessment:  Needs assistance Sitting-balance support: Feet supported;No upper extremity supported Sitting balance-Leahy Scale: Fair     Standing balance support: No upper extremity supported Standing balance-Leahy Scale: Fair Standing balance comment: Pt stood edge of chair without UE support, however for dynamic balance will require assist or UE support.                     Cognition Arousal/Alertness: Awake/alert Behavior During Therapy: WFL for tasks assessed/performed Overall Cognitive Status: Within Functional Limits for tasks assessed                      Exercises General Exercises - Lower Extremity Quad Sets: 15 reps Gluteal Sets: 15 reps Long Arc Quad: 15 reps Hip ABduction/ADduction: 15 reps    General Comments        Pertinent Vitals/Pain Pain Assessment: 0-10 Pain Score: 8  Pain Location: At incision site during ambulation Pain Descriptors / Indicators: Operative site guarding Pain Intervention(s): Limited activity within patient's tolerance;Monitored during session;Repositioned    Home Living                      Prior Function            PT Goals (current goals can now be found in the care plan section) Acute Rehab PT Goals Patient Stated Goal: mobilizing more easily PT Goal Formulation: With patient Time For Goal Achievement: 04/10/15 Potential to Achieve Goals: Good Progress towards PT goals: Progressing toward goals    Frequency  Min 3X/week    PT Plan Current plan remains appropriate  Co-evaluation             End of Session Equipment Utilized During Treatment: Gait belt Activity Tolerance: Patient limited by pain Patient left: in chair;with call bell/phone within reach;with family/visitor present     Time: 1138-1201 PT Time Calculation (min) (ACUTE ONLY): 23 min  Charges:  $Gait Training: 8-22 mins $Therapeutic Exercise: 8-22 mins                    G Codes:      Rolinda Roan 04-22-2015, 12:13 PM   Rolinda Roan, PT, DPT Acute Rehabilitation Services Pager: [phone]

## 2015-04-02 NOTE — Progress Notes (Signed)
GS Progress Note Subjective: Patient not nauseated for me.  No acute distress.  Eating soft diet.  Objective: Vital signs in last 24 hours: Temp:  [98.5 F (36.9 C)-99.3 F (37.4 C)] 98.5 F (36.9 C) (05/06 0516) Pulse Rate:  [89-106] 89 (05/06 0516) Resp:  [16-18] 18 (05/06 0516) BP: (121-165)/(59-74) 141/74 mmHg (05/06 0516) SpO2:  [92 %-94 %] 92 % (05/06 0516) Last BM Date: 04/01/15  Intake/Output from previous day: 05/05 0701 - 05/06 0700 In: 1965 [P.O.:240; I.V.:1725] Out: 1500 [Urine:900; Drains:100; Stool:500] Intake/Output this shift:    Lungs: Clear to auscultation  Abd: Soft, good bowel sounds.  Small amount of ? Stool like drainage at the bottom of her wound dressing.  Could not express naymore drainage.  Mild erythema at the bottom  Extremities: No clinical signs or symptoms of DVT.  Neuro: Intact  Lab Results: CBC   Recent Labs  03/31/15 0455  WBC 15.5*  HGB 8.8*  HCT 27.8*  PLT 237   BMET  Recent Labs  03/31/15 0455  NA 137  K 3.5  CL 103  CO2 24  GLUCOSE 111*  BUN 9  CREATININE 0.45  CALCIUM 8.6*   PT/INR No results for input(s): LABPROT, INR in the last 72 hours. ABG No results for input(s): PHART, HCO3 in the last 72 hours.  Invalid input(s): PCO2, PO2  Studies/Results: No results found.  Anti-infectives: Anti-infectives    Start     Dose/Rate Route Frequency Ordered Stop   03/26/15 2200  cefoTEtan (CEFOTAN) 1 g in dextrose 5 % 50 mL IVPB     1 g 100 mL/hr over 30 Minutes Intravenous Every 12 hours 03/26/15 1612 03/27/15 1255   03/26/15 0921  neomycin (MYCIFRADIN) tablet 1,000 mg  Status:  Discontinued     1,000 mg Oral 3 times per day 03/26/15 0921 03/26/15 0927   03/26/15 0921  erythromycin (E-MYCIN) tablet 1,000 mg  Status:  Discontinued     1,000 mg Oral 3 times per day 03/26/15 6808 03/26/15 0927   03/26/15 0921  cefoTEtan (CEFOTAN) 2 g in dextrose 5 % 50 mL IVPB     2 g 100 mL/hr over 30 Minutes Intravenous On call  to O.R. 03/26/15 0921 03/26/15 1133      Assessment/Plan: s/p Procedure(s): COLOSTOMY TAKEDOWN, PARTIAL COLECTOMY, BLADDER REPAIR, DIVERTING LOOP ILEOSTOMY CYSTOSCOPY WITH STENT PLACEMENT Plan for discharge tomorrow Remove Blake drain today.  Plan to see me in a couple of weeks. Probably reverse loop ileostomy in 2-3 months.  LOS: 7 days    [name]. [name], MD, FACS [phone] [facility] Surgery 04/02/2015

## 2015-04-02 NOTE — Discharge Summary (Signed)
Physician Discharge Summary  Patient ID: Michele Palmer MRN: [id number] DOB/AGE: 30-Dec-1935 79 y.o.  Admit date: 03/26/2015 Discharge date: 04/03/2015  Admission Diagnoses: 1. 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  She did well postoperatively although she initially had an elevated WBC, this came down by POD #3.  Ileostomy is functioning well, but not high output.  Blake drain was removed the day prior to discharge as it was draining minimal amounts.  Eating a soft diet to tolerance. On POD 8, the patient was stable for dc home.  Consults: urology  Significant Diagnostic Studies: labs: CBC/Bmet  Treatments: IV hydration and antibiotics: cefoxitin  Discharge Exam: Blood pressure 120/82, pulse 93, temperature 98.4 F (36.9 C), temperature source Oral, resp. rate 17, height 5\' 3"  (1.6 m), weight 83.2 kg (183 lb 6.8 oz), SpO2 95 %. General appearance: alert, cooperative, appears stated age and no distress Resp: clear to auscultation bilaterally GI: soft, non-tender; bowel sounds normal; no masses,  no organomegaly Incision/Wound:Left sided colostomy site is granulating well without infection.  Midline wound is healing well.  Ileostomy on the right is functioning well and not leaking.  Disposition: 06-Home-Health Care Svc     Medication List    ASK your doctor about these medications        acetaminophen 500 MG tablet  Commonly known as:  TYLENOL  Take 500-1,000 mg by mouth every 6 (six) hours as needed (pain).     ALPRAZolam 0.25 MG tablet  Commonly known as:  XANAX  Take 0.125 mg by mouth at  bedtime as needed for sleep.     amoxicillin-clavulanate 875-125 MG per tablet  Commonly known as:  AUGMENTIN  Take 1 tablet by mouth 2 (two) times daily.     anastrozole 1 MG tablet  Commonly known as:  ARIMIDEX  Take 1 tablet (1 mg total) by mouth daily.     citalopram 20 MG tablet  Commonly known as:  CELEXA  Take 10 mg by mouth at bedtime.     feeding supplement (PRO-STAT SUGAR FREE 64) Liqd  Take 30 mLs by mouth 2 (two) times daily.     feeding supplement (RESOURCE BREEZE) Liqd  Take 1 Container by mouth 2 (two) times daily between meals.     losartan 100 MG tablet  Commonly known as:  COZAAR  Take 100 mg by mouth daily.     metroNIDAZOLE 500 MG tablet  Commonly known as:  FLAGYL  Take 1,000 mg by mouth See admin instructions. On March 25, 2015 take 2 tablets (1000 mg) at 2pm, 3pm and 10pm (day before procedure)     neomycin 500 MG tablet  Commonly known as:  MYCIFRADIN  Take 1,000 mg by mouth See admin instructions. On 03/25/15 take 2 tablets (1000 mg) at 2pm, 3pm and 10pm     nitrofurantoin (macrocrystal-monohydrate) 100 MG capsule  Commonly known as:  MACROBID  Take 100 mg by mouth at bedtime. 14 day course starting 03/19/15     oxyCODONE-acetaminophen 5-325 MG per tablet  Commonly known as:  PERCOCET/ROXICET  Take 1-2 tablets by mouth every 4 (four) hours as needed for moderate pain.     polyethylene glycol packet  Commonly known as:  MIRALAX / GLYCOLAX  Take 17 g by mouth daily.     ranitidine 150 MG tablet  Commonly known as:  ZANTAC  Take 150 mg by mouth 2 (two) times daily as needed.     saccharomyces boulardii 250 MG capsule  Commonly known as:  FLORASTOR  Take 1 capsule (250 mg total) by mouth 2 (two) times daily.           Follow-up Information    Follow up with Claybon Jabs, MD On 04/09/2015.   Specialty:  Urology   Why:  at 8:15 AM   Contact information:   [place] [address] [phone]       Follow up with Judeth Horn, MD. Schedule an appointment as soon as possible for a visit in 3 weeks.   Specialty:  General Surgery   Why:  For wound re-check and overal evaluatiion   Contact information:   [address] [place]  [address] [phone]       Follow up with [place]. Schedule an appointment as soon as possible for a visit in 1 week.   Why:  For staple removal, Make appointment with nurse only   Contact information:   [place] [address] [phone]      Signed: Saverio Danker, PA-C 9:17 AM 04/03/2015

## 2015-04-02 NOTE — Consult Note (Signed)
WOC ostomy follow up Stoma type/location: RLQ, end ileostomy Stomal assessment/size: oval 1 3/8" x 1 3/4", slightly budded  Peristomal assessment: pouch intact from teaching visit with family yesterday Treatment options for stomal/peristomal skin: 2" barrier ring  Output liquid green and flatus Ostomy pouching: 1pc. In place, pattern from our visit for next pouch change and cutting new 1pc pouches in the room with all of her supplies Education provided: her daughter in law is not here this am, but I mentioned to the patient that since her family was independent with her ostomy care I think she is ok for DC without further education.  I have left educational booklet in the room for ileostomy.  Enrolled patient in [facility] Start Discharge program: Yes Supplies in the room for pouch change if needed.  Would benefit from [facility] to support transition to new ostomy care for ileostomy.  [place] team will follow along with you for continued support with ostomy care. Magic Mohler Liane Comber RN,CWOCN [phone]

## 2015-04-02 NOTE — Discharge Instructions (Signed)
Ileostomy Surgery, Care After Refer to this sheet in the next few weeks. These instructions provide you with information on caring for yourself after your procedure. Your caregiver may also give you more specific instructions. Your treatment has been planned according to current medical practices, but problems sometimes occur. Call your caregiver if you have any problems or questions after your procedure. HOME CARE INSTRUCTIONS  Rest. Give yourself time to adjust to having the external pouch if you have one. Give your surgical cuts (incisions) and new pouch time to heal.  Avoid strenuous activity, heavy lifting, and abdominal exercises for up to 10 weeks. After that, check with your caregiver.  Take pain medicine or other medicines as directed. Your caregiver may recommend certain medicines for the relief of constipation or diarrhea.  You may shower with or without the pouch.  Wear any clothing that is comfortable.  Follow your caregiver's instructions to protect the skin around the stoma.  Change your pouch as often as needed. It may need to be changed more often right after surgery.  Follow your caregiver's dietary instructions. Your caregiver will help you understand which foods may cause blockage, increase bowel movements, slow bowel movements, cause skin irritation, or cause gas.  You may gradually resume most activities.  Medicines may not be absorbed normally after your procedure.  Always discuss your medicines with your caregiver before taking them.  You have a loop ileostomy, so you may have some residual stool or mucous come from your rectum  You may travel. Be sure to pack plenty of ileostomy supplies.  If you smoke, quit. Smoking slows the healing process. SEEK MEDICAL CARE IF:  You are having trouble caring for your stoma or using the ostomy supplies (changing the pouch).  You feel nauseous.  You vomit.  You notice bleeding, skin irritation, drainage, bulging,  redness of the wounds, or pain around the anus or stoma.  You notice a change in the size or appearance of the stoma.  You have abdominal pain, bloating, pressure, or cramping.  Your stools do not become firmer.  Your stool frequency is more or less often than expected.  You experience sexual dysfunction.  You experience shortness of breath, fatigue, thirst, dry mouth, or unusual sensations in the limbs.  You have other new symptoms.  You have questions or concerns. SEEK IMMEDIATE MEDICAL CARE IF:  Your abdominal pain does not go away or it becomes severe.  You feel dizzy, lightheaded, or faint.  You measure pouch drainage of more than 1,500 ml per day. This amount of drainage can lead to dehydration.  You have a fever.  You keep vomiting.  Your stool is not draining through the stoma.  You have an irregular heartbeat or chest pain. MAKE SURE YOU:  Understand these instructions.  Will watch your condition.  Will get help right away if you are not doing well or get worse. Document Released: 06/26/2011 Document Revised: 11/18/2013 Document Reviewed: 06/26/2011 [facility] Patient Information 2015 [place], [place]. This information is not intended to replace advice given to you by your health care provider. Make sure you discuss any questions you have with your health care provider.

## 2015-04-02 NOTE — Progress Notes (Signed)
Patient ID: Michele Palmer, female   DOB: 1936/01/27, 79 y.o.   MRN: [id number] She reports intermittent mild nausea without vomiting. She also reported having passed a clot through her catheter. No abdominal pain.  AVSS Drain output minimal Catheter draining completely clear urine with no clots evident.  Doing well now with clear urine and low drain output.  1. DC pelvic drain. 2. Ready for discharge home from a urologic standpoint.

## 2015-04-02 NOTE — Care Management Note (Signed)
Case Management Note  Patient Details  Name: MERTHA CLYATT MRN: [id number] Date of Birth: [birth year]  Subjective/Objective:                    Action/Plan:   Expected Discharge Date:  04/03/15               Expected Discharge Plan:  [place]  In-House Referral:     Discharge planning Services  CM Consult  Post Acute Care Choice:    Choice offered to:  Patient (advance home care services for [facility] needs @ discharge per daughter Shelton Silvas [phone])  DME Arranged:    DME Agency:     HH Arranged:  RN, PT [place] Agency:  [place]  Status of Service:  Completed, signed off  Medicare Important Message Given:  Yes Date Medicare IM Given:  03/29/15 Medicare IM give by:  Whitman Hero RN Date Additional Medicare IM Given:  04/02/15 Additional Medicare Important Message give by:  Magdalen Spatz RN BSN   If discussed at [place] of Stay Meetings, dates discussed:    Additional Comments:  [name], RN 04/02/2015, 10:39 AM

## 2015-04-03 LAB — GLUCOSE, CAPILLARY
Glucose-Capillary: 124 mg/dL — ABNORMAL HIGH (ref 70–99)
Glucose-Capillary: 115 mg/dL — ABNORMAL HIGH (ref 70–99)

## 2015-04-03 NOTE — Progress Notes (Signed)
Discharge instructions reviewed with patient and patient's daughter. Questions answered. Rxs given and reviewed. Care of the surgical site, dressing change, and when to call the doctor reviewed. Pt ready for discharge.

## 2015-04-06 ENCOUNTER — Encounter (HOSPITAL_COMMUNITY): Payer: Self-pay | Admitting: *Deleted

## 2015-04-06 ENCOUNTER — Inpatient Hospital Stay (HOSPITAL_COMMUNITY)
Admission: EM | Admit: 2015-04-06 | Discharge: 2015-04-10 | DRG: 862 | Disposition: A | Discharge status: Home Health | Payer: Medicare Other | Attending: General Surgery | Admitting: General Surgery

## 2015-04-06 DIAGNOSIS — Z885 Allergy status to narcotic agent status: Secondary | ICD-10-CM

## 2015-04-06 DIAGNOSIS — Y838 Other surgical procedures as the cause of abnormal reaction of the patient, or of later complication, without mention of misadventure at the time of the procedure: Secondary | ICD-10-CM | POA: Diagnosis present

## 2015-04-06 DIAGNOSIS — Z853 Personal history of malignant neoplasm of breast: Secondary | ICD-10-CM

## 2015-04-06 DIAGNOSIS — K219 Gastro-esophageal reflux disease without esophagitis: Secondary | ICD-10-CM | POA: Diagnosis present

## 2015-04-06 DIAGNOSIS — K5792 Diverticulitis of intestine, part unspecified, without perforation or abscess without bleeding: Secondary | ICD-10-CM | POA: Diagnosis present

## 2015-04-06 DIAGNOSIS — F419 Anxiety disorder, unspecified: Secondary | ICD-10-CM | POA: Diagnosis present

## 2015-04-06 DIAGNOSIS — Z932 Ileostomy status: Secondary | ICD-10-CM

## 2015-04-06 DIAGNOSIS — Z881 Allergy status to other antibiotic agents status: Secondary | ICD-10-CM

## 2015-04-06 DIAGNOSIS — I10 Essential (primary) hypertension: Secondary | ICD-10-CM | POA: Diagnosis present

## 2015-04-06 DIAGNOSIS — E119 Type 2 diabetes mellitus without complications: Secondary | ICD-10-CM | POA: Diagnosis present

## 2015-04-06 DIAGNOSIS — Z888 Allergy status to other drugs, medicaments and biological substances status: Secondary | ICD-10-CM

## 2015-04-06 DIAGNOSIS — K651 Peritoneal abscess: Secondary | ICD-10-CM | POA: Diagnosis not present

## 2015-04-06 DIAGNOSIS — IMO0002 Reserved for concepts with insufficient information to code with codable children: Secondary | ICD-10-CM

## 2015-04-06 DIAGNOSIS — Z886 Allergy status to analgesic agent status: Secondary | ICD-10-CM

## 2015-04-06 DIAGNOSIS — T814XXA Infection following a procedure, initial encounter: Secondary | ICD-10-CM | POA: Diagnosis not present

## 2015-04-06 DIAGNOSIS — F329 Major depressive disorder, single episode, unspecified: Secondary | ICD-10-CM | POA: Diagnosis present

## 2015-04-06 HISTORY — DX: Cutaneous abscess of left axilla: L02.412

## 2015-04-06 LAB — CBC WITH DIFFERENTIAL/PLATELET
Basophils Absolute: 0 10*3/uL (ref 0.0–0.1)
Basophils Relative: 0 % (ref 0–1)
EOS ABS: 0.1 10*3/uL (ref 0.0–0.7)
EOS PCT: 0 % (ref 0–5)
HCT: 27.9 % — ABNORMAL LOW (ref 36.0–46.0)
HEMOGLOBIN: 9 g/dL — AB (ref 12.0–15.0)
LYMPHS ABS: 1.9 10*3/uL (ref 0.7–4.0)
Lymphocytes Relative: 9 % — ABNORMAL LOW (ref 12–46)
MCH: 29.6 pg (ref 26.0–34.0)
MCHC: 32.3 g/dL (ref 30.0–36.0)
MCV: 91.8 fL (ref 78.0–100.0)
MONOS PCT: 7 % (ref 3–12)
Monocytes Absolute: 1.4 10*3/uL — ABNORMAL HIGH (ref 0.1–1.0)
Neutro Abs: 17.8 10*3/uL — ABNORMAL HIGH (ref 1.7–7.7)
Neutrophils Relative %: 84 % — ABNORMAL HIGH (ref 43–77)
Platelets: 487 10*3/uL — ABNORMAL HIGH (ref 150–400)
RBC: 3.04 MIL/uL — ABNORMAL LOW (ref 3.87–5.11)
RDW: 14.1 % (ref 11.5–15.5)
WBC: 21.1 10*3/uL — ABNORMAL HIGH (ref 4.0–10.5)

## 2015-04-06 LAB — TYPE AND SCREEN
ABO/RH(D): A POS
Antibody Screen: NEGATIVE
UNIT DIVISION: 0
Unit division: 0

## 2015-04-06 LAB — COMPREHENSIVE METABOLIC PANEL
ALBUMIN: 1.8 g/dL — AB (ref 3.5–5.0)
ALT: 14 U/L (ref 14–54)
ANION GAP: 10 (ref 5–15)
AST: 19 U/L (ref 15–41)
Alkaline Phosphatase: 175 U/L — ABNORMAL HIGH (ref 38–126)
BUN: 8 mg/dL (ref 6–20)
CO2: 28 mmol/L (ref 22–32)
Calcium: 8.1 mg/dL — ABNORMAL LOW (ref 8.9–10.3)
Chloride: 95 mmol/L — ABNORMAL LOW (ref 101–111)
Creatinine, Ser: 0.55 mg/dL (ref 0.44–1.00)
GFR calc Af Amer: >60 mL/min (ref >60)
GFR calc non Af Amer: >60 mL/min (ref >60)
Glucose, Bld: 149 mg/dL — ABNORMAL HIGH (ref 70–99)
POTASSIUM: 3.8 mmol/L (ref 3.5–5.1)
Sodium: 133 mmol/L — ABNORMAL LOW (ref 135–145)
TOTAL PROTEIN: 6.5 g/dL (ref 6.5–8.1)
Total Bilirubin: 0.7 mg/dL (ref 0.3–1.2)

## 2015-04-06 LAB — I-STAT CG4 LACTIC ACID, ED: Lactic Acid, Venous: 1.14 mmol/L (ref 0.5–2.0)

## 2015-04-06 MED ORDER — SODIUM CHLORIDE 0.9 % IV BOLUS (SEPSIS)
1000.0000 mL | Freq: Once | INTRAVENOUS | Status: AC
  Administered 2015-04-06: 1000 mL via INTRAVENOUS

## 2015-04-06 NOTE — ED Notes (Signed)
Pt in with family stating that she had abdominal surgery last week with an open wound, increased drainage from wound today, fever developed this afternoon up to 100.9. Pt reports increased pain, took two percocet PTA and pain improved, alert and oriented, no distress noted

## 2015-04-06 NOTE — ED Notes (Signed)
MD at bedside. 

## 2015-04-06 NOTE — ED Provider Notes (Signed)
CSN: [id number]     Arrival date & time 04/06/15  1812 History   First MD Initiated Contact with Patient 04/06/15 2043     Chief Complaint  Patient presents with  . Post-op Problem  . Fever   Michele Palmer is a 79 y.o. female with a past medical history significant for hypertension, diabetes, diverticulitis status post colon to me and call ostomy, recently converted to an ileostomy following a colovesical fistula formation and subsequent resection one week prior to arrival who presents with abdominal pain and fever. The patient reports that she was discharged 3 days ago following an abdominal surgery converting her from an ostomy to an ileostomy. The patient reports that she has had normal output of her ostomy. The patient says that she noticed some yellow drainage from her ostomy site earlier today as well as an increase in abdominal pain. The patient reported having pain all across her abdomen in the upper portions. The patient denied chest pain, shortness of breath, nausea, vomiting, constipation, diarrhea, dysuria. The patient has an indwelling Foley following the procedure. The patient reported having shaking chills and a measured fever of 100.9 orally. The patient decided to come to the emergency department for further evaluation for her worsening abdominal pain and fever in the postoperative setting.   (Consider location/radiation/quality/duration/timing/severity/associated sxs/prior Treatment) Patient is a 79 y.o. female presenting with abdominal pain. The history is provided by the patient and a relative. No language interpreter was used.  Abdominal Pain Pain location:  LUQ, RUQ and epigastric Pain radiates to:  Does not radiate Pain severity:  Moderate Onset quality:  Gradual Duration:  1 day Timing:  Intermittent Progression:  Waxing and waning Chronicity:  New Context: previous surgery   Context: not sick contacts and not trauma   Relieved by:  Nothing Worsened by:  Nothing  tried Ineffective treatments:  None tried Associated symptoms: chills and fever   Associated symptoms: no anorexia, no chest pain, no constipation, no cough, no diarrhea, no dysuria, no fatigue, no nausea, no shortness of breath and no vomiting   Fever:    Duration:  1 day   Timing:  Intermittent   Max temp PTA (F):  100.9   Temp source:  Oral   Progression:  Waxing and waning Risk factors: multiple surgeries and recent hospitalization     Past Medical History  Diagnosis Date  . Anemia   . Arthritis   . Cancer   . GERD (gastroesophageal reflux disease)   . Anxiety   . Depression   . Breast cancer, right breast 07/04/2013    ILC, 1.8 cm, neg sln receptor+, surgery on 08/01/13   . Hypertension   . Diabetes mellitus without complication     pt stated she is no longer diabetic and is not required to take any glucose meds  . Colovesical fistula    Past Surgical History  Procedure Laterality Date  . Ovary surgery      removed ovaries  . Salpingoophorectomy    . Breast lumpectomy with needle localization and axillary sentinel lymph node bx Right 08/01/2013    Procedure: BREAST LUMPECTOMY WITH NEEDLE LOCALIZATION AND AXILLARY SENTINEL LYMPH NODE BIOPSY;  Surgeon: Haywood Lasso, MD;  Location: [place];  Service: General;  Laterality: Right;  . Colostomy N/A 01/21/2015    Procedure: DIVERTING LOOP COLOSTOMY;  Surgeon: Doreen Salvage, MD;  Location: [place];  Service: General;  Laterality: N/A;  . Appendectomy  1966  . Tubal ligation    .  Tonsillectomy    . Partial colectomy N/A 03/26/2015    Procedure: COLOSTOMY TAKEDOWN, PARTIAL COLECTOMY, BLADDER REPAIR, DIVERTING LOOP ILEOSTOMY;  Surgeon: Judeth Horn, MD;  Location: [place];  Service: General;  Laterality: N/A;  . Cystoscopy with stent placement N/A 03/26/2015    Procedure: CYSTOSCOPY WITH STENT PLACEMENT;  Surgeon: Kathie Rhodes, MD;  Location: [place];  Service: Urology;  Laterality: N/A;   Family History  Problem Relation  Age of Onset  . Stroke Mother   . Stroke Father   . Cancer Sister     ovarian   History  Substance Use Topics  . Smoking status: Never Smoker   . Smokeless tobacco: Never Used  . Alcohol Use: No   OB History    No data available     Review of Systems  Constitutional: Positive for fever and chills. Negative for activity change, appetite change and fatigue.  Respiratory: Negative for cough, choking, chest tightness, shortness of breath, wheezing and stridor.   Cardiovascular: Negative for chest pain and palpitations.  Gastrointestinal: Positive for abdominal pain. Negative for nausea, vomiting, diarrhea, constipation, abdominal distention and anorexia.  Genitourinary: Negative for dysuria.  Musculoskeletal: Negative for back pain and neck stiffness.  Skin: Negative for rash and wound.  Neurological: Negative for headaches.  All other systems reviewed and are negative.     Allergies  Aspirin; Ciprofloxacin; Codeine; and Sulfur  Home Medications   Prior to Admission medications   Medication Sig Start Date End Date Taking? Authorizing Provider  acetaminophen (TYLENOL) 500 MG tablet Take 500-1,000 mg by mouth every 6 (six) hours as needed (pain).    Historical Provider, MD  ALPRAZolam Duanne Moron) 0.25 MG tablet Take 0.125 mg by mouth at bedtime as needed for sleep.     Historical Provider, MD  Amino Acids-Protein Hydrolys (FEEDING SUPPLEMENT, PRO-STAT SUGAR FREE 64,) LIQD Take 30 mLs by mouth 2 (two) times daily. Patient not taking: Reported on 03/19/2015 02/05/15   Erby Pian, NP  anastrozole (ARIMIDEX) 1 MG tablet Take 1 tablet (1 mg total) by mouth daily. 02/09/14   Consuela Mimes, MD  citalopram (CELEXA) 20 MG tablet Take 10 mg by mouth at bedtime.  01/03/15   Historical Provider, MD  feeding supplement, RESOURCE BREEZE, (RESOURCE BREEZE) LIQD Take 1 Container by mouth 2 (two) times daily between meals. Patient taking differently: Take 1 Container by mouth daily after breakfast.   02/05/15   Erby Pian, NP  losartan (COZAAR) 100 MG tablet Take 100 mg by mouth daily.    Historical Provider, MD  ondansetron (ZOFRAN) 4 MG/2ML SOLN injection Inject 2 mLs (4 mg total) into the vein every 4 (four) hours as needed for nausea or vomiting. 04/02/15   Judeth Horn, MD  oxyCODONE-acetaminophen (PERCOCET/ROXICET) 5-325 MG per tablet Take 1-2 tablets by mouth every 4 (four) hours as needed for moderate pain. 04/02/15   Judeth Horn, MD  ranitidine (ZANTAC) 150 MG tablet Take 150 mg by mouth 2 (two) times daily as needed.  01/05/15   Historical Provider, MD   BP 117/63 mmHg  Pulse 96  Temp(Src) 99.5 F (37.5 C) (Oral)  Resp 18  SpO2 95% Physical Exam  Constitutional: She is oriented to person, place, and time. She appears well-developed and well-nourished. No distress.  HENT:  Head: Normocephalic.  Mouth/Throat: No oropharyngeal exudate.  Eyes: Pupils are equal, round, and reactive to light.  Neck: Normal range of motion.  Cardiovascular: Normal rate, regular rhythm, normal heart sounds and intact distal pulses.  No murmur heard. Pulmonary/Chest: Effort normal and breath sounds normal. No stridor. No respiratory distress. She has no wheezes. She has no rales. She exhibits no tenderness.  Abdominal: Soft. Bowel sounds are normal. She exhibits no distension. There is tenderness. There is no rebound and no guarding.    Musculoskeletal: She exhibits no tenderness.  Neurological: She is alert and oriented to person, place, and time. She exhibits normal muscle tone.  Skin: She is not diaphoretic. No pallor.  Nursing note and vitals reviewed.   ED Course  Procedures (including critical care time) Labs Review Labs Reviewed  CBC WITH DIFFERENTIAL/PLATELET - Abnormal; Notable for the following:    WBC 21.1 (*)    RBC 3.04 (*)    Hemoglobin 9.0 (*)    HCT 27.9 (*)    Platelets 487 (*)    Neutrophils Relative % 84 (*)    Neutro Abs 17.8 (*)    Lymphocytes Relative 9 (*)     Monocytes Absolute 1.4 (*)    All other components within normal limits  COMPREHENSIVE METABOLIC PANEL - Abnormal; Notable for the following:    Sodium 133 (*)    Chloride 95 (*)    Glucose, Bld 149 (*)    Calcium 8.1 (*)    Albumin 1.8 (*)    Alkaline Phosphatase 175 (*)    All other components within normal limits  CULTURE, BLOOD (ROUTINE X 2)  CULTURE, BLOOD (ROUTINE X 2)  I-STAT CG4 LACTIC ACID, ED    Imaging Review Ct Abdomen Pelvis W Contrast  04/07/2015   CLINICAL DATA:  Abdominal surgery with open wound last week. Increase drainage from the wound today. Fever developing this afternoon. Increased pain.  EXAM: CT ABDOMEN AND PELVIS WITH CONTRAST  TECHNIQUE: Multidetector CT imaging of the abdomen and pelvis was performed using the standard protocol following bolus administration of intravenous contrast.  CONTRAST:  187mL OMNIPAQUE IOHEXOL 300 MG/ML  SOLN  COMPARISON:  03/09/2015  FINDINGS: Small bilateral pleural effusions and basilar atelectasis, greater on the left. Coronary artery calcification.  Loculated left subdiaphragmatic fluid collection with thick enhancing wall and small gas bubbles. This is suspicious for other subdiaphragmatic abscess. The fluid collection measures about 6.8 x 9.9 cm maximal diameter. Spleen is displaced anteriorly but does not appear involved. There is skin defect in the left lower quadrant anterior subcutaneous fat with infiltration and skin clips along the midline of the lower abdominal and pelvic wall consistent with postoperative change. No subcutaneous fluid collection is demonstrated. There is apparent resection of the sigmoid colon with anastomosis at the rectosigmoid region and diverting right lower quadrant ileostomy. Contrast material flows through to the ileostomy without evidence of obstruction. No small or large bowel distention. Colon is partially stool-filled and mostly decompressed.  Mild diffuse fatty infiltration of the liver. Gallbladder,  pancreas, adrenal glands, inferior vena cava, and retroperitoneal lymph nodes are unremarkable. Calcification of abdominal aorta without aneurysm. Nephrograms are symmetrical. Mild hydronephrosis of the left kidney without obvious obstructing lesion or stone.  Pelvis: Uterus and ovaries are not enlarged. There is a Foley catheter in the bladder. Gas in the bladder is likely related to Foley catheter. No free or loculated pelvic fluid collections. Iliac chain lymph nodes are not pathologically enlarged and are probably reactive. Diffuse bone demineralization. Degenerative changes in the lumbar spine and hips. No destructive bone lesion.  IMPRESSION: Postoperative changes with sigmoid resection and anastomosis and diverting right lower quadrant ileostomy. Skin clips and incision wounds in anterior abdominal wall.  Left subdiaphragmatic abscess. Small bilateral pleural effusions and basilar atelectasis, greater on the left.   Electronically Signed   By: Lucienne Capers M.D.   On: 04/07/2015 01:22     EKG Interpretation None      MDM   Final diagnoses:  Abdominal abscess    Michele Palmer is a 79 y.o. female with a past medical history significant for hypertension, diabetes, diverticulitis status post colon to me and call ostomy, recently converted to an ileostomy following a colovesical fistula formation and subsequent resection one week prior to arrival who presents with abdominal pain and fever. On arrival, the patient reported that her abdominal pain had improved following home Percocets. The patient said her pain is mild on arrival. The patient denied nausea or vomiting or any other symptoms.  On exam, the patient had mild tenderness across her abdomen and near her stapled surgical incision.  In the setting of the recent operation, a CT with contrast was ordered of the abdomen and pelvis to evaluate for possible infection. The results of the CT scan are seen above which revealed a left  subdiaphragmatic abscess and small bilateral pleural effusions.  The Gen. surgery team was consult for management recommendations. The patient was started on IV Zosyn for antibiotic coverage of her intra-abdominal abscess. The plan of care will be to follow up on the Gen. surgery recommendations.      Antony Blackbird, MD 04/07/15 [id number]  Carmin Muskrat, MD 04/07/15 [phone]

## 2015-04-07 ENCOUNTER — Emergency Department (HOSPITAL_COMMUNITY): Payer: Medicare Other

## 2015-04-07 ENCOUNTER — Observation Stay (HOSPITAL_COMMUNITY): Payer: Medicare Other

## 2015-04-07 ENCOUNTER — Encounter (HOSPITAL_COMMUNITY): Payer: Self-pay | Admitting: Radiology

## 2015-04-07 DIAGNOSIS — K651 Peritoneal abscess: Secondary | ICD-10-CM | POA: Diagnosis present

## 2015-04-07 LAB — SURGICAL PCR SCREEN
MRSA, PCR: NEGATIVE
STAPHYLOCOCCUS AUREUS: NEGATIVE

## 2015-04-07 LAB — CBC WITH DIFFERENTIAL/PLATELET
BASOS ABS: 0 10*3/uL (ref 0.0–0.1)
Basophils Relative: 0 % (ref 0–1)
EOS PCT: 2 % (ref 0–5)
Eosinophils Absolute: 0.2 10*3/uL (ref 0.0–0.7)
HEMATOCRIT: 24.6 % — AB (ref 36.0–46.0)
HEMOGLOBIN: 7.9 g/dL — AB (ref 12.0–15.0)
LYMPHS PCT: 16 % (ref 12–46)
Lymphs Abs: 2.4 10*3/uL (ref 0.7–4.0)
MCH: 29.4 pg (ref 26.0–34.0)
MCHC: 32.1 g/dL (ref 30.0–36.0)
MCV: 91.4 fL (ref 78.0–100.0)
MONO ABS: 1.4 10*3/uL — AB (ref 0.1–1.0)
MONOS PCT: 10 % (ref 3–12)
NEUTROS ABS: 10.6 10*3/uL — AB (ref 1.7–7.7)
Neutrophils Relative %: 73 % (ref 43–77)
Platelets: 470 10*3/uL — ABNORMAL HIGH (ref 150–400)
RBC: 2.69 MIL/uL — ABNORMAL LOW (ref 3.87–5.11)
RDW: 14.3 % (ref 11.5–15.5)
WBC: 14.6 10*3/uL — AB (ref 4.0–10.5)

## 2015-04-07 LAB — BASIC METABOLIC PANEL
ANION GAP: 10 (ref 5–15)
BUN: 7 mg/dL (ref 6–20)
CALCIUM: 8.1 mg/dL — AB (ref 8.9–10.3)
CO2: 30 mmol/L (ref 22–32)
Chloride: 96 mmol/L — ABNORMAL LOW (ref 101–111)
Creatinine, Ser: 0.51 mg/dL (ref 0.44–1.00)
GFR calc non Af Amer: >60 mL/min (ref >60)
Glucose, Bld: 87 mg/dL (ref 70–99)
Potassium: 3.4 mmol/L — ABNORMAL LOW (ref 3.5–5.1)
Sodium: 136 mmol/L (ref 135–145)

## 2015-04-07 LAB — I-STAT CG4 LACTIC ACID, ED: Lactic Acid, Venous: 0.98 mmol/L (ref 0.5–2.0)

## 2015-04-07 MED ORDER — PIPERACILLIN-TAZOBACTAM 3.375 G IVPB 30 MIN
3.3750 g | Freq: Once | INTRAVENOUS | Status: AC
  Administered 2015-04-07: 3.375 g via INTRAVENOUS
  Filled 2015-04-07: qty 50

## 2015-04-07 MED ORDER — PIPERACILLIN-TAZOBACTAM 3.375 G IVPB
3.3750 g | Freq: Three times a day (TID) | INTRAVENOUS | Status: DC
  Administered 2015-04-07 – 2015-04-10 (×10): 3.375 g via INTRAVENOUS
  Filled 2015-04-07 (×11): qty 50

## 2015-04-07 MED ORDER — MIDAZOLAM HCL 2 MG/2ML IJ SOLN
INTRAMUSCULAR | Status: AC | PRN
  Administered 2015-04-07 (×2): 1 mg via INTRAVENOUS

## 2015-04-07 MED ORDER — HYDROCODONE-ACETAMINOPHEN 5-325 MG PO TABS
1.0000 | ORAL_TABLET | ORAL | Status: DC | PRN
  Administered 2015-04-08 – 2015-04-09 (×3): 2 via ORAL
  Filled 2015-04-07 (×3): qty 2

## 2015-04-07 MED ORDER — ONDANSETRON HCL 4 MG/2ML IJ SOLN
4.0000 mg | INTRAMUSCULAR | Status: DC | PRN
  Administered 2015-04-09 – 2015-04-10 (×2): 4 mg via INTRAVENOUS
  Filled 2015-04-07 (×2): qty 2

## 2015-04-07 MED ORDER — LIDOCAINE HCL 1 % IJ SOLN
INTRAMUSCULAR | Status: AC
  Filled 2015-04-07: qty 20

## 2015-04-07 MED ORDER — ACETAMINOPHEN 500 MG PO TABS: 500.0000 mg | ORAL_TABLET | Freq: Four times a day (QID) | ORAL | Status: DC | PRN

## 2015-04-07 MED ORDER — IOHEXOL 300 MG/ML  SOLN
100.0000 mL | Freq: Once | INTRAMUSCULAR | Status: AC | PRN
  Administered 2015-04-07: 100 mL via INTRAVENOUS

## 2015-04-07 MED ORDER — ALPRAZOLAM 0.25 MG PO TABS: 0.1250 mg | ORAL_TABLET | Freq: Every evening | ORAL | Status: DC | PRN

## 2015-04-07 MED ORDER — METRONIDAZOLE IN NACL 5-0.79 MG/ML-% IV SOLN
500.0000 mg | Freq: Three times a day (TID) | INTRAVENOUS | Status: DC
  Administered 2015-04-07: 500 mg via INTRAVENOUS
  Filled 2015-04-07 (×3): qty 100

## 2015-04-07 MED ORDER — HYDROMORPHONE HCL 1 MG/ML IJ SOLN
0.5000 mg | INTRAMUSCULAR | Status: DC | PRN
  Administered 2015-04-07 (×2): 1 mg via INTRAVENOUS
  Filled 2015-04-07 (×2): qty 1

## 2015-04-07 MED ORDER — MIDAZOLAM HCL 2 MG/2ML IJ SOLN
INTRAMUSCULAR | Status: AC
  Filled 2015-04-07: qty 4

## 2015-04-07 MED ORDER — ONDANSETRON HCL 4 MG/2ML IJ SOLN
4.0000 mg | Freq: Once | INTRAMUSCULAR | Status: AC
  Administered 2015-04-07: 4 mg via INTRAVENOUS
  Filled 2015-04-07: qty 2

## 2015-04-07 MED ORDER — SODIUM CHLORIDE 0.9 % IV SOLN
Freq: Once | INTRAVENOUS | Status: AC
  Administered 2015-04-07: 08:00:00 via INTRAVENOUS

## 2015-04-07 MED ORDER — FENTANYL CITRATE (PF) 100 MCG/2ML IJ SOLN
INTRAMUSCULAR | Status: AC
  Filled 2015-04-07: qty 4

## 2015-04-07 MED ORDER — FERROUS SULFATE 325 (65 FE) MG PO TABS
325.0000 mg | ORAL_TABLET | Freq: Two times a day (BID) | ORAL | Status: DC
  Administered 2015-04-07 – 2015-04-10 (×6): 325 mg via ORAL
  Filled 2015-04-07 (×8): qty 1

## 2015-04-07 MED ORDER — FENTANYL CITRATE (PF) 100 MCG/2ML IJ SOLN
INTRAMUSCULAR | Status: AC | PRN
  Administered 2015-04-07 (×2): 50 ug via INTRAVENOUS

## 2015-04-07 MED ORDER — LOSARTAN POTASSIUM 50 MG PO TABS
100.0000 mg | ORAL_TABLET | Freq: Every day | ORAL | Status: DC
  Administered 2015-04-07 – 2015-04-10 (×4): 100 mg via ORAL
  Filled 2015-04-07 (×4): qty 2

## 2015-04-07 MED ORDER — MORPHINE SULFATE 4 MG/ML IJ SOLN
6.0000 mg | Freq: Once | INTRAMUSCULAR | Status: AC
Start: 2015-04-07 — End: 2015-04-07
  Administered 2015-04-07: 6 mg via INTRAVENOUS
  Filled 2015-04-07: qty 2

## 2015-04-07 MED ORDER — CITALOPRAM HYDROBROMIDE 20 MG PO TABS
10.0000 mg | ORAL_TABLET | Freq: Every day | ORAL | Status: DC
  Administered 2015-04-07 – 2015-04-09 (×3): 10 mg via ORAL
  Filled 2015-04-07 (×4): qty 1

## 2015-04-07 MED ORDER — ENOXAPARIN SODIUM 40 MG/0.4ML ~~LOC~~ SOLN
40.0000 mg | SUBCUTANEOUS | Status: DC
  Administered 2015-04-07 – 2015-04-10 (×4): 40 mg via SUBCUTANEOUS
  Filled 2015-04-07 (×5): qty 0.4

## 2015-04-07 MED ORDER — PRO-STAT SUGAR FREE PO LIQD
30.0000 mL | Freq: Two times a day (BID) | ORAL | Status: DC
  Administered 2015-04-07 – 2015-04-08 (×2): 30 mL via ORAL
  Filled 2015-04-07 (×4): qty 30

## 2015-04-07 MED ORDER — OXYCODONE-ACETAMINOPHEN 5-325 MG PO TABS
1.0000 | ORAL_TABLET | ORAL | Status: DC | PRN
  Administered 2015-04-08: 1 via ORAL
  Administered 2015-04-08 – 2015-04-10 (×3): 2 via ORAL
  Filled 2015-04-07 (×4): qty 2
  Filled 2015-04-07: qty 1

## 2015-04-07 MED ORDER — BOOST / RESOURCE BREEZE PO LIQD
1.0000 | Freq: Two times a day (BID) | ORAL | Status: DC
  Administered 2015-04-08: 1 via ORAL
  Filled 2015-04-07: qty 1

## 2015-04-07 MED ORDER — ANASTROZOLE 1 MG PO TABS
1.0000 mg | ORAL_TABLET | Freq: Every day | ORAL | Status: DC
  Administered 2015-04-07 – 2015-04-10 (×4): 1 mg via ORAL
  Filled 2015-04-07 (×4): qty 1

## 2015-04-07 MED ORDER — KCL IN DEXTROSE-NACL 20-5-0.45 MEQ/L-%-% IV SOLN
INTRAVENOUS | Status: DC
  Administered 2015-04-07 – 2015-04-09 (×3): via INTRAVENOUS
  Filled 2015-04-07 (×6): qty 1000

## 2015-04-07 NOTE — Progress Notes (Signed)
Catheter drainage is serous.  Does not appear purulent.  Gran stain pending.  [name]. [name], MD, [place] [phone] [facility]

## 2015-04-07 NOTE — ED Notes (Signed)
Dr. Ralene Bathe at the bedside to check on pt.

## 2015-04-07 NOTE — ED Notes (Signed)
Patient intermittently desaturates to 88% when she sleeps, states it hurts to take deep breaths.  Placed on 3 liters o2 [place]

## 2015-04-07 NOTE — ED Notes (Signed)
MD at bedside.  Phlebotomy notified of blood cultures, at bedside to draw.  Patient updated on plan of care and pending consult to general surgery, patient requesting something to eat.  Notified pt to wait until surgery can see pt.

## 2015-04-07 NOTE — Progress Notes (Signed)
Patient back from IR s/p CT IMAGE GUIDED DRAINAGE BY PERCUTANEOUS CATHETER, BP 158/79 otherwise VSS , afebrile, A+Ox4, pain controlled on IV pain medications. Will continue to monitor.

## 2015-04-07 NOTE — Progress Notes (Signed)
Patient transferred to 6N with NA Danielle.

## 2015-04-07 NOTE — Progress Notes (Signed)
NURSING PROGRESS NOTE  Michele Palmer [id number] Admission Data: 04/07/2015 12:51 PM Attending Provider: Judeth Horn, MD DTO:IZTI,WPYKD B., MD Code Status: Full  Allergies:  Aspirin; Ciprofloxacin; Codeine; and Sulfur Past Medical History:   has a past medical history of Anemia; Arthritis; Cancer; GERD (gastroesophageal reflux disease); Anxiety; Depression; Breast cancer, right breast (07/04/2013); Hypertension; Diabetes mellitus without complication; and Colovesical fistula. Past Surgical History:   has past surgical history that includes Ovary surgery; Salpingoophorectomy; Breast lumpectomy with needle localization and axillary sentinel lymph node bx (Right, 08/01/2013); Colostomy (N/A, 01/21/2015); Appendectomy (1966); Tubal ligation; Tonsillectomy; Partial colectomy (N/A, 03/26/2015); and Cystoscopy with stent placement (N/A, 03/26/2015). Social History:   reports that she has never smoked. She has never used smokeless tobacco. She reports that she does not drink alcohol or use illicit drugs.  Michele Palmer is a 79 y.o. female patient admitted from ED:   Last Documented Vital Signs: Blood pressure 179/81, pulse 84, temperature 98.3 F (36.8 C), temperature source Oral, resp. rate 16, SpO2 94 %.   IV Fluids:  IV in place, occlusive dsg intact without redness, IV cath forearm right, condition patent and no redness normal saline.   Skin: Midline w/ staples, Ileostomy  Patient/Family orientated to room. Information packet given to patient/family. Admission inpatient armband information verified with patient/family to include name and date of birth and placed on patient arm. Side rails up x 2, fall assessment and education completed with patient/family. Patient/family able to verbalize understanding of risk associated with falls and verbalized understanding to call for assistance before getting out of bed. Call light within reach. Patient/family able to voice and demonstrate understanding of unit  orientation instructions.    Will continue to evaluate and treat per MD orders.   Hendricks Limes RN, BS, BSN

## 2015-04-07 NOTE — ED Notes (Signed)
Attempted report 

## 2015-04-07 NOTE — Progress Notes (Signed)
79yo female w/ colostomy recently converted to ileostomy following a fistula formation, c/o abdominal pain and fever, to begin IV ABX while awaiting surgical consult.  Will start Zosyn 3.375g IV Q8H for CrCl ~60 ml/min and monitor CBC and Cx.  Wynona Neat, PharmD, BCPS 04/07/2015 7:13 AM

## 2015-04-07 NOTE — Sedation Documentation (Signed)
Pt c/o pain from lying on side and to arm raised over head.

## 2015-04-07 NOTE — H&P (Signed)
Michele Palmer is an 79 y.o. female.   Chief Complaint: Back p ain and fevers HPI: Patient recently discharged, brought back after she had fevers at home up to 100.7, had chills, no nausea or vomiting.  Ileostomy is functioning well.  Eating well.  CT scan done in the ED demonstrates a left large subdiaphragmatic abscess.  IR has been called for percutaneous drainage and catheter placement.  Past Medical History  Diagnosis Date  . Anemia   . Arthritis   . Cancer   . GERD (gastroesophageal reflux disease)   . Anxiety   . Depression   . Breast cancer, right breast 07/04/2013    ILC, 1.8 cm, neg sln receptor+, surgery on 08/01/13   . Hypertension   . Diabetes mellitus without complication     pt stated she is no longer diabetic and is not required to take any glucose meds  . Colovesical fistula     Past Surgical History  Procedure Laterality Date  . Ovary surgery      removed ovaries  . Salpingoophorectomy    . Breast lumpectomy with needle localization and axillary sentinel lymph node bx Right 08/01/2013    Procedure: BREAST LUMPECTOMY WITH NEEDLE LOCALIZATION AND AXILLARY SENTINEL LYMPH NODE BIOPSY;  Surgeon: Haywood Lasso, MD;  Location: [place];  Service: General;  Laterality: Right;  . Colostomy N/A 01/21/2015    Procedure: DIVERTING LOOP COLOSTOMY;  Surgeon: Doreen Salvage, MD;  Location: [place];  Service: General;  Laterality: N/A;  . Appendectomy  1966  . Tubal ligation    . Tonsillectomy    . Partial colectomy N/A 03/26/2015    Procedure: COLOSTOMY TAKEDOWN, PARTIAL COLECTOMY, BLADDER REPAIR, DIVERTING LOOP ILEOSTOMY;  Surgeon: Judeth Horn, MD;  Location: [place];  Service: General;  Laterality: N/A;  . Cystoscopy with stent placement N/A 03/26/2015    Procedure: CYSTOSCOPY WITH STENT PLACEMENT;  Surgeon: Kathie Rhodes, MD;  Location: [place];  Service: Urology;  Laterality: N/A;    Family History  Problem Relation Age of Onset  . Stroke Mother   . Stroke Father   .  Cancer Sister     ovarian   Social History:  reports that she has never smoked. She has never used smokeless tobacco. She reports that she does not drink alcohol or use illicit drugs.  Allergies:  Allergies  Allergen Reactions  . Aspirin Other (See Comments)    Makes me "shaky"  . Ciprofloxacin Hives  . Codeine Other (See Comments)    Headache  . Sulfur Hives and Rash    Welps      (Not in a hospital admission)  Results for orders placed or performed during the hospital encounter of 04/06/15 (from the past 48 hour(s))  CBC WITH DIFFERENTIAL     Status: Abnormal   Collection Time: 04/06/15  6:49 PM  Result Value Ref Range   WBC 21.1 (H) 4.0 - 10.5 K/uL   RBC 3.04 (L) 3.87 - 5.11 MIL/uL   Hemoglobin 9.0 (L) 12.0 - 15.0 g/dL   HCT 27.9 (L) 36.0 - 46.0 %   MCV 91.8 78.0 - 100.0 fL   MCH 29.6 26.0 - 34.0 pg   MCHC 32.3 30.0 - 36.0 g/dL   RDW 14.1 11.5 - 15.5 %   Platelets 487 (H) 150 - 400 K/uL   Neutrophils Relative % 84 (H) 43 - 77 %   Neutro Abs 17.8 (H) 1.7 - 7.7 K/uL   Lymphocytes Relative 9 (L) 12 -  46 %   Lymphs Abs 1.9 0.7 - 4.0 K/uL   Monocytes Relative 7 3 - 12 %   Monocytes Absolute 1.4 (H) 0.1 - 1.0 K/uL   Eosinophils Relative 0 0 - 5 %   Eosinophils Absolute 0.1 0.0 - 0.7 K/uL   Basophils Relative 0 0 - 1 %   Basophils Absolute 0.0 0.0 - 0.1 K/uL  Comprehensive metabolic panel     Status: Abnormal   Collection Time: 04/06/15  6:49 PM  Result Value Ref Range   Sodium 133 (L) 135 - 145 mmol/L   Potassium 3.8 3.5 - 5.1 mmol/L   Chloride 95 (L) 101 - 111 mmol/L   CO2 28 22 - 32 mmol/L   Glucose, Bld 149 (H) 70 - 99 mg/dL   BUN 8 6 - 20 mg/dL   Creatinine, Ser 0.55 0.44 - 1.00 mg/dL   Calcium 8.1 (L) 8.9 - 10.3 mg/dL   Total Protein 6.5 6.5 - 8.1 g/dL   Albumin 1.8 (L) 3.5 - 5.0 g/dL   AST 19 15 - 41 U/L   ALT 14 14 - 54 U/L   Alkaline Phosphatase 175 (H) 38 - 126 U/L   Total Bilirubin 0.7 0.3 - 1.2 mg/dL   GFR calc non Af Amer >60 >60 mL/min   GFR calc  Af Amer >60 >60 mL/min    Comment: (NOTE) The eGFR has been calculated using the CKD EPI equation. This calculation has not been validated in all clinical situations. eGFR's persistently <60 mL/min signify possible Chronic Kidney Disease.    Anion gap 10 5 - 15  I-Stat CG4 Lactic Acid, ED  (not at [facility])     Status: None   Collection Time: 04/06/15  7:11 PM  Result Value Ref Range   Lactic Acid, Venous 1.14 0.5 - 2.0 mmol/L  I-Stat CG4 Lactic Acid, ED     Status: None   Collection Time: 04/07/15  7:43 AM  Result Value Ref Range   Lactic Acid, Venous 0.98 0.5 - 2.0 mmol/L   Ct Abdomen Pelvis W Contrast  04/07/2015   CLINICAL DATA:  Abdominal surgery with open wound last week. Increase drainage from the wound today. Fever developing this afternoon. Increased pain.  EXAM: CT ABDOMEN AND PELVIS WITH CONTRAST  TECHNIQUE: Multidetector CT imaging of the abdomen and pelvis was performed using the standard protocol following bolus administration of intravenous contrast.  CONTRAST:  174m OMNIPAQUE IOHEXOL 300 MG/ML  SOLN  COMPARISON:  03/09/2015  FINDINGS: Small bilateral pleural effusions and basilar atelectasis, greater on the left. Coronary artery calcification.  Loculated left subdiaphragmatic fluid collection with thick enhancing wall and small gas bubbles. This is suspicious for other subdiaphragmatic abscess. The fluid collection measures about 6.8 x 9.9 cm maximal diameter. Spleen is displaced anteriorly but does not appear involved. There is skin defect in the left lower quadrant anterior subcutaneous fat with infiltration and skin clips along the midline of the lower abdominal and pelvic wall consistent with postoperative change. No subcutaneous fluid collection is demonstrated. There is apparent resection of the sigmoid colon with anastomosis at the rectosigmoid region and diverting right lower quadrant ileostomy. Contrast material flows through to the ileostomy without evidence of obstruction.  No small or large bowel distention. Colon is partially stool-filled and mostly decompressed.  Mild diffuse fatty infiltration of the liver. Gallbladder, pancreas, adrenal glands, inferior vena cava, and retroperitoneal lymph nodes are unremarkable. Calcification of abdominal aorta without aneurysm. Nephrograms are symmetrical. Mild hydronephrosis of the left  kidney without obvious obstructing lesion or stone.  Pelvis: Uterus and ovaries are not enlarged. There is a Foley catheter in the bladder. Gas in the bladder is likely related to Foley catheter. No free or loculated pelvic fluid collections. Iliac chain lymph nodes are not pathologically enlarged and are probably reactive. Diffuse bone demineralization. Degenerative changes in the lumbar spine and hips. No destructive bone lesion.  IMPRESSION: Postoperative changes with sigmoid resection and anastomosis and diverting right lower quadrant ileostomy. Skin clips and incision wounds in anterior abdominal wall. Left subdiaphragmatic abscess. Small bilateral pleural effusions and basilar atelectasis, greater on the left.   Electronically Signed   By: Lucienne Capers M.D.   On: 04/07/2015 01:22    Review of Systems  Constitutional: Positive for fever and chills.  Gastrointestinal: Negative for nausea, vomiting and abdominal pain.  Musculoskeletal: Positive for back pain.  All other systems reviewed and are negative.   Blood pressure 101/57, pulse 63, temperature 97.8 F (36.6 C), temperature source Axillary, resp. rate 15, SpO2 100 %. Physical Exam  Constitutional: She is oriented to person, place, and time. She appears well-developed and well-nourished.  HENT:  Head: Normocephalic and atraumatic.  Eyes: EOM are normal. Pupils are equal, round, and reactive to light.  Neck: Normal range of motion. Neck supple.  Cardiovascular: Normal rate, regular rhythm and normal heart sounds.   Respiratory: Effort normal and breath sounds normal.  GI: Normal  appearance and bowel sounds are normal. There is tenderness in the left upper quadrant. There is CVA tenderness (left).    Musculoskeletal: Normal range of motion.  Neurological: She is alert and oriented to person, place, and time. She has normal reflexes.  Skin: Skin is warm and dry.  Psychiatric: She has a normal mood and affect. Her behavior is normal. Judgment and thought content normal.     Assessment/Plan Postoperative left subdiaphragmatic abscess with fevers and leukocytosis. IR drainage and antibiotics. Restart diet after drainage.  Elimelech Houseman 04/07/2015, 7:49 AM

## 2015-04-07 NOTE — ED Notes (Signed)
Dr.Wyatt at the bedside

## 2015-04-07 NOTE — H&P (Signed)
Referring Physician(s): Dr. Hulen Skains  Subjective: 79 yo female with hx of major abdominal surgery. Now admitted with fevers and abd pain. Imaging reveals post operative left subdiaphragmatic abscess IR requested to place perc drain Chart, PMHx, meds, imaging, labs reviewed.  Past Medical History  Diagnosis Date  . Anemia   . Arthritis   . Cancer   . GERD (gastroesophageal reflux disease)   . Anxiety   . Depression   . Breast cancer, right breast 07/04/2013    ILC, 1.8 cm, neg sln receptor+, surgery on 08/01/13   . Hypertension   . Diabetes mellitus without complication     pt stated she is no longer diabetic and is not required to take any glucose meds  . Colovesical fistula    Past Surgical History  Procedure Laterality Date  . Ovary surgery      removed ovaries  . Salpingoophorectomy    . Breast lumpectomy with needle localization and axillary sentinel lymph node bx Right 08/01/2013    Procedure: BREAST LUMPECTOMY WITH NEEDLE LOCALIZATION AND AXILLARY SENTINEL LYMPH NODE BIOPSY;  Surgeon: Haywood Lasso, MD;  Location: [place];  Service: General;  Laterality: Right;  . Colostomy N/A 01/21/2015    Procedure: DIVERTING LOOP COLOSTOMY;  Surgeon: Doreen Salvage, MD;  Location: [place];  Service: General;  Laterality: N/A;  . Appendectomy  1966  . Tubal ligation    . Tonsillectomy    . Partial colectomy N/A 03/26/2015    Procedure: COLOSTOMY TAKEDOWN, PARTIAL COLECTOMY, BLADDER REPAIR, DIVERTING LOOP ILEOSTOMY;  Surgeon: Judeth Horn, MD;  Location: [place];  Service: General;  Laterality: N/A;  . Cystoscopy with stent placement N/A 03/26/2015    Procedure: CYSTOSCOPY WITH STENT PLACEMENT;  Surgeon: Kathie Rhodes, MD;  Location: [place];  Service: Urology;  Laterality: N/A;   History   Social History  . Marital Status: Married    Spouse Name: N/A  . Number of Children: N/A  . Years of Education: N/A   Occupational History  . Not on file.   Social History Main Topics    . Smoking status: Never Smoker   . Smokeless tobacco: Never Used  . Alcohol Use: No  . Drug Use: No  . Sexual Activity: Not Currently   Other Topics Concern  . Not on file   Social History Narrative    Allergies: Aspirin; Ciprofloxacin; Codeine; and Sulfur  Medications:  Current facility-administered medications:  .  acetaminophen (TYLENOL) tablet 500-1,000 mg, 500-1,000 mg, Oral, Q6H PRN, Judeth Horn, MD .  ALPRAZolam Duanne Moron) tablet 0.125 mg, 0.125 mg, Oral, QHS PRN, Judeth Horn, MD .  anastrozole (ARIMIDEX) tablet 1 mg, 1 mg, Oral, Daily, Judeth Horn, MD .  citalopram (CELEXA) tablet 10 mg, 10 mg, Oral, QHS, Judeth Horn, MD .  dextrose 5 % and 0.45 % NaCl with KCl 20 mEq/L infusion, , Intravenous, Continuous, Judeth Horn, MD .  enoxaparin (LOVENOX) injection 40 mg, 40 mg, Subcutaneous, Q24H, Judeth Horn, MD .  feeding supplement (PRO-STAT SUGAR FREE 64) liquid 30 mL, 30 mL, Oral, BID, Judeth Horn, MD .  feeding supplement (RESOURCE BREEZE) (RESOURCE BREEZE) liquid 1 Container, 1 Container, Oral, BID BM, Judeth Horn, MD .  ferrous sulfate tablet 325 mg, 325 mg, Oral, BID WC, Judeth Horn, MD .  HYDROmorphone (DILAUDID) injection 0.5-1 mg, 0.5-1 mg, Intravenous, Q3H PRN, Judeth Horn, MD .  losartan (COZAAR) tablet 100 mg, 100 mg, Oral, Daily, Judeth Horn, MD .  metroNIDAZOLE (FLAGYL) IVPB 500 mg, 500  mg, Intravenous, Q8H, Judeth Horn, MD .  ondansetron [facility]) injection 4 mg, 4 mg, Intravenous, Q4H PRN, Judeth Horn, MD .  oxyCODONE-acetaminophen (PERCOCET/ROXICET) 5-325 MG per tablet 1-2 tablet, 1-2 tablet, Oral, Q4H PRN, Judeth Horn, MD .  piperacillin-tazobactam (ZOSYN) IVPB 3.375 g, 3.375 g, Intravenous, Q8H, Veronda P Bryk, RPH, Last Rate: 12.5 mL/hr at 04/07/15 0852, 3.375 g at 04/07/15 7322  Current outpatient prescriptions:  .  acetaminophen (TYLENOL) 500 MG tablet, Take 500-1,000 mg by mouth every 6 (six) hours as needed (pain)., Disp: , Rfl:  .  ALPRAZolam (XANAX) 0.25  MG tablet, Take 0.125 mg by mouth at bedtime as needed for sleep. , Disp: , Rfl:  .  anastrozole (ARIMIDEX) 1 MG tablet, Take 1 tablet (1 mg total) by mouth daily., Disp: 90 tablet, Rfl: 6 .  citalopram (CELEXA) 20 MG tablet, Take 10 mg by mouth at bedtime. , Disp: , Rfl:  .  losartan (COZAAR) 100 MG tablet, Take 100 mg by mouth daily., Disp: , Rfl:  .  oxyCODONE-acetaminophen (PERCOCET/ROXICET) 5-325 MG per tablet, Take 1-2 tablets by mouth every 4 (four) hours as needed for moderate pain., Disp: 40 tablet, Rfl: 0 .  ranitidine (ZANTAC) 150 MG tablet, Take 150 mg by mouth 2 (two) times daily as needed for heartburn. , Disp: , Rfl:  .  Amino Acids-Protein Hydrolys (FEEDING SUPPLEMENT, PRO-STAT SUGAR FREE 64,) LIQD, Take 30 mLs by mouth 2 (two) times daily. (Patient not taking: Reported on 03/19/2015), Disp: 900 mL, Rfl: 0 .  feeding supplement, RESOURCE BREEZE, (RESOURCE BREEZE) LIQD, Take 1 Container by mouth 2 (two) times daily between meals. (Patient not taking: Reported on 04/06/2015), Disp: 237 mL, Rfl: 12 .  ondansetron (ZOFRAN) 4 MG/2ML SOLN injection, Inject 2 mLs (4 mg total) into the vein every 4 (four) hours as needed for nausea or vomiting. (Patient not taking: Reported on 04/06/2015), Disp: 2 mL, Rfl: 0   Review of Systems  Vital Signs: BP 125/57 mmHg  Pulse 66  Temp(Src) 97.8 F (36.6 C) (Axillary)  Resp 25  SpO2 100%  Physical Exam General: NAD, A&O x 3 ENT: unremarkable airway Lungs: CTA without w/r/r Heart: Regular Abdomen: Soft, ileostomy viable, mild tenderness LUQ  Imaging: Ct Abdomen Pelvis W Contrast  04/07/2015   CLINICAL DATA:  Abdominal surgery with open wound last week. Increase drainage from the wound today. Fever developing this afternoon. Increased pain.  EXAM: CT ABDOMEN AND PELVIS WITH CONTRAST  TECHNIQUE: Multidetector CT imaging of the abdomen and pelvis was performed using the standard protocol following bolus administration of intravenous contrast.   CONTRAST:  163mL OMNIPAQUE IOHEXOL 300 MG/ML  SOLN  COMPARISON:  03/09/2015  FINDINGS: Small bilateral pleural effusions and basilar atelectasis, greater on the left. Coronary artery calcification.  Loculated left subdiaphragmatic fluid collection with thick enhancing wall and small gas bubbles. This is suspicious for other subdiaphragmatic abscess. The fluid collection measures about 6.8 x 9.9 cm maximal diameter. Spleen is displaced anteriorly but does not appear involved. There is skin defect in the left lower quadrant anterior subcutaneous fat with infiltration and skin clips along the midline of the lower abdominal and pelvic wall consistent with postoperative change. No subcutaneous fluid collection is demonstrated. There is apparent resection of the sigmoid colon with anastomosis at the rectosigmoid region and diverting right lower quadrant ileostomy. Contrast material flows through to the ileostomy without evidence of obstruction. No small or large bowel distention. Colon is partially stool-filled and mostly decompressed.  Mild diffuse fatty infiltration  of the liver. Gallbladder, pancreas, adrenal glands, inferior vena cava, and retroperitoneal lymph nodes are unremarkable. Calcification of abdominal aorta without aneurysm. Nephrograms are symmetrical. Mild hydronephrosis of the left kidney without obvious obstructing lesion or stone.  Pelvis: Uterus and ovaries are not enlarged. There is a Foley catheter in the bladder. Gas in the bladder is likely related to Foley catheter. No free or loculated pelvic fluid collections. Iliac chain lymph nodes are not pathologically enlarged and are probably reactive. Diffuse bone demineralization. Degenerative changes in the lumbar spine and hips. No destructive bone lesion.  IMPRESSION: Postoperative changes with sigmoid resection and anastomosis and diverting right lower quadrant ileostomy. Skin clips and incision wounds in anterior abdominal wall. Left  subdiaphragmatic abscess. Small bilateral pleural effusions and basilar atelectasis, greater on the left.   Electronically Signed   By: Lucienne Capers M.D.   On: 04/07/2015 01:22    Labs:  CBC:  Recent Labs  03/30/15 0322 03/31/15 0455 04/06/15 1849 04/07/15 0738  WBC 19.8* 15.5* 21.1* 14.6*  HGB 8.8* 8.8* 9.0* 7.9*  HCT 27.7* 27.8* 27.9* 24.6*  PLT 228 237 487* 470*    COAGS:  Recent Labs  01/16/15 0905 02/02/15 0008  INR 1.13 1.27    BMP:  Recent Labs  03/30/15 0322 03/31/15 0455 04/06/15 1849 04/07/15 0738  NA 139 137 133* 136  K 3.6 3.5 3.8 3.4*  CL 107 103 95* 96*  CO2 26 24 28 30   GLUCOSE 149* 111* 149* 87  BUN 13 9 8 7   CALCIUM 8.7* 8.6* 8.1* 8.1*  CREATININE 0.65 0.45 0.55 0.51  GFRNONAA >60 >60 >60 >60  GFRAA >60 >60 >60 >60    LIVER FUNCTION TESTS:  Recent Labs  01/25/15 0440 01/31/15 0517 03/24/15 0842 04/06/15 1849  BILITOT 0.1* 0.5 0.5 0.7  AST 23 17 18 19   ALT 11 12 12 14   ALKPHOS 98 100 96 175*  PROT 5.8* 5.7* 7.2 6.5  ALBUMIN 1.6* 1.6* 3.0* 1.8*    Assessment and Plan: Postoperative left subdiaphragmatic abscess For CT guided perc drainage today Labs ok Risks and Benefits discussed with the patient including bleeding, infection, damage to adjacent structures, bowel perforation/fistula connection, and sepsis. All of the patient's questions were answered, patient is agreeable to proceed. Consent signed and in chart.     I spent a total of 20 minutes face to face in clinical consultation/evaluation, greater than 50% of which was counseling/coordinating care for abscess drainage  Signed: Ascencion Dike 04/07/2015, 9:47 AM

## 2015-04-07 NOTE — Procedures (Signed)
CT LUQ 87f abscess drain cath 30ml amber fluid out, for GS, C&S No complication No blood loss. See complete dictation in [facility].

## 2015-04-07 NOTE — ED Notes (Signed)
IR has seen pt and had a consent signed for procedure.

## 2015-04-07 NOTE — Consult Note (Addendum)
WOC consult requested; pt is currently in a procedure this afternoon. She is familiar to [facility] team from a previous admission, consult will be performed tomorrow to assess abd wound as requested.  Ostomy supplies left at bedside for staff nurse use. Julien Girt MSN, RN, [place], [place], [place]

## 2015-04-07 NOTE — Sedation Documentation (Signed)
Dr. Vernard Gambles suturing drain line in place. Pt tolerating well.  Stat lock placed to keep drain in place.

## 2015-04-07 NOTE — ED Notes (Signed)
Lab at the bedside 

## 2015-04-07 NOTE — Progress Notes (Signed)
Report called to 6N RN for patient transfer

## 2015-04-08 DIAGNOSIS — Z853 Personal history of malignant neoplasm of breast: Secondary | ICD-10-CM | POA: Diagnosis not present

## 2015-04-08 DIAGNOSIS — Z932 Ileostomy status: Secondary | ICD-10-CM | POA: Diagnosis not present

## 2015-04-08 DIAGNOSIS — F419 Anxiety disorder, unspecified: Secondary | ICD-10-CM | POA: Diagnosis present

## 2015-04-08 DIAGNOSIS — F329 Major depressive disorder, single episode, unspecified: Secondary | ICD-10-CM | POA: Diagnosis present

## 2015-04-08 DIAGNOSIS — Z886 Allergy status to analgesic agent status: Secondary | ICD-10-CM | POA: Diagnosis not present

## 2015-04-08 DIAGNOSIS — Z885 Allergy status to narcotic agent status: Secondary | ICD-10-CM | POA: Diagnosis not present

## 2015-04-08 DIAGNOSIS — Z881 Allergy status to other antibiotic agents status: Secondary | ICD-10-CM | POA: Diagnosis not present

## 2015-04-08 DIAGNOSIS — I10 Essential (primary) hypertension: Secondary | ICD-10-CM | POA: Diagnosis present

## 2015-04-08 DIAGNOSIS — T814XXA Infection following a procedure, initial encounter: Secondary | ICD-10-CM | POA: Diagnosis present

## 2015-04-08 DIAGNOSIS — K5792 Diverticulitis of intestine, part unspecified, without perforation or abscess without bleeding: Secondary | ICD-10-CM | POA: Diagnosis present

## 2015-04-08 DIAGNOSIS — K219 Gastro-esophageal reflux disease without esophagitis: Secondary | ICD-10-CM | POA: Diagnosis present

## 2015-04-08 DIAGNOSIS — Z888 Allergy status to other drugs, medicaments and biological substances status: Secondary | ICD-10-CM | POA: Diagnosis not present

## 2015-04-08 DIAGNOSIS — K651 Peritoneal abscess: Secondary | ICD-10-CM | POA: Diagnosis present

## 2015-04-08 DIAGNOSIS — E119 Type 2 diabetes mellitus without complications: Secondary | ICD-10-CM | POA: Diagnosis present

## 2015-04-08 DIAGNOSIS — Y838 Other surgical procedures as the cause of abnormal reaction of the patient, or of later complication, without mention of misadventure at the time of the procedure: Secondary | ICD-10-CM | POA: Diagnosis present

## 2015-04-08 LAB — CBC
HEMATOCRIT: 21.2 % — AB (ref 36.0–46.0)
Hemoglobin: 7 g/dL — ABNORMAL LOW (ref 12.0–15.0)
MCH: 30.7 pg (ref 26.0–34.0)
MCHC: 33 g/dL (ref 30.0–36.0)
MCV: 93 fL (ref 78.0–100.0)
Platelets: 461 10*3/uL — ABNORMAL HIGH (ref 150–400)
RBC: 2.28 MIL/uL — AB (ref 3.87–5.11)
RDW: 14.3 % (ref 11.5–15.5)
WBC: 12.1 10*3/uL — AB (ref 4.0–10.5)

## 2015-04-08 LAB — BASIC METABOLIC PANEL
Anion gap: 5 (ref 5–15)
BUN: 8 mg/dL (ref 6–20)
CALCIUM: 7.8 mg/dL — AB (ref 8.9–10.3)
CO2: 29 mmol/L (ref 22–32)
CREATININE: 0.57 mg/dL (ref 0.44–1.00)
Chloride: 101 mmol/L (ref 101–111)
Glucose, Bld: 129 mg/dL — ABNORMAL HIGH (ref 65–99)
Potassium: 3.9 mmol/L (ref 3.5–5.1)
Sodium: 135 mmol/L (ref 135–145)

## 2015-04-08 LAB — TYPE AND SCREEN
ABO/RH(D): A POS
ANTIBODY SCREEN: NEGATIVE

## 2015-04-08 MED ORDER — BACITRACIN 500 UNIT/GM EX OINT
1.0000 "application " | TOPICAL_OINTMENT | Freq: Two times a day (BID) | CUTANEOUS | Status: DC
  Filled 2015-04-08: qty 0.9

## 2015-04-08 MED ORDER — BACITRACIN ZINC 500 UNIT/GM EX OINT
TOPICAL_OINTMENT | Freq: Two times a day (BID) | CUTANEOUS | Status: DC
  Administered 2015-04-08 – 2015-04-09 (×2): via TOPICAL
  Administered 2015-04-09: 1 via TOPICAL
  Administered 2015-04-10: 11:00:00 via TOPICAL
  Filled 2015-04-08: qty 28.35

## 2015-04-08 MED ORDER — BOOST / RESOURCE BREEZE PO LIQD
1.0000 | Freq: Three times a day (TID) | ORAL | Status: DC
  Administered 2015-04-08 – 2015-04-10 (×4): 1 via ORAL

## 2015-04-08 MED ORDER — SODIUM CHLORIDE 0.9 % IV SOLN: Freq: Once | INTRAVENOUS | Status: DC

## 2015-04-08 MED ORDER — ADULT MULTIVITAMIN W/MINERALS CH
1.0000 | ORAL_TABLET | Freq: Every day | ORAL | Status: DC
  Administered 2015-04-08 – 2015-04-10 (×3): 1 via ORAL
  Filled 2015-04-08 (×3): qty 1

## 2015-04-08 NOTE — Progress Notes (Signed)
Initial Nutrition Assessment  DOCUMENTATION CODES:  Obesity unspecified  INTERVENTION:  Boost Breeze, MVI  NUTRITION DIAGNOSIS:  Inadequate oral intake related to altered GI function as evidenced by per patient/family report.   GOAL:  Patient will meet greater than or equal to 90% of their needs   MONITOR:  PO intake, Supplement acceptance, Labs, Weight trends, I & O's, Skin  REASON FOR ASSESSMENT:  Malnutrition Screening Tool    ASSESSMENT: Patient recently discharged, brought back after she had fevers at home up to 100.7, had chills, no nausea or vomiting. Ileostomy is functioning well. Eating well. CT scan done in the ED demonstrates a left large subdiaphragmatic abscess. IR has been called for percutaneous drainage and catheter placement.  Pt s/p CT guided perc drainage on 04/07/15 for postoperative left subdiaphragmatic abscess.  Pt familiar to this RD due to multiple admissions.   Pt reports her appetite has been improving both PTA and currently. She reports she consumed a biscuit, most of her eggs, and bacon at breakfast. Estimate 50-75% meal completion per pt report. She reports that she has been able to add more variety in her diet, such as bread and boiled eggs. She complains of early satiety and often eats several small meals daily, including protein bars (which she reports have 20 grams of protein per bar) and one carton of [employer], which she was able to obtain through special order at her home pharmacy.   Pt reports that she knows that she has weight loss, but noted that is appears that she is gaining some of her weight back. It is difficult to tell if weight gain is from improved nutritional status or fluid. Pt with mild edema on lower extremities per exam.   Pt agreeable to continue with Resource Breeze supplement, but reports she does not like the Prostat. Discussed importance of good meal and supplement intake to promote nutritional adequacy.  Encouraged pt to continue supplements at home.   Pt with RLQ loop ileostomy- noted 0 ml output this shift.  Labs reviewed. Calcium: 7.8, Glucose: 129.   Height:  Ht Readings from Last 1 Encounters:  04/07/15 5\' 3"  (1.6 m)    Weight:  Wt Readings from Last 1 Encounters:  04/07/15 217 lb 2.5 oz (98.5 kg)    Ideal Body Weight:  70.4 kg  Wt Readings from Last 10 Encounters:  04/07/15 217 lb 2.5 oz (98.5 kg)  03/27/15 183 lb 6.8 oz (83.2 kg)  02/05/15 199 lb 11.2 oz (90.583 kg)  08/25/14 219 lb (99.338 kg)  02/09/14 220 lb 4.8 oz (99.927 kg)  11/10/13 213 lb 8 oz (96.843 kg)  09/22/13 211 lb 4.8 oz (95.845 kg)  09/02/13 209 lb 9.6 oz (95.074 kg)  08/19/13 210 lb 3.2 oz (95.346 kg)  08/01/13 208 lb (94.348 kg)    BMI:  Body mass index is 38.48 kg/(m^2). Obesity, class II  Estimated Nutritional Needs:  Kcal:  1700-1900  Protein:  80-90 grams  Fluid:  1.7-1.9 L  Skin:  Wound (see comment) (closed mid abbdominal incision, open lt lower abdominal incision, closed sytem drain on medical back, ileostomy)  Diet Order:  Diet regular Room service appropriate?: Yes; Fluid consistency:: Thin  EDUCATION NEEDS:  Education needs addressed   Intake/Output Summary (Last 24 hours) at 04/08/15 1048 Last data filed at 04/08/15 0602  Gross per 24 hour  Intake    105 ml  Output   2900 ml  Net  -2795 ml    Last BM:  04/07/15  Raydon Chappuis A. Jimmye Norman, RD, LDN, CDE Pager: [phone] After hours Pager: [phone]

## 2015-04-08 NOTE — Progress Notes (Signed)
GS Progress Note Subjective: Patient looks good but has not been out of bed.  Still has some left sided pain, but not as severe  Objective: Vital signs in last 24 hours: Temp:  [98.4 F (36.9 C)-99.3 F (37.4 C)] 98.6 F (37 C) (05/12 1451) Pulse Rate:  [76-89] 85 (05/12 1451) Resp:  [16-18] 16 (05/12 1451) BP: (109-136)/(55-70) 136/70 mmHg (05/12 1451) SpO2:  [91 %-95 %] 95 % (05/12 1451) Last BM Date: 04/08/15  Intake/Output from previous day: 05/11 0701 - 05/12 0700 In: 105 [IV Piggyback:100] Out: 3100 [Urine:2600; Drains:300; Stool:200] Intake/Output this shift: Total I/O In: 660 [I.V.:574; Other:5; IV Piggyback:81] Out: 1 [Urine:500; Drains:40]  Lungs: Clear  Abd: Soft, good bowel sounds.  Extremities: No DVT signs or symptoms  Neuro: Intact  Lab Results: CBC   Recent Labs  04/07/15 0738 04/08/15 0344  WBC 14.6* 12.1*  HGB 7.9* 7.0*  HCT 24.6* 21.2*  PLT 470* 461*   BMET  Recent Labs  04/07/15 0738 04/08/15 0344  NA 136 135  K 3.4* 3.9  CL 96* 101  CO2 30 29  GLUCOSE 87 129*  BUN 7 8  CREATININE 0.51 0.57  CALCIUM 8.1* 7.8*   PT/INR No results for input(s): LABPROT, INR in the last 72 hours. ABG No results for input(s): PHART, HCO3 in the last 72 hours.  Invalid input(s): PCO2, PO2  Studies/Results: Ct Abdomen Pelvis W Contrast  04/07/2015   CLINICAL DATA:  Abdominal surgery with open wound last week. Increase drainage from the wound today. Fever developing this afternoon. Increased pain.  EXAM: CT ABDOMEN AND PELVIS WITH CONTRAST  TECHNIQUE: Multidetector CT imaging of the abdomen and pelvis was performed using the standard protocol following bolus administration of intravenous contrast.  CONTRAST:  161mL OMNIPAQUE IOHEXOL 300 MG/ML  SOLN  COMPARISON:  03/09/2015  FINDINGS: Small bilateral pleural effusions and basilar atelectasis, greater on the left. Coronary artery calcification.  Loculated left subdiaphragmatic fluid collection with  thick enhancing wall and small gas bubbles. This is suspicious for other subdiaphragmatic abscess. The fluid collection measures about 6.8 x 9.9 cm maximal diameter. Spleen is displaced anteriorly but does not appear involved. There is skin defect in the left lower quadrant anterior subcutaneous fat with infiltration and skin clips along the midline of the lower abdominal and pelvic wall consistent with postoperative change. No subcutaneous fluid collection is demonstrated. There is apparent resection of the sigmoid colon with anastomosis at the rectosigmoid region and diverting right lower quadrant ileostomy. Contrast material flows through to the ileostomy without evidence of obstruction. No small or large bowel distention. Colon is partially stool-filled and mostly decompressed.  Mild diffuse fatty infiltration of the liver. Gallbladder, pancreas, adrenal glands, inferior vena cava, and retroperitoneal lymph nodes are unremarkable. Calcification of abdominal aorta without aneurysm. Nephrograms are symmetrical. Mild hydronephrosis of the left kidney without obvious obstructing lesion or stone.  Pelvis: Uterus and ovaries are not enlarged. There is a Foley catheter in the bladder. Gas in the bladder is likely related to Foley catheter. No free or loculated pelvic fluid collections. Iliac chain lymph nodes are not pathologically enlarged and are probably reactive. Diffuse bone demineralization. Degenerative changes in the lumbar spine and hips. No destructive bone lesion.  IMPRESSION: Postoperative changes with sigmoid resection and anastomosis and diverting right lower quadrant ileostomy. Skin clips and incision wounds in anterior abdominal wall. Left subdiaphragmatic abscess. Small bilateral pleural effusions and basilar atelectasis, greater on the left.   Electronically Signed   By: Gwyndolyn Saxon  Gerilyn Nestle M.D.   On: 04/07/2015 01:22   Ct Image Guided Drainage By Percutaneous Catheter  04/07/2015   CLINICAL DATA:   Post colostomy takedown, partial colectomy, diverting loop ileostomy. Fever, abdominal pain. CT demonstrates new large subphrenic collection.  EXAM: CT GUIDED DRAINAGE OF LEFT SUBPHRENIC ABSCESS  ANESTHESIA/SEDATION: Intravenous Fentanyl and Versed were administered as conscious sedation during continuous cardiorespiratory monitoring by the radiology RN, with a total moderate sedation time of 14 minutes.  PROCEDURE: The procedure, risks, benefits, and alternatives were explained to the patient. Questions regarding the procedure were encouraged and answered. The patient understands and consents to the procedure.  Patient placed in right lateral decubitus position. Limited axial scans through the upper abdomen obtained. The collection was localized, an appropriate skin entry site determined and marked.  The operative field was prepped with Betadinein a sterile fashion, and a sterile drape was applied covering the operative field. A sterile gown and sterile gloves were used for the procedure. Local anesthesia was provided with 1% Lidocaine.  Under CT fluoroscopic guidance, an 18 gauge trocar needle was advanced into the collection using a low posterior approach to avoid the pleura. Once needle was positioned, amber yellow fluid could be aspirated. An Amplatz guidewire advanced easily within the collection, its position confirmed on CT. Tract dilated to facilitate placement of a 12 French pigtail catheter. Approximately 60 mL of amber fluid were aspirated, sent for Gram stain, culture and sensitivity. The culture was secured externally with 0 Prolene suture and StatLock, and placed to gravity drain bag. The patient tolerated the procedure well.  COMPLICATIONS: None immediate  FINDINGS: Limits CT gain confirms the left subphrenic collection. Under CT fluoroscopic guidance, a 12 French drain catheter was placed.  IMPRESSION: 1. Technically successful CT-guided placement of 12 French left subphrenic abscess drain catheter.    Electronically Signed   By: Lucrezia Europe M.D.   On: 04/07/2015 16:07    Anti-infectives: Anti-infectives    Start     Dose/Rate Route Frequency Ordered Stop   04/07/15 0945  metroNIDAZOLE (FLAGYL) IVPB 500 mg  Status:  Discontinued     500 mg 100 mL/hr over 60 Minutes Intravenous Every 8 hours 04/07/15 0941 04/07/15 1648   04/07/15 0800  piperacillin-tazobactam (ZOSYN) IVPB 3.375 g     3.375 g 12.5 mL/hr over 240 Minutes Intravenous Every 8 hours 04/07/15 0710     04/07/15 0215  piperacillin-tazobactam (ZOSYN) IVPB 3.375 g     3.375 g 100 mL/hr over 30 Minutes Intravenous  Once 04/07/15 0202 04/07/15 0315      Assessment/Plan: s/p  Decrease IV rate to [facility]  Physical therapy tomorrow Admit Dressing changes So far the drainage from the fluid collection in the left subdiaphragmatic area is sterile    [name]. [name], MD, FACS [phone] [facility] Surgery 04/08/2015

## 2015-04-08 NOTE — Consult Note (Signed)
[place] familiar with this patient, by to visit she is eating her lunch.  Abdominal pain improved.  Pouch intact and supplies in room for change if needed. Family independent with ostomy care and patient aware of steps of ostomy pouch change.  Family performs all ostomy care.  WOC will follow up in the am for ostomy pouch change. Charleigh Correnti Fillmore RN,CWOCN [phone]

## 2015-04-08 NOTE — Progress Notes (Signed)
Referring Physician(s): CCS  Subjective: Patient states her abdominal pain has improved since yesterday. She is tolerating regular diet without N/V  Allergies: Aspirin; Ciprofloxacin; Codeine; and Sulfur  Medications: Prior to Admission medications   Medication Sig Start Date End Date Taking? Authorizing Provider  acetaminophen (TYLENOL) 500 MG tablet Take 500-1,000 mg by mouth every 6 (six) hours as needed (pain).   Yes Historical Provider, MD  ALPRAZolam (XANAX) 0.25 MG tablet Take 0.125 mg by mouth at bedtime as needed for sleep.    Yes Historical Provider, MD  anastrozole (ARIMIDEX) 1 MG tablet Take 1 tablet (1 mg total) by mouth daily. 02/09/14  Yes Consuela Mimes, MD  citalopram (CELEXA) 20 MG tablet Take 10 mg by mouth at bedtime.  01/03/15  Yes Historical Provider, MD  losartan (COZAAR) 100 MG tablet Take 100 mg by mouth daily.   Yes Historical Provider, MD  oxyCODONE-acetaminophen (PERCOCET/ROXICET) 5-325 MG per tablet Take 1-2 tablets by mouth every 4 (four) hours as needed for moderate pain. 04/02/15  Yes Judeth Horn, MD  ranitidine (ZANTAC) 150 MG tablet Take 150 mg by mouth 2 (two) times daily as needed for heartburn.  01/05/15  Yes Historical Provider, MD  Amino Acids-Protein Hydrolys (FEEDING SUPPLEMENT, PRO-STAT SUGAR FREE 64,) LIQD Take 30 mLs by mouth 2 (two) times daily. Patient not taking: Reported on 03/19/2015 02/05/15   Erby Pian, NP  feeding supplement, RESOURCE BREEZE, (RESOURCE BREEZE) LIQD Take 1 Container by mouth 2 (two) times daily between meals. Patient not taking: Reported on 04/06/2015 02/05/15   Emina Riebock, NP  ondansetron (ZOFRAN) 4 MG/2ML SOLN injection Inject 2 mLs (4 mg total) into the vein every 4 (four) hours as needed for nausea or vomiting. Patient not taking: Reported on 04/06/2015 04/02/15   Judeth Horn, MD    Vital Signs: BP 129/64 mmHg  Pulse 76  Temp(Src) 98.6 F (37 C) (Oral)  Resp 16  Ht 5\' 3"  (1.6 m)  Wt 217 lb 2.5 oz (98.5 kg)  BMI  38.48 kg/m2  SpO2 91%  Physical Exam General: A&Ox3, NAD Abd: Soft, NT, Left flank drain dressing C/D/I, no signs of bleeding/hematoma, 300 cc/24 hrs, 25 cc serous purulent stranding output in bag  Imaging: Ct Abdomen Pelvis W Contrast  04/07/2015   CLINICAL DATA:  Abdominal surgery with open wound last week. Increase drainage from the wound today. Fever developing this afternoon. Increased pain.  EXAM: CT ABDOMEN AND PELVIS WITH CONTRAST  TECHNIQUE: Multidetector CT imaging of the abdomen and pelvis was performed using the standard protocol following bolus administration of intravenous contrast.  CONTRAST:  169mL OMNIPAQUE IOHEXOL 300 MG/ML  SOLN  COMPARISON:  03/09/2015  FINDINGS: Small bilateral pleural effusions and basilar atelectasis, greater on the left. Coronary artery calcification.  Loculated left subdiaphragmatic fluid collection with thick enhancing wall and small gas bubbles. This is suspicious for other subdiaphragmatic abscess. The fluid collection measures about 6.8 x 9.9 cm maximal diameter. Spleen is displaced anteriorly but does not appear involved. There is skin defect in the left lower quadrant anterior subcutaneous fat with infiltration and skin clips along the midline of the lower abdominal and pelvic wall consistent with postoperative change. No subcutaneous fluid collection is demonstrated. There is apparent resection of the sigmoid colon with anastomosis at the rectosigmoid region and diverting right lower quadrant ileostomy. Contrast material flows through to the ileostomy without evidence of obstruction. No small or large bowel distention. Colon is partially stool-filled and mostly decompressed.  Mild diffuse fatty infiltration  of the liver. Gallbladder, pancreas, adrenal glands, inferior vena cava, and retroperitoneal lymph nodes are unremarkable. Calcification of abdominal aorta without aneurysm. Nephrograms are symmetrical. Mild hydronephrosis of the left kidney without  obvious obstructing lesion or stone.  Pelvis: Uterus and ovaries are not enlarged. There is a Foley catheter in the bladder. Gas in the bladder is likely related to Foley catheter. No free or loculated pelvic fluid collections. Iliac chain lymph nodes are not pathologically enlarged and are probably reactive. Diffuse bone demineralization. Degenerative changes in the lumbar spine and hips. No destructive bone lesion.  IMPRESSION: Postoperative changes with sigmoid resection and anastomosis and diverting right lower quadrant ileostomy. Skin clips and incision wounds in anterior abdominal wall. Left subdiaphragmatic abscess. Small bilateral pleural effusions and basilar atelectasis, greater on the left.   Electronically Signed   By: Lucienne Capers M.D.   On: 04/07/2015 01:22   Ct Image Guided Drainage By Percutaneous Catheter  04/07/2015   CLINICAL DATA:  Post colostomy takedown, partial colectomy, diverting loop ileostomy. Fever, abdominal pain. CT demonstrates new large subphrenic collection.  EXAM: CT GUIDED DRAINAGE OF LEFT SUBPHRENIC ABSCESS  ANESTHESIA/SEDATION: Intravenous Fentanyl and Versed were administered as conscious sedation during continuous cardiorespiratory monitoring by the radiology RN, with a total moderate sedation time of 14 minutes.  PROCEDURE: The procedure, risks, benefits, and alternatives were explained to the patient. Questions regarding the procedure were encouraged and answered. The patient understands and consents to the procedure.  Patient placed in right lateral decubitus position. Limited axial scans through the upper abdomen obtained. The collection was localized, an appropriate skin entry site determined and marked.  The operative field was prepped with Betadinein a sterile fashion, and a sterile drape was applied covering the operative field. A sterile gown and sterile gloves were used for the procedure. Local anesthesia was provided with 1% Lidocaine.  Under CT fluoroscopic  guidance, an 18 gauge trocar needle was advanced into the collection using a low posterior approach to avoid the pleura. Once needle was positioned, amber yellow fluid could be aspirated. An Amplatz guidewire advanced easily within the collection, its position confirmed on CT. Tract dilated to facilitate placement of a 12 French pigtail catheter. Approximately 60 mL of amber fluid were aspirated, sent for Gram stain, culture and sensitivity. The culture was secured externally with 0 Prolene suture and StatLock, and placed to gravity drain bag. The patient tolerated the procedure well.  COMPLICATIONS: None immediate  FINDINGS: Limits CT gain confirms the left subphrenic collection. Under CT fluoroscopic guidance, a 12 French drain catheter was placed.  IMPRESSION: 1. Technically successful CT-guided placement of 12 French left subphrenic abscess drain catheter.   Electronically Signed   By: Lucrezia Europe M.D.   On: 04/07/2015 16:07    Labs:  CBC:  Recent Labs  03/31/15 0455 04/06/15 1849 04/07/15 0738 04/08/15 0344  WBC 15.5* 21.1* 14.6* 12.1*  HGB 8.8* 9.0* 7.9* 7.0*  HCT 27.8* 27.9* 24.6* 21.2*  PLT 237 487* 470* 461*    COAGS:  Recent Labs  01/16/15 0905 02/02/15 0008  INR 1.13 1.27    BMP:  Recent Labs  03/31/15 0455 04/06/15 1849 04/07/15 0738 04/08/15 0344  NA 137 133* 136 135  K 3.5 3.8 3.4* 3.9  CL 103 95* 96* 101  CO2 24 28 30 29   GLUCOSE 111* 149* 87 129*  BUN 9 8 7 8   CALCIUM 8.6* 8.1* 8.1* 7.8*  CREATININE 0.45 0.55 0.51 0.57  GFRNONAA >60 >60 >60 >60  GFRAA >60 >60 >60 >60    LIVER FUNCTION TESTS:  Recent Labs  01/25/15 0440 01/31/15 0517 03/24/15 0842 04/06/15 1849  BILITOT 0.1* 0.5 0.5 0.7  AST 23 17 18 19   ALT 11 12 12 14   ALKPHOS 98 100 96 175*  PROT 5.8* 5.7* 7.2 6.5  ALBUMIN 1.6* 1.6* 3.0* 1.8*    Assessment and Plan: Colostomy takedown, partial colectomy, bladder repair, diverting loop ileostomy and cystoscopy with stent placement  03/26/15 Post operative left subdiaphragmatic fluid collection S/p perc drain placed in CT 5/11 Good serous with purulent stranding output, wbc trending down, Cx pending, afebrile Plans per CCS   Signed: Tsosie Billing D 04/08/2015, 12:17 PM   I spent a total of 15 Minutes in face to face in clinical consultation/evaluation, greater than 50% of which was counseling/coordinating care for abdominal abscess.

## 2015-04-09 LAB — CBC WITH DIFFERENTIAL/PLATELET
BASOS PCT: 0 % (ref 0–1)
Basophils Absolute: 0 10*3/uL (ref 0.0–0.1)
EOS ABS: 0.2 10*3/uL (ref 0.0–0.7)
EOS PCT: 2 % (ref 0–5)
HCT: 24.3 % — ABNORMAL LOW (ref 36.0–46.0)
HEMOGLOBIN: 7.8 g/dL — AB (ref 12.0–15.0)
Lymphocytes Relative: 20 % (ref 12–46)
Lymphs Abs: 2.3 10*3/uL (ref 0.7–4.0)
MCH: 29.4 pg (ref 26.0–34.0)
MCHC: 32.1 g/dL (ref 30.0–36.0)
MCV: 91.7 fL (ref 78.0–100.0)
MONOS PCT: 10 % (ref 3–12)
Monocytes Absolute: 1.1 10*3/uL — ABNORMAL HIGH (ref 0.1–1.0)
NEUTROS ABS: 8 10*3/uL — AB (ref 1.7–7.7)
Neutrophils Relative %: 68 % (ref 43–77)
PLATELETS: 504 10*3/uL — AB (ref 150–400)
RBC: 2.65 MIL/uL — ABNORMAL LOW (ref 3.87–5.11)
RDW: 14.2 % (ref 11.5–15.5)
WBC: 11.7 10*3/uL — ABNORMAL HIGH (ref 4.0–10.5)

## 2015-04-09 MED ORDER — FERROUS SULFATE 325 (65 FE) MG PO TABS: 325.0000 mg | ORAL_TABLET | Freq: Two times a day (BID) | ORAL | Status: DC

## 2015-04-09 MED ORDER — BACITRACIN ZINC 500 UNIT/GM EX OINT: TOPICAL_OINTMENT | Freq: Two times a day (BID) | CUTANEOUS | Status: DC

## 2015-04-09 MED ORDER — METHOCARBAMOL 500 MG PO TABS: 500.0000 mg | ORAL_TABLET | Freq: Three times a day (TID) | ORAL | Status: DC

## 2015-04-09 MED ORDER — ALUM & MAG HYDROXIDE-SIMETH 200-200-20 MG/5ML PO SUSP: 30.0000 mL | ORAL | Status: DC | PRN

## 2015-04-09 MED ORDER — ADULT MULTIVITAMIN W/MINERALS CH: 1.0000 | ORAL_TABLET | Freq: Every day | ORAL | Status: DC

## 2015-04-09 MED ORDER — AMOXICILLIN-POT CLAVULANATE 875-125 MG PO TABS: 1.0000 | ORAL_TABLET | Freq: Two times a day (BID) | ORAL | Status: DC

## 2015-04-09 MED ORDER — METHOCARBAMOL 500 MG PO TABS
500.0000 mg | ORAL_TABLET | Freq: Three times a day (TID) | ORAL | Status: DC
  Administered 2015-04-09 – 2015-04-10 (×4): 500 mg via ORAL
  Filled 2015-04-09 (×4): qty 1

## 2015-04-09 NOTE — Discharge Summary (Signed)
Physician Discharge Summary  Patient ID: Michele Palmer MRN: [id number] DOB/AGE: 1936-11-25 79 y.o.  Admit date: 04/06/2015 Discharge date: 04/10/2015  Admission Diagnoses: Subdiaphragmatic abscess left side  Discharge Diagnoses:  Active Problems:   Subdiaphragmatic abscess   Discharged Condition: good  Hospital Course: Patient readmitted after complex colovesicle fistula repair with loop colostomy takedown and sigmoid resection and loop ileostomy creation.  Developed fever and left sided pain, CT demonstrated left subdiaphragmatic abscess which was percutaneous drained under guidance.  Fluid has failed to grow any bacteria, but the patient had received IV antibiotic prior to procedure and specimen collection.  Has done well in the hospital, no fever, and the WBC has trended downward near normal.  She is on regular diet well, but some indigestion.  Staples have been removed.  Appointment with Urologist rescheduled from today for cysto and removal of catheter.  Patient was seen by one of my partners on 5/14 and she was ready for discharge with her left upper quadrant drain.  Consults: urology  Significant Diagnostic Studies: labs: cbc/bmet, microbiology: subdiaphragmatic abscess collection, radiology: CT scan: abdomen and pelvis and percutaneous IR drainage of abscess  Treatments: IV hydration, antibiotics: Zosyn and procedures: percutaneous drainage catheter placement  Discharge Exam: Blood pressure 150/67, pulse 89, temperature 98.7 F (37.1 C), temperature source Oral, resp. rate 18, height 5\' 3"  (1.6 m), weight 98.5 kg (217 lb 2.5 oz), SpO2 93 %. General appearance: alert, cooperative, appears stated age and no distress GI: soft, non-tender; bowel sounds normal; no masses,  no organomegaly  Disposition: 06-Home-Health Care Svc with PT and nursing care     Medication List    STOP taking these medications        ondansetron 4 MG/2ML Soln injection  Commonly known as:  ZOFRAN       TAKE these medications        acetaminophen 500 MG tablet  Commonly known as:  TYLENOL  Take 500-1,000 mg by mouth every 6 (six) hours as needed (pain).     ALPRAZolam 0.25 MG tablet  Commonly known as:  XANAX  Take 0.125 mg by mouth at bedtime as needed for sleep.     alum & mag hydroxide-simeth 200-200-20 MG/5ML suspension  Commonly known as:  MAALOX/MYLANTA  Take 30 mLs by mouth every 4 (four) hours as needed for indigestion or heartburn.     amoxicillin-clavulanate 875-125 MG per tablet  Commonly known as:  AUGMENTIN  Take 1 tablet by mouth 2 (two) times daily.     anastrozole 1 MG tablet  Commonly known as:  ARIMIDEX  Take 1 tablet (1 mg total) by mouth daily.     bacitracin ointment  Apply topically 2 (two) times daily.     citalopram 20 MG tablet  Commonly known as:  CELEXA  Take 10 mg by mouth at bedtime.     feeding supplement (PRO-STAT SUGAR FREE 64) Liqd  Take 30 mLs by mouth 2 (two) times daily.     feeding supplement (RESOURCE BREEZE) Liqd  Take 1 Container by mouth 2 (two) times daily between meals.     ferrous sulfate 325 (65 FE) MG tablet  Take 1 tablet (325 mg total) by mouth 2 (two) times daily with a meal.     losartan 100 MG tablet  Commonly known as:  COZAAR  Take 100 mg by mouth daily.     methocarbamol 500 MG tablet  Commonly known as:  ROBAXIN  Take 1 tablet (500 mg total) by mouth  every 8 (eight) hours.     multivitamin with minerals Tabs tablet  Take 1 tablet by mouth daily.     oxyCODONE-acetaminophen 5-325 MG per tablet  Commonly known as:  PERCOCET/ROXICET  Take 1-2 tablets by mouth every 4 (four) hours as needed for moderate pain.     ranitidine 150 MG tablet  Commonly known as:  ZANTAC  Take 150 mg by mouth 2 (two) times daily as needed for heartburn.       Follow-up Information    Follow up with Judeth Horn, MD. Schedule an appointment as soon as possible for a visit in 2 weeks.   Specialty:  General Surgery   Contact  information:   [address] [place] [address] [phone]       Follow up with Radiology [facility].   Why:  Drain Scientist, water quality information:   They will call you for appointment on 5/24 with follow up CT scan      Signed: Judeth Horn 04/09/2015, 5:47 PM

## 2015-04-09 NOTE — Progress Notes (Signed)
GS Progress Note Subjective: Patient complaining of left sided back pain.  Like spasm.  Objective: Vital signs in last 24 hours: Temp:  [98.6 F (37 C)-98.9 F (37.2 C)] 98.9 F (37.2 C) (05/13 0520) Pulse Rate:  [84-85] 84 (05/13 0520) Resp:  [16-17] 17 (05/13 0520) BP: (136-162)/(70-77) 162/77 mmHg (05/13 0520) SpO2:  [91 %-95 %] 93 % (05/13 0520) Last BM Date: 04/08/15  Intake/Output from previous day: 05/12 0701 - 05/13 0700 In: 1010 [P.O.:350; I.V.:574; IV Piggyback:81] Out: 2202 [Urine:2012; Drains:90; Stool:100] Intake/Output this shift:    Lungs: Clear  Abd: Soft, excellent bowel sounds.  Ileostomy drainage is thick and constant.  Midline would a bit red in the center with some serous drainage.  Staples in place.  Extremities: No changes  Neuro: Intact  Lab Results: CBC   Recent Labs  04/08/15 0344 04/09/15 0259  WBC 12.1* 11.7*  HGB 7.0* 7.8*  HCT 21.2* 24.3*  PLT 461* 504*   BMET  Recent Labs  04/07/15 0738 04/08/15 0344  NA 136 135  K 3.4* 3.9  CL 96* 101  CO2 30 29  GLUCOSE 87 129*  BUN 7 8  CREATININE 0.51 0.57  CALCIUM 8.1* 7.8*   PT/INR No results for input(s): LABPROT, INR in the last 72 hours. ABG No results for input(s): PHART, HCO3 in the last 72 hours.  Invalid input(s): PCO2, PO2  Studies/Results: Ct Image Guided Drainage By Percutaneous Catheter  04/07/2015   CLINICAL DATA:  Post colostomy takedown, partial colectomy, diverting loop ileostomy. Fever, abdominal pain. CT demonstrates new large subphrenic collection.  EXAM: CT GUIDED DRAINAGE OF LEFT SUBPHRENIC ABSCESS  ANESTHESIA/SEDATION: Intravenous Fentanyl and Versed were administered as conscious sedation during continuous cardiorespiratory monitoring by the radiology RN, with a total moderate sedation time of 14 minutes.  PROCEDURE: The procedure, risks, benefits, and alternatives were explained to the patient. Questions regarding the procedure were encouraged and answered.  The patient understands and consents to the procedure.  Patient placed in right lateral decubitus position. Limited axial scans through the upper abdomen obtained. The collection was localized, an appropriate skin entry site determined and marked.  The operative field was prepped with Betadinein a sterile fashion, and a sterile drape was applied covering the operative field. A sterile gown and sterile gloves were used for the procedure. Local anesthesia was provided with 1% Lidocaine.  Under CT fluoroscopic guidance, an 18 gauge trocar needle was advanced into the collection using a low posterior approach to avoid the pleura. Once needle was positioned, amber yellow fluid could be aspirated. An Amplatz guidewire advanced easily within the collection, its position confirmed on CT. Tract dilated to facilitate placement of a 12 French pigtail catheter. Approximately 60 mL of amber fluid were aspirated, sent for Gram stain, culture and sensitivity. The culture was secured externally with 0 Prolene suture and StatLock, and placed to gravity drain bag. The patient tolerated the procedure well.  COMPLICATIONS: None immediate  FINDINGS: Limits CT gain confirms the left subphrenic collection. Under CT fluoroscopic guidance, a 12 French drain catheter was placed.  IMPRESSION: 1. Technically successful CT-guided placement of 12 French left subphrenic abscess drain catheter.   Electronically Signed   By: Lucrezia Europe M.D.   On: 04/07/2015 16:07    Anti-infectives: Anti-infectives    Start     Dose/Rate Route Frequency Ordered Stop   04/07/15 0945  metroNIDAZOLE (FLAGYL) IVPB 500 mg  Status:  Discontinued     500 mg 100 mL/hr over 60 Minutes  Intravenous Every 8 hours 04/07/15 0941 04/07/15 1648   04/07/15 0800  piperacillin-tazobactam (ZOSYN) IVPB 3.375 g     3.375 g 12.5 mL/hr over 240 Minutes Intravenous Every 8 hours 04/07/15 0710     04/07/15 0215  piperacillin-tazobactam (ZOSYN) IVPB 3.375 g     3.375 g 100  mL/hr over 30 Minutes Intravenous  Once 04/07/15 0202 04/07/15 0315      Assessment/Plan: s/p  Continue ABX therapy due to Post-op infection Continue foley due to bladder repair Plan for discharge tomorrow PT and continue antibiotics although cultures are negative.  She did receive antibiotics before the drainage procedure.  Robaxin ordered for right posterior back spasms  LOS: 1 day    [name]. [name], MD, FACS [phone] [facility] Surgery 04/09/2015

## 2015-04-09 NOTE — Evaluation (Signed)
Physical Therapy Evaluation Patient Details Name: MARLIES LIGMAN MRN: [id number] DOB: [birth year] Today's Date: 04/09/2015   History of Present Illness  Michele Palmer has presented today for surgery, with the diagnosis of diverticular perforation with abscess and fistula,  S/P COLOSTOMY TAKEDOWN, PARTIAL COLECTOMY WITH ANASTOMOSIS, BLADDER REPAIR, DIVERTING LOOP ILEOSTOMY  Clinical Impression  Patient seen for evaluation. Patient will benefit from continued skilled PT to address deficits as indicated below. Overall, patient mobilizing well, no physical assist for bed mobility and tolerated OOB to chair well. Anticipate patient will continue to progress well with use of RW. At this time, recommend return home and resume HHPT services. Patient in agreement. Will see acutely to progress mobility and maximize independence.    Follow Up Recommendations Home health PT;Supervision for mobility/OOB    Equipment Recommendations  None recommended by PT    Recommendations for Other Services       Precautions / Restrictions Precautions Precautions: Fall Restrictions Weight Bearing Restrictions: No      Mobility  Bed Mobility Overal bed mobility: Needs Assistance Bed Mobility: Rolling;Sidelying to Sit Rolling: Supervision Sidelying to sit: Supervision       General bed mobility comments: No physical assist required  Transfers Overall transfer level: Needs assistance Equipment used: Standard walker Transfers: Sit to/from Stand Sit to Stand: Min guard         General transfer comment: VCs for hand placement  Ambulation/Gait Ambulation/Gait assistance: Min guard Ambulation Distance (Feet): 6 Feet Assistive device: 1 person hand held assist Gait Pattern/deviations: Decreased step length - right;Decreased step length - left;Step-through pattern;Decreased stride length Gait velocity: steady with HHA, anticipate will mobilize well with RW      Stairs            Wheelchair  Mobility    Modified Rankin (Stroke Patients Only)       Balance   Sitting-balance support: Feet supported Sitting balance-Leahy Scale: Fair     Standing balance support: Bilateral upper extremity supported Standing balance-Leahy Scale: Fair                               Pertinent Vitals/Pain Pain Assessment: 0-10 Pain Score: 1  Pain Location: drain site Pain Descriptors / Indicators: Discomfort Pain Intervention(s): Monitored during session;Repositioned;Premedicated before session    Home Living Family/patient expects to be discharged to:: Private residence Living Arrangements: Spouse/significant other Available Help at Discharge: Family;Available 24 hours/day Type of Home: House Home Access: Stairs to enter Entrance Stairs-Rails: None Entrance Stairs-Number of Steps: 2 Home Layout: One level Home Equipment: Walker - 2 wheels;Cane - single point;Bedside commode      Prior Function Level of Independence: Independent               Hand Dominance   Dominant Hand: Right    Extremity/Trunk Assessment               Lower Extremity Assessment: Generalized weakness         Communication   Communication: No difficulties  Cognition Arousal/Alertness: Awake/alert Behavior During Therapy: WFL for tasks assessed/performed Overall Cognitive Status: Within Functional Limits for tasks assessed                      General Comments      Exercises        Assessment/Plan    PT Assessment Patient needs continued PT services  PT Diagnosis Difficulty walking;Generalized weakness  PT Problem List Decreased strength;Decreased activity tolerance;Decreased balance;Decreased mobility;Decreased knowledge of use of DME;Cardiopulmonary status limiting activity;Pain  PT Treatment Interventions Gait training;Functional mobility training;Therapeutic activities;Balance training;Patient/family education;Stair training;DME instruction   PT Goals  (Current goals can be found in the Care Plan section) Acute Rehab PT Goals Patient Stated Goal: to go home PT Goal Formulation: With patient Time For Goal Achievement: 04/10/15 Potential to Achieve Goals: Good    Frequency Min 3X/week   Barriers to discharge        Co-evaluation               End of Session   Activity Tolerance: Patient limited by pain Patient left: in chair;with call bell/phone within reach;with family/visitor present Nurse Communication: Mobility status;Patient requests pain meds         Time: 3790-2409 PT Time Calculation (min) (ACUTE ONLY): 17 min   Charges:   PT Evaluation $Initial PT Evaluation Tier I: 1 Procedure     PT G CodesDuncan Dull 2015-04-13, 10:31 AM Alben Deeds, PT DPT  [phone]

## 2015-04-09 NOTE — Progress Notes (Signed)
Referring Physician(s): CCS  Subjective: Patient denies abdominal pain, but admits to soreness at drain site. She is tolerating diet without N/V  Allergies: Aspirin; Ciprofloxacin; Codeine; and Sulfur  Medications: Prior to Admission medications   Medication Sig Start Date End Date Taking? Authorizing Provider  acetaminophen (TYLENOL) 500 MG tablet Take 500-1,000 mg by mouth every 6 (six) hours as needed (pain).   Yes Historical Provider, MD  ALPRAZolam (XANAX) 0.25 MG tablet Take 0.125 mg by mouth at bedtime as needed for sleep.    Yes Historical Provider, MD  anastrozole (ARIMIDEX) 1 MG tablet Take 1 tablet (1 mg total) by mouth daily. 02/09/14  Yes Consuela Mimes, MD  citalopram (CELEXA) 20 MG tablet Take 10 mg by mouth at bedtime.  01/03/15  Yes Historical Provider, MD  losartan (COZAAR) 100 MG tablet Take 100 mg by mouth daily.   Yes Historical Provider, MD  oxyCODONE-acetaminophen (PERCOCET/ROXICET) 5-325 MG per tablet Take 1-2 tablets by mouth every 4 (four) hours as needed for moderate pain. 04/02/15  Yes Judeth Horn, MD  ranitidine (ZANTAC) 150 MG tablet Take 150 mg by mouth 2 (two) times daily as needed for heartburn.  01/05/15  Yes Historical Provider, MD  Amino Acids-Protein Hydrolys (FEEDING SUPPLEMENT, PRO-STAT SUGAR FREE 64,) LIQD Take 30 mLs by mouth 2 (two) times daily. Patient not taking: Reported on 03/19/2015 02/05/15   Erby Pian, NP  feeding supplement, RESOURCE BREEZE, (RESOURCE BREEZE) LIQD Take 1 Container by mouth 2 (two) times daily between meals. Patient not taking: Reported on 04/06/2015 02/05/15   Emina Riebock, NP  ondansetron (ZOFRAN) 4 MG/2ML SOLN injection Inject 2 mLs (4 mg total) into the vein every 4 (four) hours as needed for nausea or vomiting. Patient not taking: Reported on 04/06/2015 04/02/15   Judeth Horn, MD   Vital Signs: BP 162/77 mmHg  Pulse 84  Temp(Src) 98.9 F (37.2 C) (Oral)  Resp 17  Ht 5\' 3"  (1.6 m)  Wt 217 lb 2.5 oz (98.5 kg)  BMI 38.48  kg/m2  SpO2 93%  Physical Exam General: A&Ox3, NAD Abd: Soft, NT, Left flank drain dressing C/D/I, no signs of bleeding/hematoma, 90 cc/24 hrs (300cc), 15 cc serous purulent stranding output in bag  Imaging: Ct Abdomen Pelvis W Contrast  04/07/2015   CLINICAL DATA:  Abdominal surgery with open wound last week. Increase drainage from the wound today. Fever developing this afternoon. Increased pain.  EXAM: CT ABDOMEN AND PELVIS WITH CONTRAST  TECHNIQUE: Multidetector CT imaging of the abdomen and pelvis was performed using the standard protocol following bolus administration of intravenous contrast.  CONTRAST:  154mL OMNIPAQUE IOHEXOL 300 MG/ML  SOLN  COMPARISON:  03/09/2015  FINDINGS: Small bilateral pleural effusions and basilar atelectasis, greater on the left. Coronary artery calcification.  Loculated left subdiaphragmatic fluid collection with thick enhancing wall and small gas bubbles. This is suspicious for other subdiaphragmatic abscess. The fluid collection measures about 6.8 x 9.9 cm maximal diameter. Spleen is displaced anteriorly but does not appear involved. There is skin defect in the left lower quadrant anterior subcutaneous fat with infiltration and skin clips along the midline of the lower abdominal and pelvic wall consistent with postoperative change. No subcutaneous fluid collection is demonstrated. There is apparent resection of the sigmoid colon with anastomosis at the rectosigmoid region and diverting right lower quadrant ileostomy. Contrast material flows through to the ileostomy without evidence of obstruction. No small or large bowel distention. Colon is partially stool-filled and mostly decompressed.  Mild diffuse fatty infiltration  of the liver. Gallbladder, pancreas, adrenal glands, inferior vena cava, and retroperitoneal lymph nodes are unremarkable. Calcification of abdominal aorta without aneurysm. Nephrograms are symmetrical. Mild hydronephrosis of the left kidney without  obvious obstructing lesion or stone.  Pelvis: Uterus and ovaries are not enlarged. There is a Foley catheter in the bladder. Gas in the bladder is likely related to Foley catheter. No free or loculated pelvic fluid collections. Iliac chain lymph nodes are not pathologically enlarged and are probably reactive. Diffuse bone demineralization. Degenerative changes in the lumbar spine and hips. No destructive bone lesion.  IMPRESSION: Postoperative changes with sigmoid resection and anastomosis and diverting right lower quadrant ileostomy. Skin clips and incision wounds in anterior abdominal wall. Left subdiaphragmatic abscess. Small bilateral pleural effusions and basilar atelectasis, greater on the left.   Electronically Signed   By: Lucienne Capers M.D.   On: 04/07/2015 01:22   Ct Image Guided Drainage By Percutaneous Catheter  04/07/2015   CLINICAL DATA:  Post colostomy takedown, partial colectomy, diverting loop ileostomy. Fever, abdominal pain. CT demonstrates new large subphrenic collection.  EXAM: CT GUIDED DRAINAGE OF LEFT SUBPHRENIC ABSCESS  ANESTHESIA/SEDATION: Intravenous Fentanyl and Versed were administered as conscious sedation during continuous cardiorespiratory monitoring by the radiology RN, with a total moderate sedation time of 14 minutes.  PROCEDURE: The procedure, risks, benefits, and alternatives were explained to the patient. Questions regarding the procedure were encouraged and answered. The patient understands and consents to the procedure.  Patient placed in right lateral decubitus position. Limited axial scans through the upper abdomen obtained. The collection was localized, an appropriate skin entry site determined and marked.  The operative field was prepped with Betadinein a sterile fashion, and a sterile drape was applied covering the operative field. A sterile gown and sterile gloves were used for the procedure. Local anesthesia was provided with 1% Lidocaine.  Under CT fluoroscopic  guidance, an 18 gauge trocar needle was advanced into the collection using a low posterior approach to avoid the pleura. Once needle was positioned, amber yellow fluid could be aspirated. An Amplatz guidewire advanced easily within the collection, its position confirmed on CT. Tract dilated to facilitate placement of a 12 French pigtail catheter. Approximately 60 mL of amber fluid were aspirated, sent for Gram stain, culture and sensitivity. The culture was secured externally with 0 Prolene suture and StatLock, and placed to gravity drain bag. The patient tolerated the procedure well.  COMPLICATIONS: None immediate  FINDINGS: Limits CT gain confirms the left subphrenic collection. Under CT fluoroscopic guidance, a 12 French drain catheter was placed.  IMPRESSION: 1. Technically successful CT-guided placement of 12 French left subphrenic abscess drain catheter.   Electronically Signed   By: Lucrezia Europe M.D.   On: 04/07/2015 16:07    Labs:  CBC:  Recent Labs  04/06/15 1849 04/07/15 0738 04/08/15 0344 04/09/15 0259  WBC 21.1* 14.6* 12.1* 11.7*  HGB 9.0* 7.9* 7.0* 7.8*  HCT 27.9* 24.6* 21.2* 24.3*  PLT 487* 470* 461* 504*    COAGS:  Recent Labs  01/16/15 0905 02/02/15 0008  INR 1.13 1.27    BMP:  Recent Labs  03/31/15 0455 04/06/15 1849 04/07/15 0738 04/08/15 0344  NA 137 133* 136 135  K 3.5 3.8 3.4* 3.9  CL 103 95* 96* 101  CO2 24 28 30 29   GLUCOSE 111* 149* 87 129*  BUN 9 8 7 8   CALCIUM 8.6* 8.1* 8.1* 7.8*  CREATININE 0.45 0.55 0.51 0.57  GFRNONAA >60 >60 >60 >60  GFRAA >60 >60 >60 >60    LIVER FUNCTION TESTS:  Recent Labs  01/25/15 0440 01/31/15 0517 03/24/15 0842 04/06/15 1849  BILITOT 0.1* 0.5 0.5 0.7  AST 23 17 18 19   ALT 11 12 12 14   ALKPHOS 98 100 96 175*  PROT 5.8* 5.7* 7.2 6.5  ALBUMIN 1.6* 1.6* 3.0* 1.8*   Assessment and Plan: Colostomy takedown, partial colectomy, bladder repair, diverting loop ileostomy and cystoscopy with stent placement  03/26/15 Post operative left subdiaphragmatic fluid collection S/p perc drain placed in CT 5/11 Good serous with purulent stranding output 90cc/24 hrs, wbc trending down 11 (12), Cx pending-no growth, afebrile- Discussed with Dr. Barbie Banner, patient will need [facility] drain clinic F/U with CT imaging abdomen/pelvis with contrast on 04/20/15, our office will call her to setup time.  Plans per CCS   Signed: Hedy Jacob 04/09/2015, 10:01 AM   I spent a total of 15 Minutes in face to face in clinical consultation/evaluation, greater than 50% of which was counseling/coordinating care for left subdiaphragmatic abscess.

## 2015-04-10 LAB — CULTURE, ROUTINE-ABSCESS: Culture: NO GROWTH

## 2015-04-10 NOTE — Progress Notes (Signed)
Physical Therapy Treatment Patient Details Name: Michele Palmer MRN: [id number] DOB: 04/19/1936 Today's Date: 04/10/2015    History of Present Illness Michele Palmer has presented today for surgery, with the diagnosis of diverticular perforation with abscess and fistula,  S/P COLOSTOMY TAKEDOWN, PARTIAL COLECTOMY WITH ANASTOMOSIS, BLADDER REPAIR, DIVERTING LOOP ILEOSTOMY    PT Comments    Progressing towards PT goals, ambulating up to 90 feet today with min guard assist using a RW for support. Completed stair training with daughter-in-law actively participating, requiring min assist to perform safely. SpO2 89-94% on room air, HR elevates to 120s while ambulating. She will have adequate support at d/c from family. She has no further questions or concerns for PT at this time. Patient will continue to benefit from skilled physical therapy services at home with HHPT to further improve independence with functional mobility.   Follow Up Recommendations  Home health PT;Supervision for mobility/OOB     Equipment Recommendations  None recommended by PT    Recommendations for Other Services       Precautions / Restrictions Precautions Precautions: Fall Restrictions Weight Bearing Restrictions: No    Mobility  Bed Mobility Overal bed mobility: Needs Assistance Bed Mobility: Rolling;Sidelying to Sit Rolling: Supervision Sidelying to sit: Supervision;HOB elevated       General bed mobility comments: No physical assist required. HOB elevated. requires extra time. Cues for sequencing for comfort.  Transfers Overall transfer level: Needs assistance Equipment used: Rolling walker (2 wheeled) Transfers: Sit to/from Stand Sit to Stand: Min guard         General transfer comment: Min guard for safety. VC for hand placement and positoning at edge of bed prior to standing. Performed from bed and recliner, x2.  Ambulation/Gait Ambulation/Gait assistance: Min guard Ambulation Distance  (Feet): 90 Feet Assistive device: Rolling walker (2 wheeled) Gait Pattern/deviations: Step-through pattern;Decreased stride length;Trunk flexed;Decreased weight shift to left Gait velocity: decreased, guarded   General Gait Details: Educated on safe DME use with a rolling walker. Cues to correct slight right lateral list. VC for upright posture and pursed lip breathing. SpO2 89-94% on room air. Min guard for safety. Very fatigued at end of distance.   Stairs Stairs: Yes Stairs assistance: +2 physical assistance Stair Management: No rails;Step to pattern;Forwards Number of Stairs: 2 General stair comments: Min assist +2 hand held on either side. pt reports she performs this way at home with assist from family. Performed this task with moderate difficulty but adequately enough for family support.  Wheelchair Mobility    Modified Rankin (Stroke Patients Only)       Balance                                    Cognition Arousal/Alertness: Awake/alert Behavior During Therapy: WFL for tasks assessed/performed Overall Cognitive Status: Within Functional Limits for tasks assessed                      Exercises General Exercises - Lower Extremity Ankle Circles/Pumps: AROM;Both;10 reps;Seated    General Comments General comments (skin integrity, edema, etc.): HR elevated to 120s ambulating, improves upon sitting.      Pertinent Vitals/Pain Pain Assessment: 0-10 Pain Score: 7  Pain Location: back- drain site Pain Descriptors / Indicators: Discomfort Pain Intervention(s): Monitored during session;Repositioned    Home Living  Prior Function            PT Goals (current goals can now be found in the care plan section) Acute Rehab PT Goals Patient Stated Goal: to go home PT Goal Formulation: With patient Time For Goal Achievement: 04/19/15 Potential to Achieve Goals: Good Progress towards PT goals: Progressing toward  goals    Frequency  Min 3X/week    PT Plan Current plan remains appropriate    Co-evaluation             End of Session Equipment Utilized During Treatment: Gait belt Activity Tolerance: Patient tolerated treatment well Patient left: in chair;with call bell/phone within reach;with family/visitor present     Time: 9390-3009 PT Time Calculation (min) (ACUTE ONLY): 33 min  Charges:  $Gait Training: 8-22 mins $Therapeutic Activity: 8-22 mins                    G Codes:      Ellouise Newer 2015/04/19, 11:31 AM Elayne Snare, [place]

## 2015-04-10 NOTE — Progress Notes (Signed)
  Subjective: No complaints PT getting ready to work with  Objective: Vital signs in last 24 hours: Temp:  [98.2 F (36.8 C)-99 F (37.2 C)] 99 F (37.2 C) (05/14 0508) Pulse Rate:  [79-89] 79 (05/14 0508) Resp:  [16-20] 20 (05/14 0508) BP: (146-161)/(67-76) 161/76 mmHg (05/14 0508) SpO2:  [91 %-93 %] 91 % (05/14 0508) Last BM Date: 04/09/15  Intake/Output from previous day: 05/13 0701 - 05/14 0700 In: 727.2 [P.O.:340; I.V.:227.2; IV Piggyback:150] Out: 4255 [Urine:4200; Drains:55] Intake/Output this shift:    Abdomen soft, ostomy ok Drain serous  Lab Results:   Recent Labs  04/08/15 0344 04/09/15 0259  WBC 12.1* 11.7*  HGB 7.0* 7.8*  HCT 21.2* 24.3*  PLT 461* 504*   BMET  Recent Labs  04/08/15 0344  NA 135  K 3.9  CL 101  CO2 29  GLUCOSE 129*  BUN 8  CREATININE 0.57  CALCIUM 7.8*   PT/INR No results for input(s): LABPROT, INR in the last 72 hours. ABG No results for input(s): PHART, HCO3 in the last 72 hours.  Invalid input(s): PCO2, PO2  Studies/Results: No results found.  Anti-infectives: Anti-infectives    Start     Dose/Rate Route Frequency Ordered Stop   04/09/15 0000  amoxicillin-clavulanate (AUGMENTIN) 875-125 MG per tablet     1 tablet Oral 2 times daily 04/09/15 1744     04/07/15 0945  metroNIDAZOLE (FLAGYL) IVPB 500 mg  Status:  Discontinued     500 mg 100 mL/hr over 60 Minutes Intravenous Every 8 hours 04/07/15 0941 04/07/15 1648   04/07/15 0800  piperacillin-tazobactam (ZOSYN) IVPB 3.375 g     3.375 g 12.5 mL/hr over 240 Minutes Intravenous Every 8 hours 04/07/15 0710     04/07/15 0215  piperacillin-tazobactam (ZOSYN) IVPB 3.375 g     3.375 g 100 mL/hr over 30 Minutes Intravenous  Once 04/07/15 0202 04/07/15 0315      Assessment/Plan: s/p * No surgery found *  Discharge home with drain  LOS: 2 days    Minnie Legros A 04/10/2015

## 2015-04-13 LAB — CULTURE, BLOOD (ROUTINE X 2)
CULTURE: NO GROWTH
Culture: NO GROWTH

## 2015-04-15 ENCOUNTER — Other Ambulatory Visit (HOSPITAL_COMMUNITY): Payer: Self-pay | Admitting: Interventional Radiology

## 2015-04-15 DIAGNOSIS — N739 Female pelvic inflammatory disease, unspecified: Secondary | ICD-10-CM

## 2015-04-20 ENCOUNTER — Ambulatory Visit (HOSPITAL_COMMUNITY)
Admission: RE | Admit: 2015-04-20 | Discharge: 2015-04-20 | Disposition: A | Discharge status: Home / Self Care | Payer: Medicare Other | Source: Ambulatory Visit | Attending: Interventional Radiology | Admitting: Interventional Radiology

## 2015-04-20 DIAGNOSIS — I2510 Atherosclerotic heart disease of native coronary artery without angina pectoris: Secondary | ICD-10-CM | POA: Diagnosis not present

## 2015-04-20 DIAGNOSIS — Z4889 Encounter for other specified surgical aftercare: Secondary | ICD-10-CM | POA: Insufficient documentation

## 2015-04-20 DIAGNOSIS — Z932 Ileostomy status: Secondary | ICD-10-CM | POA: Diagnosis not present

## 2015-04-20 DIAGNOSIS — I7090 Unspecified atherosclerosis: Secondary | ICD-10-CM | POA: Diagnosis not present

## 2015-04-20 DIAGNOSIS — T8189XA Other complications of procedures, not elsewhere classified, initial encounter: Secondary | ICD-10-CM

## 2015-04-20 DIAGNOSIS — J90 Pleural effusion, not elsewhere classified: Secondary | ICD-10-CM | POA: Diagnosis not present

## 2015-04-20 DIAGNOSIS — K651 Peritoneal abscess: Secondary | ICD-10-CM | POA: Diagnosis not present

## 2015-04-20 DIAGNOSIS — M129 Arthropathy, unspecified: Secondary | ICD-10-CM | POA: Insufficient documentation

## 2015-04-20 DIAGNOSIS — N739 Female pelvic inflammatory disease, unspecified: Secondary | ICD-10-CM

## 2015-04-20 MED ORDER — LIDOCAINE HCL (PF) 1 % IJ SOLN
INTRAMUSCULAR | Status: DC | PRN
  Administered 2015-04-20: 10 mL

## 2015-04-20 MED ORDER — LIDOCAINE HCL (CARDIAC) 20 MG/ML IV SOLN
INTRAVENOUS | Status: AC
  Filled 2015-04-20: qty 5

## 2015-04-20 MED ORDER — IOHEXOL 300 MG/ML  SOLN
100.0000 mL | Freq: Once | INTRAMUSCULAR | Status: AC | PRN
  Administered 2015-04-20: 100 mL via INTRAVENOUS

## 2015-04-28 DIAGNOSIS — I739 Peripheral vascular disease, unspecified: Secondary | ICD-10-CM

## 2015-04-28 HISTORY — DX: Peripheral vascular disease, unspecified: I73.9

## 2015-05-24 ENCOUNTER — Other Ambulatory Visit: Payer: Self-pay | Admitting: Oncology

## 2015-05-24 NOTE — Telephone Encounter (Signed)
Last OV 08/25/14.  No future appt - note given to pharmacist.

## 2015-05-25 NOTE — Op Note (Signed)
Author: Kathie Rhodes, MD Service: Urology Author Type: Physician      PATIENT:  Michele Palmer  PRE-OPERATIVE DIAGNOSIS: Colovesical fistula  POST-OPERATIVE DIAGNOSIS: Same  PROCEDURE:  1.Cystoscopy with left retrograde pyelogram including interpretation. 2.  Left ureteral catheterization. 3.  Repair of colovesical fistula  Co-surgeon:  Claybon Jabs  Co-surgeon: Doreen Salvage  INDICATION: Michele Palmer is a 79 year old female who developed pneumaturia and fecaluria.  She was found to have a mass in the pelvis primarily on the left-hand side that was drained percutaneously.  Cystoscopically she was found to have what appeared to be a Michele Palmer on the posterior wall bladder on the right hand side consistent with her known fistula.She is brought to the operating room today for repair of her fistula.  ANESTHESIA:  General  EBL:  Minimal  DRAINS: 85 French Foley catheter in the bladder, 6 French open-ended ureteral stent in the left ureter and a Blake drain in the pelvis.  LOCAL MEDICATIONS USED:  None  SPECIMEN:  Portion of the bladder including the fistulous tract  Description of procedure: After informed consent the patient was taken to the operating room and placed on the table in a supine position. General anesthesia was then administered. Once fully anesthetized the patient was moved to the dorsal lithotomy position and the genitalia were sterilely prepped and draped in standard fashion. An official timeout was then performed.  The 39 French cystoscope was passed into the bladder under direct vision and the ureteral orifices were noted to be in normal position and configuration.  I fully inspected the bladder and found an erythematous area on the posterior wall to the right of midline consistent with her known fistula.  It appeared to have a central dimple consistent with the actual fistula itself.  This was well away from the trigone.  A 6 French open-ended ureteral catheter was then  passed through the cystoscope and into the left ureteral orifice.  I then injected full-strength contrast in order to perform a left retrograde pyelogram in the standard fashion.  Using real-time fluoroscopy the full-strength contrast was injected up the left ureter which was noted to be entirely normal in its appearance.  There was no filling defects or mass effect.  It appeared to follow a normal course to the kidney with a normal intrarenal collecting system.  A 6 French open-ended ureteral catheter was then passed up the left ureter without difficulty into the area of the renal pelvis with fluoroscopic guidance.  I then drained the bladder through the cystoscope and removed the cystoscope leaving the open-ended catheter in place.  A 20 French Foley catheter was then inserted into the bladder and I then burrowed the 6 [place] open-ended catheter through the wall of the Foley catheter so that it could drain into the catheter bag and secured to the catheter with a silk suture.  I then assisted Dr. Hulen Skains with exploratory laparotomy, lysis of adhesions and exposure of the bladder.  This portion of the operation will be dictated separately by him.  He did assist with my portion of the operation.  I first filled the bladder with 200 cc of sterile water with methylene blue and noted no definite leak into the peritoneal cavity.  The abscess was primarily located to the right of midline.  I placed a sutures in the anterior wall of the bladder and then made an incision in the bladder.  This was carried inferior and superiorly in order to expose the fistulous  tract.  I was able to identify the location and placed a right angle through the opening of the fistula and it passed through the wall of the bladder without resistance.  I used a silk suture in a figure-of-eight fashion to oversew the fistulous tract and used this as a traction suture and then used the Bovie to perform excision of the fistulous tract by excising  this in an elliptical fashion.  This was sent to pathology.  I then proceeded with closure of the bladder.  Bladder closure was performed using 2-0 chromic suture in a 2 layer fashion with the first layer encompassing the muscularis and mucosa.  The second layer encompassed the muscularis and adventitia and was performed in a locking fashion.  I then refilled the bladder to check for leakage and none was noted.  The bladder was therefore drained and the catheter was connected to closed system drainage.  The remainder of the operation was then completed by Dr. Hulen Skains and dictated by him separately.  The Foley catheter will be left indwelling for 2 weeks and she will return for cystogram.  A Blake drain was placed in the pelvis at the end of the operation.  This portion of the operation was well tolerated with no intraoperative complications.   PATIENT DISPOSITION:  PACU - hemodynamically stable.

## 2015-06-08 ENCOUNTER — Other Ambulatory Visit: Payer: Self-pay | Admitting: General Surgery

## 2015-06-08 DIAGNOSIS — Z932 Ileostomy status: Secondary | ICD-10-CM

## 2015-06-09 ENCOUNTER — Ambulatory Visit
Admission: RE | Admit: 2015-06-09 | Discharge: 2015-06-09 | Disposition: A | Discharge status: Home / Self Care | Payer: Medicare Other | Source: Ambulatory Visit | Attending: General Surgery | Admitting: General Surgery

## 2015-06-29 ENCOUNTER — Ambulatory Visit: Payer: Self-pay | Admitting: General Surgery

## 2015-07-09 ENCOUNTER — Encounter (HOSPITAL_COMMUNITY): Payer: Self-pay

## 2015-07-09 ENCOUNTER — Encounter (HOSPITAL_COMMUNITY)
Admission: RE | Admit: 2015-07-09 | Discharge: 2015-07-09 | Disposition: A | Discharge status: Home / Self Care | Payer: Medicare Other | Source: Ambulatory Visit | Attending: General Surgery | Admitting: General Surgery

## 2015-07-09 DIAGNOSIS — J90 Pleural effusion, not elsewhere classified: Secondary | ICD-10-CM

## 2015-07-09 DIAGNOSIS — K435 Parastomal hernia without obstruction or  gangrene: Secondary | ICD-10-CM | POA: Insufficient documentation

## 2015-07-09 DIAGNOSIS — Z01818 Encounter for other preprocedural examination: Secondary | ICD-10-CM | POA: Insufficient documentation

## 2015-07-09 DIAGNOSIS — Z01812 Encounter for preprocedural laboratory examination: Secondary | ICD-10-CM

## 2015-07-09 DIAGNOSIS — I10 Essential (primary) hypertension: Secondary | ICD-10-CM | POA: Insufficient documentation

## 2015-07-09 DIAGNOSIS — Z0183 Encounter for blood typing: Secondary | ICD-10-CM | POA: Insufficient documentation

## 2015-07-09 HISTORY — DX: Peripheral vascular disease, unspecified: I73.9

## 2015-07-09 LAB — COMPREHENSIVE METABOLIC PANEL
ALK PHOS: 99 U/L (ref 38–126)
ALT: 16 U/L (ref 14–54)
ANION GAP: 7 (ref 5–15)
AST: 18 U/L (ref 15–41)
Albumin: 3.3 g/dL — ABNORMAL LOW (ref 3.5–5.0)
BILIRUBIN TOTAL: 0.4 mg/dL (ref 0.3–1.2)
BUN: 20 mg/dL (ref 6–20)
CHLORIDE: 105 mmol/L (ref 101–111)
CO2: 25 mmol/L (ref 22–32)
Calcium: 9.8 mg/dL (ref 8.9–10.3)
Creatinine, Ser: 0.85 mg/dL (ref 0.44–1.00)
GFR calc Af Amer: >60 mL/min (ref >60)
GFR calc non Af Amer: >60 mL/min (ref >60)
Glucose, Bld: 104 mg/dL — ABNORMAL HIGH (ref 65–99)
POTASSIUM: 5.2 mmol/L — AB (ref 3.5–5.1)
SODIUM: 137 mmol/L (ref 135–145)
TOTAL PROTEIN: 7.9 g/dL (ref 6.5–8.1)

## 2015-07-09 LAB — CBC WITH DIFFERENTIAL/PLATELET
BASOS ABS: 0 10*3/uL (ref 0.0–0.1)
BASOS PCT: 0 % (ref 0–1)
EOS ABS: 0.2 10*3/uL (ref 0.0–0.7)
Eosinophils Relative: 2 % (ref 0–5)
HCT: 40.1 % (ref 36.0–46.0)
HEMOGLOBIN: 12.7 g/dL (ref 12.0–15.0)
Lymphocytes Relative: 29 % (ref 12–46)
Lymphs Abs: 2.6 10*3/uL (ref 0.7–4.0)
MCH: 29.7 pg (ref 26.0–34.0)
MCHC: 31.7 g/dL (ref 30.0–36.0)
MCV: 93.9 fL (ref 78.0–100.0)
MONOS PCT: 8 % (ref 3–12)
Monocytes Absolute: 0.7 10*3/uL (ref 0.1–1.0)
NEUTROS ABS: 5.6 10*3/uL (ref 1.7–7.7)
Neutrophils Relative %: 61 % (ref 43–77)
Platelets: 231 10*3/uL (ref 150–400)
RBC: 4.27 MIL/uL (ref 3.87–5.11)
RDW: 16.3 % — AB (ref 11.5–15.5)
WBC: 9.1 10*3/uL (ref 4.0–10.5)

## 2015-07-09 LAB — PROTIME-INR
INR: 1.05 (ref 0.00–1.49)
Prothrombin Time: 13.9 seconds (ref 11.6–15.2)

## 2015-07-09 LAB — GLUCOSE, CAPILLARY: Glucose-Capillary: 90 mg/dL (ref 65–99)

## 2015-07-09 NOTE — Progress Notes (Signed)
xarelto d/c'd 07/07/15 Bowel inst given by office per patient

## 2015-07-09 NOTE — Pre-Procedure Instructions (Addendum)
Michele Palmer  07/09/2015      [facility] - [place], [place] - [address] [address] Phone: [phone] Fax: [phone]    Your procedure is scheduled on Monday, August 15th   Report to [facility] Admitting at 5:30 AM  Call this number if you have problems the morning of surgery:  [phone]   Remember:  Do not eat food or drink liquids after midnight Sunday.   Take these medicines the morning of surgery with A SIP OF WATER zantac,arimadex,    STOP all herbel meds, nsaids (aleve,naproxen,advil,ibuprofen) now including vitamins   Stop xarelto per dr 07/07/15        Do not wear jewelry, make-up or nail polish.  Do not wear lotions, powders, or perfumes.    Do not shave underarms & legs 48 hours prior to surgery.     Do not bring valuables to the hospital.  [facility] is not responsible for any belongings or valuables.  Contacts, dentures or bridgework may not be worn into surgery.  Leave your suitcase in the car.  After surgery it may be brought to your room. For patients admitted to the hospital, discharge time will be determined by your treatment team.  Name and phone number of your driver:     Special instructions:  Special Instructions: [place] - Preparing for Surgery  Before surgery, you can play an important role.  Because skin is not sterile, your skin needs to be as free of germs as possible.  You can reduce the number of germs on you skin by washing with CHG (chlorahexidine gluconate) soap before surgery.  CHG is an antiseptic cleaner which kills germs and bonds with the skin to continue killing germs even after washing.  Please DO NOT use if you have an allergy to CHG or antibacterial soaps.  If your skin becomes reddened/irritated stop using the CHG and inform your nurse when you arrive at Short Stay.  Do not shave (including legs and underarms) for at least 48 hours prior to the first CHG shower.  You may shave your  face.  Please follow these instructions carefully:   1.  Shower with CHG Soap the night before surgery and the morning of Surgery.  2.  If you choose to wash your hair, wash your hair first as usual with your normal shampoo.  3.  After you shampoo, rinse your hair and body thoroughly to remove the Shampoo.  4.  Use CHG as you would any other liquid soap.  You can apply chg directly  to the skin and wash gently with scrungie or a clean washcloth.  5.  Apply the CHG Soap to your body ONLY FROM THE NECK DOWN.  Do not use on open wounds or open sores.  Avoid contact with your eyes ears, mouth and genitals (private parts).  Wash genitals (private parts)       with your normal soap.  6.  Wash thoroughly, paying special attention to the area where your surgery will be performed.  7.  Thoroughly rinse your body with warm water from the neck down.  8.  DO NOT shower/wash with your normal soap after using and rinsing off the CHG Soap.  9.  Pat yourself dry with a clean towel.            10.  Wear clean pajamas.            11 .  Place  clean sheets on your bed the night of your first shower and do not sleep with pets.  Day of Surgery  Do not apply any lotions/deodorants the morning of surgery.  Please wear clean clothes to the hospital/surgery center.  Please read over the following fact sheets that you were given. Pain Booklet, Coughing and Deep Breathing, Blood Transfusion Information and Surgical Site Infection Prevention

## 2015-07-10 LAB — HEMOGLOBIN A1C
Hgb A1c MFr Bld: 6.1 % — ABNORMAL HIGH (ref 4.8–5.6)
Mean Plasma Glucose: 128 mg/dL

## 2015-07-11 MED ORDER — DEXTROSE 5 % IV SOLN
2.0000 g | INTRAVENOUS | Status: AC
  Administered 2015-07-12: 2 g via INTRAVENOUS
  Filled 2015-07-11: qty 2

## 2015-07-11 MED ORDER — ALVIMOPAN 12 MG PO CAPS
12.0000 mg | ORAL_CAPSULE | ORAL | Status: AC
  Administered 2015-07-12: 12 mg via ORAL
  Filled 2015-07-11: qty 1

## 2015-07-11 MED ORDER — FLEET ENEMA 7-19 GM/118ML RE ENEM: 1.0000 | ENEMA | RECTAL | Status: DC

## 2015-07-12 ENCOUNTER — Inpatient Hospital Stay (HOSPITAL_COMMUNITY)
Admission: RE | Admit: 2015-07-12 | Discharge: 2015-07-15 | DRG: 349 | Disposition: A | Discharge status: Home Health | Payer: Medicare Other | Source: Ambulatory Visit | Attending: General Surgery | Admitting: General Surgery

## 2015-07-12 ENCOUNTER — Inpatient Hospital Stay (HOSPITAL_COMMUNITY): Payer: Medicare Other

## 2015-07-12 ENCOUNTER — Inpatient Hospital Stay (HOSPITAL_COMMUNITY): Payer: Medicare Other | Admitting: Certified Registered Nurse Anesthetist

## 2015-07-12 ENCOUNTER — Encounter (HOSPITAL_COMMUNITY)
Admission: RE | Disposition: A | Discharge status: Home Health | Payer: Self-pay | Source: Ambulatory Visit | Attending: General Surgery

## 2015-07-12 ENCOUNTER — Encounter (HOSPITAL_COMMUNITY): Payer: Self-pay

## 2015-07-12 DIAGNOSIS — Z888 Allergy status to other drugs, medicaments and biological substances status: Secondary | ICD-10-CM

## 2015-07-12 DIAGNOSIS — E119 Type 2 diabetes mellitus without complications: Secondary | ICD-10-CM | POA: Diagnosis present

## 2015-07-12 DIAGNOSIS — I10 Essential (primary) hypertension: Secondary | ICD-10-CM | POA: Diagnosis present

## 2015-07-12 DIAGNOSIS — Z432 Encounter for attention to ileostomy: Principal | ICD-10-CM

## 2015-07-12 DIAGNOSIS — K435 Parastomal hernia without obstruction or  gangrene: Secondary | ICD-10-CM | POA: Diagnosis present

## 2015-07-12 DIAGNOSIS — Z882 Allergy status to sulfonamides status: Secondary | ICD-10-CM

## 2015-07-12 DIAGNOSIS — Z932 Ileostomy status: Secondary | ICD-10-CM

## 2015-07-12 DIAGNOSIS — Z79818 Long term (current) use of other agents affecting estrogen receptors and estrogen levels: Secondary | ICD-10-CM | POA: Diagnosis not present

## 2015-07-12 DIAGNOSIS — Z881 Allergy status to other antibiotic agents status: Secondary | ICD-10-CM

## 2015-07-12 DIAGNOSIS — Z885 Allergy status to narcotic agent status: Secondary | ICD-10-CM | POA: Diagnosis not present

## 2015-07-12 DIAGNOSIS — I739 Peripheral vascular disease, unspecified: Secondary | ICD-10-CM | POA: Diagnosis present

## 2015-07-12 DIAGNOSIS — Z79899 Other long term (current) drug therapy: Secondary | ICD-10-CM

## 2015-07-12 DIAGNOSIS — E875 Hyperkalemia: Secondary | ICD-10-CM | POA: Diagnosis not present

## 2015-07-12 DIAGNOSIS — Z01818 Encounter for other preprocedural examination: Secondary | ICD-10-CM

## 2015-07-12 HISTORY — PX: PARASTOMAL HERNIA REPAIR: SHX2162

## 2015-07-12 HISTORY — PX: ILEOSTOMY CLOSURE: SHX1784

## 2015-07-12 LAB — GLUCOSE, CAPILLARY
GLUCOSE-CAPILLARY: 117 mg/dL — AB (ref 65–99)
GLUCOSE-CAPILLARY: 158 mg/dL — AB (ref 65–99)
Glucose-Capillary: 147 mg/dL — ABNORMAL HIGH (ref 65–99)
Glucose-Capillary: 162 mg/dL — ABNORMAL HIGH (ref 65–99)

## 2015-07-12 LAB — TYPE AND SCREEN
ABO/RH(D): A POS
ANTIBODY SCREEN: NEGATIVE

## 2015-07-12 SURGERY — CLOSURE, ILEOSTOMY
Anesthesia: General | Site: Abdomen

## 2015-07-12 MED ORDER — HYDROMORPHONE HCL 1 MG/ML IJ SOLN: 1.0000 mg | INTRAMUSCULAR | Status: DC | PRN

## 2015-07-12 MED ORDER — CITALOPRAM HYDROBROMIDE 20 MG PO TABS
10.0000 mg | ORAL_TABLET | Freq: Every day | ORAL | Status: DC
  Administered 2015-07-12 – 2015-07-14 (×3): 10 mg via ORAL
  Filled 2015-07-12 (×3): qty 1

## 2015-07-12 MED ORDER — CHLORHEXIDINE GLUCONATE CLOTH 2 % EX PADS: 6.0000 | MEDICATED_PAD | Freq: Once | CUTANEOUS | Status: DC

## 2015-07-12 MED ORDER — PROPOFOL 10 MG/ML IV BOLUS
INTRAVENOUS | Status: AC
  Filled 2015-07-12: qty 20

## 2015-07-12 MED ORDER — MIDAZOLAM HCL 2 MG/2ML IJ SOLN
INTRAMUSCULAR | Status: AC
  Filled 2015-07-12: qty 4

## 2015-07-12 MED ORDER — ONDANSETRON HCL 4 MG/2ML IJ SOLN
INTRAMUSCULAR | Status: AC
  Filled 2015-07-12: qty 2

## 2015-07-12 MED ORDER — GLYCOPYRROLATE 0.2 MG/ML IJ SOLN
INTRAMUSCULAR | Status: DC | PRN
  Administered 2015-07-12: 0.6 mg via INTRAVENOUS

## 2015-07-12 MED ORDER — INSULIN ASPART 100 UNIT/ML ~~LOC~~ SOLN
0.0000 [IU] | Freq: Three times a day (TID) | SUBCUTANEOUS | Status: DC
  Administered 2015-07-12 – 2015-07-13 (×2): 2 [IU] via SUBCUTANEOUS

## 2015-07-12 MED ORDER — ANASTROZOLE 1 MG PO TABS
1.0000 mg | ORAL_TABLET | Freq: Every day | ORAL | Status: DC
  Administered 2015-07-13 – 2015-07-15 (×3): 1 mg via ORAL
  Filled 2015-07-12 (×6): qty 1

## 2015-07-12 MED ORDER — FENTANYL CITRATE (PF) 100 MCG/2ML IJ SOLN
25.0000 ug | INTRAMUSCULAR | Status: DC | PRN
  Administered 2015-07-12 (×4): 25 ug via INTRAVENOUS

## 2015-07-12 MED ORDER — ONDANSETRON HCL 4 MG/2ML IJ SOLN
4.0000 mg | Freq: Four times a day (QID) | INTRAMUSCULAR | Status: DC | PRN
  Filled 2015-07-12: qty 2

## 2015-07-12 MED ORDER — FENTANYL CITRATE (PF) 100 MCG/2ML IJ SOLN
INTRAMUSCULAR | Status: AC
  Administered 2015-07-12: 25 ug via INTRAVENOUS
  Filled 2015-07-12: qty 2

## 2015-07-12 MED ORDER — POVIDONE-IODINE 10 % EX OINT
TOPICAL_OINTMENT | CUTANEOUS | Status: AC
  Filled 2015-07-12: qty 28.35

## 2015-07-12 MED ORDER — ROCURONIUM BROMIDE 100 MG/10ML IV SOLN
INTRAVENOUS | Status: DC | PRN
  Administered 2015-07-12: 35 mg via INTRAVENOUS

## 2015-07-12 MED ORDER — POTASSIUM CHLORIDE IN NACL 20-0.9 MEQ/L-% IV SOLN
INTRAVENOUS | Status: DC
  Administered 2015-07-12 – 2015-07-13 (×2): via INTRAVENOUS
  Filled 2015-07-12 (×2): qty 1000

## 2015-07-12 MED ORDER — LIDOCAINE HCL (CARDIAC) 20 MG/ML IV SOLN
INTRAVENOUS | Status: DC | PRN
  Administered 2015-07-12: 80 mg via INTRAVENOUS

## 2015-07-12 MED ORDER — ALVIMOPAN 12 MG PO CAPS
12.0000 mg | ORAL_CAPSULE | Freq: Two times a day (BID) | ORAL | Status: DC
  Administered 2015-07-13 – 2015-07-15 (×5): 12 mg via ORAL
  Filled 2015-07-12 (×5): qty 1

## 2015-07-12 MED ORDER — ALPRAZOLAM 0.25 MG PO TABS
0.1250 mg | ORAL_TABLET | Freq: Every evening | ORAL | Status: DC | PRN
Start: 2015-07-12 — End: 2015-07-15

## 2015-07-12 MED ORDER — GLYCOPYRROLATE 0.2 MG/ML IJ SOLN
INTRAMUSCULAR | Status: AC
  Filled 2015-07-12: qty 3

## 2015-07-12 MED ORDER — LOSARTAN POTASSIUM 50 MG PO TABS
100.0000 mg | ORAL_TABLET | Freq: Every day | ORAL | Status: DC
  Administered 2015-07-12 – 2015-07-15 (×4): 100 mg via ORAL
  Filled 2015-07-12 (×4): qty 2

## 2015-07-12 MED ORDER — NEOSTIGMINE METHYLSULFATE 10 MG/10ML IV SOLN
INTRAVENOUS | Status: AC
  Filled 2015-07-12: qty 1

## 2015-07-12 MED ORDER — ONDANSETRON HCL 4 MG PO TABS: 4.0000 mg | ORAL_TABLET | Freq: Four times a day (QID) | ORAL | Status: DC | PRN

## 2015-07-12 MED ORDER — ENOXAPARIN SODIUM 40 MG/0.4ML ~~LOC~~ SOLN
40.0000 mg | SUBCUTANEOUS | Status: DC
  Administered 2015-07-13: 40 mg via SUBCUTANEOUS
  Filled 2015-07-12: qty 0.4

## 2015-07-12 MED ORDER — 0.9 % SODIUM CHLORIDE (POUR BTL) OPTIME
TOPICAL | Status: DC | PRN
  Administered 2015-07-12: 1000 mL

## 2015-07-12 MED ORDER — NEOSTIGMINE METHYLSULFATE 10 MG/10ML IV SOLN
INTRAVENOUS | Status: DC | PRN
  Administered 2015-07-12: 4 mg via INTRAVENOUS

## 2015-07-12 MED ORDER — DEXTROSE 5 % IV SOLN
2.0000 g | Freq: Two times a day (BID) | INTRAVENOUS | Status: AC
  Administered 2015-07-12: 2 g via INTRAVENOUS
  Filled 2015-07-12: qty 2

## 2015-07-12 MED ORDER — METHOCARBAMOL 500 MG PO TABS
500.0000 mg | ORAL_TABLET | Freq: Three times a day (TID) | ORAL | Status: DC
  Administered 2015-07-12 – 2015-07-15 (×10): 500 mg via ORAL
  Filled 2015-07-12 (×10): qty 1

## 2015-07-12 MED ORDER — ONDANSETRON HCL 4 MG/2ML IJ SOLN
INTRAMUSCULAR | Status: DC | PRN
  Administered 2015-07-12: 4 mg via INTRAVENOUS

## 2015-07-12 MED ORDER — CEFOTETAN DISODIUM-DEXTROSE 2-2.08 GM-% IV SOLR
INTRAVENOUS | Status: AC
  Filled 2015-07-12: qty 50

## 2015-07-12 MED ORDER — DEXAMETHASONE SODIUM PHOSPHATE 4 MG/ML IJ SOLN
INTRAMUSCULAR | Status: AC
  Filled 2015-07-12: qty 1

## 2015-07-12 MED ORDER — FAMOTIDINE 20 MG PO TABS
20.0000 mg | ORAL_TABLET | Freq: Every day | ORAL | Status: DC
  Administered 2015-07-12 – 2015-07-15 (×4): 20 mg via ORAL
  Filled 2015-07-12 (×4): qty 1

## 2015-07-12 MED ORDER — FERROUS SULFATE 325 (65 FE) MG PO TABS
325.0000 mg | ORAL_TABLET | Freq: Two times a day (BID) | ORAL | Status: DC
  Administered 2015-07-12 – 2015-07-15 (×6): 325 mg via ORAL
  Filled 2015-07-12 (×6): qty 1

## 2015-07-12 MED ORDER — MIDAZOLAM HCL 5 MG/5ML IJ SOLN
INTRAMUSCULAR | Status: DC | PRN
  Administered 2015-07-12: 2 mg via INTRAVENOUS

## 2015-07-12 MED ORDER — LIDOCAINE HCL (CARDIAC) 20 MG/ML IV SOLN
INTRAVENOUS | Status: AC
  Filled 2015-07-12: qty 5

## 2015-07-12 MED ORDER — OXYCODONE-ACETAMINOPHEN 5-325 MG PO TABS
1.0000 | ORAL_TABLET | ORAL | Status: DC | PRN
  Administered 2015-07-12: 2 via ORAL
  Administered 2015-07-12 – 2015-07-15 (×7): 1 via ORAL
  Filled 2015-07-12: qty 2
  Filled 2015-07-12 (×3): qty 1
  Filled 2015-07-12: qty 2
  Filled 2015-07-12 (×3): qty 1

## 2015-07-12 MED ORDER — PROPOFOL 10 MG/ML IV BOLUS
INTRAVENOUS | Status: DC | PRN
  Administered 2015-07-12: 150 mg via INTRAVENOUS

## 2015-07-12 MED ORDER — ALUM & MAG HYDROXIDE-SIMETH 200-200-20 MG/5ML PO SUSP: 30.0000 mL | ORAL | Status: DC | PRN

## 2015-07-12 MED ORDER — FENTANYL CITRATE (PF) 250 MCG/5ML IJ SOLN
INTRAMUSCULAR | Status: AC
  Filled 2015-07-12: qty 5

## 2015-07-12 MED ORDER — LACTATED RINGERS IV SOLN
INTRAVENOUS | Status: DC | PRN
  Administered 2015-07-12: 08:00:00 via INTRAVENOUS

## 2015-07-12 MED ORDER — FENTANYL CITRATE (PF) 100 MCG/2ML IJ SOLN
INTRAMUSCULAR | Status: DC | PRN
  Administered 2015-07-12 (×2): 50 ug via INTRAVENOUS
  Administered 2015-07-12: 100 ug via INTRAVENOUS
  Administered 2015-07-12: 50 ug via INTRAVENOUS

## 2015-07-12 SURGICAL SUPPLY — 47 items
BLADE SURG ROTATE 9660 (MISCELLANEOUS) IMPLANT
CANISTER SUCTION 2500CC (MISCELLANEOUS) ×3 IMPLANT
COVER SURGICAL LIGHT HANDLE (MISCELLANEOUS) ×3 IMPLANT
DRAPE LAPAROSCOPIC ABDOMINAL (DRAPES) ×3 IMPLANT
DRAPE UTILITY XL STRL (DRAPES) ×6 IMPLANT
DRAPE WARM FLUID 44X44 (DRAPE) ×3 IMPLANT
ELECT REM PT RETURN 9FT ADLT (ELECTROSURGICAL) ×3
ELECTRODE REM PT RTRN 9FT ADLT (ELECTROSURGICAL) ×1 IMPLANT
GAUZE SPONGE 4X4 12PLY STRL (GAUZE/BANDAGES/DRESSINGS) ×3 IMPLANT
GLOVE BIO SURGEON STRL SZ 6.5 (GLOVE) ×2 IMPLANT
GLOVE BIO SURGEONS STRL SZ 6.5 (GLOVE) ×1
GLOVE BIOGEL PI IND STRL 7.0 (GLOVE) ×3 IMPLANT
GLOVE BIOGEL PI IND STRL 8 (GLOVE) ×1 IMPLANT
GLOVE BIOGEL PI INDICATOR 7.0 (GLOVE) ×6
GLOVE BIOGEL PI INDICATOR 8 (GLOVE) ×2
GLOVE ECLIPSE 7.5 STRL STRAW (GLOVE) ×3 IMPLANT
GLOVE SURG SS PI 7.0 STRL IVOR (GLOVE) ×6 IMPLANT
GOWN STRL REUS W/ TWL LRG LVL3 (GOWN DISPOSABLE) ×3 IMPLANT
GOWN STRL REUS W/TWL LRG LVL3 (GOWN DISPOSABLE) ×6
KIT BASIN OR (CUSTOM PROCEDURE TRAY) ×3 IMPLANT
KIT ROOM TURNOVER OR (KITS) ×3 IMPLANT
LIGASURE IMPACT 36 18CM CVD LR (INSTRUMENTS) IMPLANT
NS IRRIG 1000ML POUR BTL (IV SOLUTION) ×6 IMPLANT
PACK GENERAL/GYN (CUSTOM PROCEDURE TRAY) ×3 IMPLANT
PAD ARMBOARD 7.5X6 YLW CONV (MISCELLANEOUS) ×3 IMPLANT
SPECIMEN JAR MEDIUM (MISCELLANEOUS) IMPLANT
SPONGE GAUZE 4X4 12PLY STER LF (GAUZE/BANDAGES/DRESSINGS) ×3 IMPLANT
SPONGE LAP 18X18 X RAY DECT (DISPOSABLE) IMPLANT
STAPLER GUN LINEAR PROX 60 (STAPLE) ×3 IMPLANT
STAPLER PROXIMATE 55 BLUE (STAPLE) ×3 IMPLANT
STAPLER VISISTAT 35W (STAPLE) ×3 IMPLANT
SUCTION POOLE TIP (SUCTIONS) ×3 IMPLANT
SUT NOVA 1 T20/GS 25DT (SUTURE) ×6 IMPLANT
SUT PDS AB 1 TP1 96 (SUTURE) ×6 IMPLANT
SUT SILK 2 0 (SUTURE) ×2
SUT SILK 2 0 SH (SUTURE) ×6 IMPLANT
SUT SILK 2 0 SH CR/8 (SUTURE) ×3 IMPLANT
SUT SILK 2-0 18XBRD TIE 12 (SUTURE) ×1 IMPLANT
SUT SILK 3 0 (SUTURE) ×2
SUT SILK 3 0 SH CR/8 (SUTURE) ×3 IMPLANT
SUT SILK 3-0 18XBRD TIE 12 (SUTURE) ×1 IMPLANT
TAPE CLOTH SURG 4X10 WHT LF (GAUZE/BANDAGES/DRESSINGS) ×3 IMPLANT
TOWEL OR 17X24 6PK STRL BLUE (TOWEL DISPOSABLE) ×3 IMPLANT
TOWEL OR 17X26 10 PK STRL BLUE (TOWEL DISPOSABLE) ×3 IMPLANT
TRAY FOLEY CATH 14FRSI W/METER (CATHETERS) ×3 IMPLANT
WATER STERILE IRR 1000ML POUR (IV SOLUTION) IMPLANT
YANKAUER SUCT BULB TIP NO VENT (SUCTIONS) IMPLANT

## 2015-07-12 NOTE — Progress Notes (Signed)
Patient arrived via stretcher Alert, Oriented x4.. VSS. Dressing clean, dry and intact.

## 2015-07-12 NOTE — Anesthesia Postprocedure Evaluation (Signed)
  Anesthesia Post-op Note  Patient: Michele Palmer  Procedure(s) Performed: Procedure(s): REVERSAL OF LOOP ILEOSTOMY  (N/A) HERNIA REPAIR PARASTOMAL (N/A)  Patient Location: PACU  Anesthesia Type:General  Level of Consciousness: awake  Airway and Oxygen Therapy: Patient Spontanous Breathing  Post-op Pain: mild  Post-op Assessment: Post-op Vital signs reviewed              Post-op Vital Signs: Reviewed  Last Vitals:  Filed Vitals:   07/12/15 1350  BP: 140/73  Pulse: 74  Temp: 36.4 C  Resp: 18    Complications: No apparent anesthesia complications

## 2015-07-12 NOTE — Transfer of Care (Signed)
Immediate Anesthesia Transfer of Care Note  Patient: Michele Palmer  Procedure(s) Performed: Procedure(s): REVERSAL OF LOOP ILEOSTOMY  (N/A) HERNIA REPAIR PARASTOMAL (N/A)  Patient Location: PACU  Anesthesia Type:General  Level of Consciousness: awake, alert  and oriented  Airway & Oxygen Therapy: Patient Spontanous Breathing and Patient connected to nasal cannula oxygen  Post-op Assessment: Report given to RN and Post -op Vital signs reviewed and stable  Post vital signs: Reviewed and stable  Last Vitals:  Filed Vitals:   07/12/15 0608  BP: 148/58  Pulse: 64  Temp: 36.7 C  Resp: 20    Complications: No apparent anesthesia complications

## 2015-07-12 NOTE — H&P (Signed)
  Michele Palmer 04/27/2015 11:13 AM Location: [facility] Patient #: [id number] DOB: Sep 22, 1936 Married / Language: Michele Palmer / Race: White Female  History of Present Illness Michele Palmer CMA; 04/27/2015 11:14 AM) Patient words: reck.  The patient is a 79 year old female    Allergies Michele Palmer, CMA; 04/27/2015 11:14 AM) Aspirin *ANALGESICS - NonNarcotic* Ciprofloxacin *CHEMICALS* Codeine Phosphate *ANALGESICS - OPIOID* Sulfur (Antiseborrheic) *DERMATOLOGICALS*  Medication History (Michele Palmer, CMA; 04/27/2015 11:14 AM) Anastrozole (1MG  Tablet, Oral) Active. Citalopram Hydrobromide (20MG  Tablet, Oral) Active. Dicyclomine HCl (10MG  Capsule, Oral) Active. Losartan Potassium (100MG  Tablet, Oral) Active. Ranitidine HCl (150MG  Tablet, Oral) Active. Florastor (250MG  Capsule, Oral) Active. Medications Reconciled  Vitals (Michele Palmer CMA; 04/27/2015 11:14 AM) 04/27/2015 11:13 AM Weight: 169 lb Height: 63in Body Surface Area: 1.85 m Body Mass Index: 29.94 kg/m Temp.: 97.68F(Temporal)  Pulse: 117 (Regular)  BP: 130/80 (Sitting, Left Arm, Standard)    Physical Exam (Michele Dusza O. Naja Apperson MD; 04/27/2015 11:42 AM) Abdomen Palpation/Percussion Palpation and Percussion of the abdomen reveal - Soft and Non Tender. Auscultation Auscultation of the abdomen reveals - Bowel sounds normal and No Abdominal bruits. Lungs:  Clear.  Sats are okay. Ileostomy in place.  Fair bowel prep.  Parastomal hernia reduced currently.     Assessment & Plan Michele Rinks O. Janett Kamath MD; 04/27/2015 11:44 AM) ILEOSTOMY IN PLACE (V44.2  Z93.2) Impression: Will need reversal in the future. Will wait about 2 months after original operation. Will need water soluble enemal prior to reversal. Will =see back again in one month. POSTOP CHECK (V67.00  W29) Current Plans: The patient has had all of her preop evaluation for reversal of loop ileostomy.  No leak on preop study.  Off Xarelto.  Okay for  reversal of ileostomy.  Michele Palmer. Michele Bailiff, MD, [place] [phone] [facility]

## 2015-07-12 NOTE — Op Note (Signed)
OPERATIVE REPORT  DATE OF OPERATION: 07/12/2015  PATIENT:  Michele Palmer  79 y.o. female  PRE-OPERATIVE DIAGNOSIS:  ileostomy   POST-OPERATIVE DIAGNOSIS:  ileostomy   PROCEDURE:  Procedure(s): REVERSAL OF LOOP ILEOSTOMY  HERNIA REPAIR PARASTOMAL  SURGEON:  Surgeon(s): Judeth Horn, MD  ASSISTANT: None  ANESTHESIA:   general  EBL: <30 ml  BLOOD ADMINISTERED: none  DRAINS: none   SPECIMEN:  Source of Specimen:  Ileum/ileostomy  COUNTS CORRECT:  YES  PROCEDURE DETAILS: The patient was taken to the operating room and placed on the table in the supine position. A proper timeout was performed identifying the patient and procedure to be performed. After an adequate general endotracheal anesthesia was administered her loop ileostomy was closed using 2-0 silk sutures.  The patient was prepped and draped in the usual sterile manner. Electrocautery was used to incise the skin around the loop ileostomy on the right side. We taken down into the subcutaneous tissue using electrocautery and blunt dissection. There are multiple redundant loops of small bowel in the parastomal hernia which is dissected free. We dissected down to the abdominal wall fascia where we were able to completely a circumferentially detach the small bowel from the fascial edge.  Once we have the small bowel freed up a side-to-side anastomosis was made between the 2 limbs of the loop ileostomy using a GIA 55 stapler. We resected the distal 2-3 inches of ileostomy along with stapler across the resulting enterotomy using a TX 60 stapler.  This closed off the resulting enterotomy. The mesentery was closed using interrupted figure-of-eight stitch of 2-0 silk. We passed the bowel back into the perineal cavity then closed the fascia using interrupted figure-of-eight stitches of #1 Novafil. The wound was left open and packed with wet-to-dry saline soaked 4 x 4 gauze. All counts were correct including needles, sponges, and  instruments.  PATIENT DISPOSITION:  PACU - hemodynamically stable.   Staysha Truby 8/15/20169:24 AM

## 2015-07-12 NOTE — Anesthesia Procedure Notes (Signed)
Procedure Name: Intubation Date/Time: 07/12/2015 7:54 AM Performed by: [place] Pink Pre-anesthesia Checklist: Patient identified, Emergency Drugs available, Suction available, Patient being monitored and Timeout performed Patient Re-evaluated:Patient Re-evaluated prior to inductionOxygen Delivery Method: Circle system utilized Preoxygenation: Pre-oxygenation with 100% oxygen Intubation Type: IV induction Ventilation: Mask ventilation without difficulty Laryngoscope Size: Mac and 3 Grade View: Grade I Tube type: Oral Tube size: 7.0 mm Number of attempts: 1 Airway Equipment and Method: Stylet Placement Confirmation: ETT inserted through vocal cords under direct vision,  positive ETCO2 and breath sounds checked- equal and bilateral Secured at: 20 cm Tube secured with: Tape Dental Injury: Teeth and Oropharynx as per pre-operative assessment

## 2015-07-12 NOTE — Anesthesia Preprocedure Evaluation (Signed)
Anesthesia Evaluation  Patient identified by MRN, date of birth, ID band Patient awake    Reviewed: Allergy & Precautions, NPO status , Patient's Chart, lab work & pertinent test results  Airway Mallampati: II  TM Distance: >3 FB Neck ROM: Full    Dental   Pulmonary neg pulmonary ROS,  breath sounds clear to auscultation        Cardiovascular hypertension, + Peripheral Vascular Disease Rhythm:Regular Rate:Normal     Neuro/Psych    GI/Hepatic GERD-  ,  Endo/Other  diabetes  Renal/GU      Musculoskeletal   Abdominal   Peds  Hematology   Anesthesia Other Findings   Reproductive/Obstetrics                             Anesthesia Physical Anesthesia Plan  ASA: III  Anesthesia Plan: General   Post-op Pain Management:    Induction: Intravenous  Airway Management Planned:   Additional Equipment:   Intra-op Plan:   Post-operative Plan: Possible Post-op intubation/ventilation  Informed Consent: I have reviewed the patients History and Physical, chart, labs and discussed the procedure including the risks, benefits and alternatives for the proposed anesthesia with the patient or authorized representative who has indicated his/her understanding and acceptance.   Dental advisory given  Plan Discussed with: CRNA and Anesthesiologist  Anesthesia Plan Comments:         Anesthesia Quick Evaluation

## 2015-07-13 ENCOUNTER — Encounter (HOSPITAL_COMMUNITY): Payer: Self-pay | Admitting: General Surgery

## 2015-07-13 LAB — GLUCOSE, CAPILLARY
GLUCOSE-CAPILLARY: 111 mg/dL — AB (ref 65–99)
GLUCOSE-CAPILLARY: 109 mg/dL — AB (ref 65–99)
GLUCOSE-CAPILLARY: 137 mg/dL — AB (ref 65–99)
Glucose-Capillary: 123 mg/dL — ABNORMAL HIGH (ref 65–99)

## 2015-07-13 LAB — BASIC METABOLIC PANEL
Anion gap: 4 — ABNORMAL LOW (ref 5–15)
BUN: 10 mg/dL (ref 6–20)
CALCIUM: 9.2 mg/dL (ref 8.9–10.3)
CO2: 27 mmol/L (ref 22–32)
CREATININE: 0.78 mg/dL (ref 0.44–1.00)
Chloride: 103 mmol/L (ref 101–111)
GFR calc non Af Amer: >60 mL/min (ref >60)
Glucose, Bld: 137 mg/dL — ABNORMAL HIGH (ref 65–99)
Potassium: 5.4 mmol/L — ABNORMAL HIGH (ref 3.5–5.1)
SODIUM: 134 mmol/L — AB (ref 135–145)

## 2015-07-13 LAB — CBC
HCT: 36.1 % (ref 36.0–46.0)
Hemoglobin: 11.6 g/dL — ABNORMAL LOW (ref 12.0–15.0)
MCH: 29.4 pg (ref 26.0–34.0)
MCHC: 32.1 g/dL (ref 30.0–36.0)
MCV: 91.4 fL (ref 78.0–100.0)
PLATELETS: 234 10*3/uL (ref 150–400)
RBC: 3.95 MIL/uL (ref 3.87–5.11)
RDW: 15.8 % — AB (ref 11.5–15.5)
WBC: 8.6 10*3/uL (ref 4.0–10.5)

## 2015-07-13 MED ORDER — SODIUM CHLORIDE 0.9 % IJ SOLN: 3.0000 mL | INTRAMUSCULAR | Status: DC | PRN

## 2015-07-13 MED ORDER — RIVAROXABAN 20 MG PO TABS
20.0000 mg | ORAL_TABLET | Freq: Every day | ORAL | Status: DC
  Administered 2015-07-14 – 2015-07-15 (×2): 20 mg via ORAL
  Filled 2015-07-13 (×2): qty 1

## 2015-07-13 NOTE — Progress Notes (Signed)
Utilization review completed. Peighton Edgin, RN, BSN. 

## 2015-07-13 NOTE — Progress Notes (Signed)
Paged on-call surgery regarding the K=5.4 and Na=134 today since patient is still getting IVF NS with 68mEq KCL at 100 mL/hr, if still to continue current IVF since due for IVF bag replacement.

## 2015-07-13 NOTE — Consult Note (Signed)
WOC wound consult note Reason for Consult: placement of NPWT VAC dressing to surgical site/ostomy takedown Wound type: surgical  Measurement:6cm x 2.5xcm x 3.2cm  Wound MMI:[id number] tissue Drainage (amount, consistency, odor) serosanguinous, no odor Periwound: intact  Dressing procedure/placement/frequency: 2pc of black foam used, one to fill into the depth of the base of the wound, second to fill remainder of the wound. Seal at 128mmHG, continuous.  Pt. Tolerated well.   Will need to be changed again on Thursday, will need HHRN for VAC dressing changes.  Issacc Merlo Gladstone RN,CWOCN [phone]

## 2015-07-13 NOTE — Progress Notes (Signed)
GS Progress Note Subjective: Patient doing great.  Passing flatus.  Objective: Vital signs in last 24 hours: Temp:  [97.5 F (36.4 C)-98.1 F (36.7 C)] 98.1 F (36.7 C) (08/16 0413) Pulse Rate:  [58-81] 65 (08/16 0413) Resp:  [10-25] 18 (08/16 0413) BP: (100-165)/(51-106) 125/51 mmHg (08/16 0413) SpO2:  [93 %-100 %] 93 % (08/16 0413) Weight:  [82.1 kg (181 lb)] 82.1 kg (181 lb) (08/15 1038) Last BM Date: 07/11/15  Intake/Output from previous day: 08/15 0701 - 08/16 0700 In: 2415 [P.O.:840; I.V.:1575] Out: 1200 [Urine:1175; Blood:25] Intake/Output this shift:    Lungs: Clear  Abd: Excellent bowel sounds.  Ostomy site is clean and would benefit from negative pressure wound therapy.  Extremities: No clinical sign or symptoms of DVT.  Will restart Xarelto  Neuro: Intact  Lab Results: CBC   Recent Labs  07/13/15 0309  WBC 8.6  HGB 11.6*  HCT 36.1  PLT 234   BMET  Recent Labs  07/13/15 0309  NA 134*  K 5.4*  CL 103  CO2 27  GLUCOSE 137*  BUN 10  CREATININE 0.78  CALCIUM 9.2   PT/INR No results for input(s): LABPROT, INR in the last 72 hours. ABG No results for input(s): PHART, HCO3 in the last 72 hours.  Invalid input(s): PCO2, PO2  Studies/Results: Dg Chest 2 View  07/12/2015   CLINICAL DATA:  Pre-op for reversal loop ileostomy. Initial encounter.  EXAM: CHEST  2 VIEW  COMPARISON:  07/09/2015 radiographs.  CT 05/07/2015.  FINDINGS: The heart size and mediastinal contours are stable. There are stable partially loculated right-greater-than-left pleural effusions and linear scarring at the right lung base. Postsurgical changes are present within the right breast and right axilla. The bones appear unchanged.  IMPRESSION: Stable examination.  No acute cardiopulmonary process.   Electronically Signed   By: Richardean Sale M.D.   On: 07/12/2015 07:08    Anti-infectives: Anti-infectives    Start     Dose/Rate Route Frequency Ordered Stop   07/12/15 1045   cefoTEtan (CEFOTAN) 2 g in dextrose 5 % 50 mL IVPB     2 g 100 mL/hr over 30 Minutes Intravenous Every 12 hours 07/12/15 1044 07/12/15 1256   07/12/15 0546  cefoTEtan in Dextrose 5% (CEFOTAN) 2-2.08 GM-% IVPB    Comments:  Sharmaine Base   : cabinet override      07/12/15 0546 07/12/15 1759   07/12/15 0500  cefoTEtan (CEFOTAN) 2 g in dextrose 5 % 50 mL IVPB     2 g 100 mL/hr over 30 Minutes Intravenous To [facility] 07/11/15 1142 07/12/15 0755      Assessment/Plan: s/p Procedure(s): REVERSAL OF LOOP ILEOSTOMY  HERNIA REPAIR PARASTOMAL Advance diet VAC dressing.  Possible home tomorrow.  LOS: 1 day    [name]. [name], MD, FACS [phone] [facility] 07/13/2015

## 2015-07-14 LAB — GLUCOSE, CAPILLARY
GLUCOSE-CAPILLARY: 100 mg/dL — AB (ref 65–99)
GLUCOSE-CAPILLARY: 111 mg/dL — AB (ref 65–99)
Glucose-Capillary: 94 mg/dL (ref 65–99)
Glucose-Capillary: 105 mg/dL — ABNORMAL HIGH (ref 65–99)

## 2015-07-14 LAB — BASIC METABOLIC PANEL
Anion gap: 5 (ref 5–15)
BUN: 13 mg/dL (ref 6–20)
CALCIUM: 9.2 mg/dL (ref 8.9–10.3)
CO2: 28 mmol/L (ref 22–32)
CREATININE: 0.71 mg/dL (ref 0.44–1.00)
Chloride: 101 mmol/L (ref 101–111)
GLUCOSE: 96 mg/dL (ref 65–99)
Potassium: 3.9 mmol/L (ref 3.5–5.1)
Sodium: 134 mmol/L — ABNORMAL LOW (ref 135–145)

## 2015-07-14 NOTE — Progress Notes (Signed)
GS Progress Note Subjective: Patient up in chair, doing very well.  Objective: Vital signs in last 24 hours: Temp:  [98.3 F (36.8 C)-98.8 F (37.1 C)] 98.3 F (36.8 C) (08/17 0628) Pulse Rate:  [63-78] 63 (08/17 0628) Resp:  [17-18] 18 (08/17 0628) BP: (126-140)/(57-77) 140/77 mmHg (08/17 0628) SpO2:  [93 %-95 %] 95 % (08/17 0628) Last BM Date: 07/11/15  Intake/Output from previous day: 08/16 0701 - 08/17 0700 In: 73 [P.O.:460] Out: 1220 [Urine:1200; Drains:20] Intake/Output this shift:    Lungs: Clear to auscultation  Abd: soft, good bowel sounds.  No bowel movement or flatus  Extremities: No changes.  No clinical signs or symptoms of DVT  Neuro: Intact  Lab Results: CBC   Recent Labs  07/13/15 0309  WBC 8.6  HGB 11.6*  HCT 36.1  PLT 234   BMET  Recent Labs  07/13/15 0309 07/14/15 0524  NA 134* 134*  K 5.4* 3.9  CL 103 101  CO2 27 28  GLUCOSE 137* 96  BUN 10 13  CREATININE 0.78 0.71  CALCIUM 9.2 9.2   PT/INR No results for input(s): LABPROT, INR in the last 72 hours. ABG No results for input(s): PHART, HCO3 in the last 72 hours.  Invalid input(s): PCO2, PO2  Studies/Results: No results found.  Anti-infectives: Anti-infectives    Start     Dose/Rate Route Frequency Ordered Stop   07/12/15 1045  cefoTEtan (CEFOTAN) 2 g in dextrose 5 % 50 mL IVPB     2 g 100 mL/hr over 30 Minutes Intravenous Every 12 hours 07/12/15 1044 07/12/15 1256   07/12/15 0546  cefoTEtan in Dextrose 5% (CEFOTAN) 2-2.08 GM-% IVPB    Comments:  Sharmaine Base   : cabinet override      07/12/15 0546 07/12/15 1759   07/12/15 0500  cefoTEtan (CEFOTAN) 2 g in dextrose 5 % 50 mL IVPB     2 g 100 mL/hr over 30 Minutes Intravenous To [facility] 07/11/15 1142 07/12/15 0755      Assessment/Plan: s/p Procedure(s): REVERSAL OF LOOP ILEOSTOMY  HERNIA REPAIR PARASTOMAL PAS Plan for discharge tomorrow Hyperkalemia resolved.  LOS: 2 days    [name]. [name], MD, FACS [phone] [facility] 07/14/2015

## 2015-07-15 LAB — GLUCOSE, CAPILLARY
Glucose-Capillary: 91 mg/dL (ref 65–99)
Glucose-Capillary: 96 mg/dL (ref 65–99)

## 2015-07-15 MED ORDER — OXYCODONE-ACETAMINOPHEN 5-325 MG PO TABS: 1.0000 | ORAL_TABLET | ORAL | Status: DC | PRN

## 2015-07-15 NOTE — Progress Notes (Signed)
Rebeca Allegra to be D/C'd Home per MD order.  Discussed with the patient and all questions fully answered.  VSS, Skin clean, dry and intact without evidence of skin break down, no evidence of skin tears noted. IV catheter discontinued intact. Site without signs and symptoms of complications. Dressing and pressure applied.  An After Visit Summary was printed and given to the patient. Patient received prescription.  D/c education completed with patient/family including follow up instructions, medication list, d/c activities limitations if indicated, with other d/c instructions as indicated by MD - patient able to verbalize understanding, all questions fully answered.   Pt and daughter have been waiting for wound vac materials to be delivered from home health and now that those have arrived pt will be able to discharge.    Patient instructed to return to ED, call 911, or call MD for any changes in condition.   Patient will be escorted via [facility] when read, and D/C home via private auto.  Norva Karvonen 07/15/2015 3:59 PM

## 2015-07-15 NOTE — Discharge Summary (Signed)
Physician Discharge Summary  Patient ID: Michele Palmer MRN: [id number] DOB/AGE: 01/04/1936 79 y.o.  Admit date: 07/12/2015 Discharge date: 07/15/2015  Admission Diagnoses:  Discharge Diagnoses:  Active Problems:   Ileostomy in place   Discharged Condition: good  Hospital Course: [place] admitted after loop ileostomy reversal with SB resection.  Did well.  Negative pressure wound therapy dressing placed on previous ostomy site POD #1.  Bowel movement the day of discharge.  No fevers or chills.  Consults: Wound care nurse  Significant Diagnostic Studies: labs: CBC/Bmet  Treatments: IV hydration, antibiotics: Cefotetan, analgesia: Dilaudid and Percocet and surgery: Reversal of loop ileostomy  Discharge Exam: Blood pressure 114/72, pulse 69, temperature 98.2 F (36.8 C), temperature source Oral, resp. rate 17, height 5\' 3"  (1.6 m), weight 82.1 kg (181 lb), SpO2 95 %. General appearance: alert, cooperative, appears stated age and no distress Resp: clear to auscultation bilaterally GI: soft, non-tender; bowel sounds normal; no masses,  no organomegaly and Ostomy site with VAC dressing in place Extremities: extremities normal, atraumatic, no cyanosis or edema  Disposition: 06-Home-Health Care Svc  Discharge Instructions    Call MD for:  difficulty breathing, headache or visual disturbances    Complete by:  As directed      Call MD for:  extreme fatigue    Complete by:  As directed      Call MD for:  hives    Complete by:  As directed      Call MD for:  persistant dizziness or light-headedness    Complete by:  As directed      Call MD for:  persistant nausea and vomiting    Complete by:  As directed      Call MD for:  redness, tenderness, or signs of infection (pain, swelling, redness, odor or green/yellow discharge around incision site)    Complete by:  As directed      Call MD for:  severe uncontrolled pain    Complete by:  As directed      Call MD for:  temperature >100.4     Complete by:  As directed      Change dressing (specify)    Complete by:  As directed   Dressing change: Per home health/negative pressure wound therapy     Diet - low sodium heart healthy    Complete by:  As directed      Discharge instructions    Complete by:  As directed   See specific instructions about ileostomy reversal     Driving Restrictions    Complete by:  As directed   No driving for 2 weeks     Increase activity slowly    Complete by:  As directed      Lifting restrictions    Complete by:  As directed   No lifting > 20 pounds for the next 6 weeks.            Medication List    STOP taking these medications        amoxicillin-clavulanate 875-125 MG per tablet  Commonly known as:  AUGMENTIN     bacitracin ointment     erythromycin base 500 MG tablet  Commonly known as:  E-MYCIN     neomycin 500 MG tablet  Commonly known as:  MYCIFRADIN      TAKE these medications        acetaminophen 500 MG tablet  Commonly known as:  TYLENOL  Take 500-1,000 mg by mouth every 6 (six)  hours as needed (pain).     ALPRAZolam 0.25 MG tablet  Commonly known as:  XANAX  Take 0.125 mg by mouth at bedtime as needed for sleep.     alum & mag hydroxide-simeth 200-200-20 MG/5ML suspension  Commonly known as:  MAALOX/MYLANTA  Take 30 mLs by mouth every 4 (four) hours as needed for indigestion or heartburn.     anastrozole 1 MG tablet  Commonly known as:  ARIMIDEX  TAKE 1 TABLET BY MOUTH EVERY DAY     citalopram 20 MG tablet  Commonly known as:  CELEXA  Take 10 mg by mouth at bedtime.     feeding supplement (PRO-STAT SUGAR FREE 64) Liqd  Take 30 mLs by mouth 2 (two) times daily.     feeding supplement Liqd  Take 1 Container by mouth daily.     feeding supplement Liqd  Take 1 Container by mouth 2 (two) times daily between meals.     ferrous sulfate 325 (65 FE) MG tablet  Take 1 tablet (325 mg total) by mouth 2 (two) times daily with a meal.     losartan 100 MG  tablet  Commonly known as:  COZAAR  Take 100 mg by mouth daily.     methocarbamol 500 MG tablet  Commonly known as:  ROBAXIN  Take 1 tablet (500 mg total) by mouth every 8 (eight) hours.     multivitamin with minerals Tabs tablet  Take 1 tablet by mouth daily.     oxyCODONE-acetaminophen 5-325 MG per tablet  Commonly known as:  PERCOCET/ROXICET  Take 1-2 tablets by mouth every 4 (four) hours as needed for moderate pain.     ranitidine 150 MG tablet  Commonly known as:  ZANTAC  Take 150 mg by mouth 2 (two) times daily as needed (acid reflux).     rivaroxaban 20 MG Tabs tablet  Commonly known as:  XARELTO  Take 20 mg by mouth daily with breakfast.           Follow-up Information    Follow up with Judeth Horn, MD On 08/03/2015.   Specialty:  General Surgery   Why:  For wound re-check, 11:00 AM   Contact information:   [address] [place] [address] [phone]       Signed: Judeth Horn 07/15/2015, 9:38 AM

## 2015-07-15 NOTE — Care Management Note (Signed)
Case Management Note  Patient Details  Name: HARRIS KISTLER MRN: [id number] Date of Birth: 1936-03-25  Subjective/Objective:                 Pt s/p ileostomy takedown and hernia repair on 8/15.  VAC placed postop.  PTA, pt independent of ADLs, has supportive daughter.    Action/Plan: Will need HHRN for VAC wound changes at dc; pt prefers [facility], as has used in the past.  Start of care 24-48h post dc date.  VAC approved and for delivery before 4pm today.     Expected Discharge Date:    07/15/15              Expected Discharge Plan:  [place]  In-House Referral:     Discharge planning Services  CM Consult  Post Acute Care Choice:  Home Health Choice offered to:  Patient  DME Arranged:  Vac DME Agency:  [facility]  HH Arranged:  RN [place] Agency:  [facility]  Status of Service:  Completed, signed off  Medicare Important Message Given:  Yes-second notification given Date Medicare IM Given:    Medicare IM give by:    Date Additional Medicare IM Given:    Additional Medicare Important Message give by:     If discussed at [place] of Stay Meetings, dates discussed:    Additional Comments:  Reinaldo Raddle, RN, BSN  Trauma/Neuro ICU Case Manager [phone]

## 2015-07-15 NOTE — Discharge Instructions (Signed)
Loop Ileostomy Reversal A loop ileostomy reversal is surgery that reverses a temporary loop ileostomy. The small intestine is disconnected from the opening in the abdomen (stoma). It is then reconnected inside the body. A stoma and bag are no longer needed. Bowel movements can resume through the rectum. A reversal may be done after another part of your bowel has had time to heal and your caregiver feels that you are healthy enough to have surgery. LET YOUR CAREGIVER KNOW ABOUT:  Allergies to food or medicine.  Medicines taken, including vitamins, health supplements, herbs, eyedrops, over-the-counter medicines, and creams.  Use of steroids (by mouth or creams).  Previous problems with anesthetics or numbing medicines.  History of bleeding problems or blood clots.  Previous surgery.  Other health problems, including diabetes and kidney problems.  Possibility of pregnancy, if this applies. RISKS AND COMPLICATIONS  Reaction to anesthesia.  Infection inside the abdominal cavity (abscess).  Wound infection where the stoma was.  Blood clot.  Bleeding.  Scarring.  Slow recovery of intestinal function (ileus).  Intestinal blockage.  Intestinal leakage.  Narrowing of the joined area. BEFORE THE PROCEDURE It is important to follow your caregiver's instructions prior to your procedure. This helps to avoid complications. Steps before your procedure may include:  A physical exam, rectal exam, X-rays, and other procedures.  Chemotherapy or radiation therapy if your ileostomy was done as part of a cancer surgery.  A review of the procedure, the anesthesia being used, and what to expect after the procedure. You may be asked to:  Stop taking certain medicines for several days prior to your procedure. This may include blood thinners (such as aspirin).  Take certain medicines, such as stool softeners.  Follow a special diet for several days prior to the procedure.  Avoid eating  and drinking after midnight the night before the procedure. This will help you to avoid complications from the anesthesia.  Quit smoking. Smoking increases the chances of a healing problem after your procedure. PROCEDURE You will be given medicine that makes you sleep (general anesthetic).The surgeon will make a small cut (incision) around the stoma. The surgeon will then stitch or staple the 2 ends of the intestine back together, leaving the joined intestine inside the abdominal cavity. AFTER THE PROCEDURE  You will be given pain medicine.  Your caregivers will slowly increase your diet and movement.  You can expect to be in the hospital for about 3 to 5 days.  You should arrange for someone to help you with activities at home while you recover.  Negative pressure wound therapy to be managed by Home Health  Follow up to see Dr. Hulen Skains on 9/6 at 11:00 AM Document Released: 11/02/2011 Document Revised: 02/05/2012 Document Reviewed: 11/02/2011 [facility] Patient Information 2015 [place], [place]. This information is not intended to replace advice given to you by your health care provider. Make sure you discuss any questions you have with your health care provider.

## 2015-07-15 NOTE — Care Management Important Message (Signed)
Important Message  Patient Details  Name: Michele Palmer MRN: [id number] Date of Birth: 03/06/1936   Medicare Important Message Given:  Yes-second notification given    Delorse Lek 07/15/2015, 2:26 PM

## 2015-07-15 NOTE — Progress Notes (Signed)
GS Progress Note Subjective: Patient doing well but has not had a bowel movement yet.  Objective: Vital signs in last 24 hours: Temp:  [98.2 F (36.8 C)-98.5 F (36.9 C)] 98.2 F (36.8 C) (08/18 0608) Pulse Rate:  [69-70] 69 (08/18 0608) Resp:  [17-18] 17 (08/18 0608) BP: (114-148)/(71-75) 114/72 mmHg (08/18 0608) SpO2:  [95 %] 95 % (08/18 0608) Last BM Date: 07/11/15  Intake/Output from previous day: 08/17 0701 - 08/18 0700 In: 1231 [P.O.:1231] Out: 950 [Urine:950] Intake/Output this shift:    Lungs: Clear  Abd: Soft, good bowel sounds. Lots of flatus, no bowel movement  Extremities: No clinical signs or symptoms of DVT  Neuro: Intact  Lab Results: CBC   Recent Labs  07/13/15 0309  WBC 8.6  HGB 11.6*  HCT 36.1  PLT 234   BMET  Recent Labs  07/13/15 0309 07/14/15 0524  NA 134* 134*  K 5.4* 3.9  CL 103 101  CO2 27 28  GLUCOSE 137* 96  BUN 10 13  CREATININE 0.78 0.71  CALCIUM 9.2 9.2   PT/INR No results for input(s): LABPROT, INR in the last 72 hours. ABG No results for input(s): PHART, HCO3 in the last 72 hours.  Invalid input(s): PCO2, PO2  Studies/Results: No results found.  Anti-infectives: Anti-infectives    Start     Dose/Rate Route Frequency Ordered Stop   07/12/15 1045  cefoTEtan (CEFOTAN) 2 g in dextrose 5 % 50 mL IVPB     2 g 100 mL/hr over 30 Minutes Intravenous Every 12 hours 07/12/15 1044 07/12/15 1256   07/12/15 0546  cefoTEtan in Dextrose 5% (CEFOTAN) 2-2.08 GM-% IVPB    Comments:  Sharmaine Base   : cabinet override      07/12/15 0546 07/12/15 1759   07/12/15 0500  cefoTEtan (CEFOTAN) 2 g in dextrose 5 % 50 mL IVPB     2 g 100 mL/hr over 30 Minutes Intravenous To [facility]Stay [facility] 07/11/15 1142 07/12/15 0755      Assessment/Plan: s/p Procedure(s): REVERSAL OF LOOP ILEOSTOMY  HERNIA REPAIR PARASTOMAL Will very likely discharge this afternood if the patient has a bowel movement.  I want her to stay until BM because  of the relatively low anastomosis that she had.  LOS: 3 days    [name]. [name], MD, FACS [phone] [id number] [facility] 07/15/2015

## 2015-08-20 ENCOUNTER — Telehealth: Payer: Self-pay | Admitting: Hematology and Oncology

## 2015-08-20 NOTE — Telephone Encounter (Signed)
Returned patients call as she cancelled her last appointment and now is out of meds,we have scheduled her for tues 9/27  anne

## 2015-08-24 ENCOUNTER — Telehealth: Payer: Self-pay | Admitting: Hematology and Oncology

## 2015-08-24 ENCOUNTER — Other Ambulatory Visit (HOSPITAL_BASED_OUTPATIENT_CLINIC_OR_DEPARTMENT_OTHER): Payer: Medicare Other

## 2015-08-24 ENCOUNTER — Encounter: Payer: Self-pay | Admitting: Hematology and Oncology

## 2015-08-24 ENCOUNTER — Ambulatory Visit (HOSPITAL_BASED_OUTPATIENT_CLINIC_OR_DEPARTMENT_OTHER): Payer: Medicare Other | Admitting: Hematology and Oncology

## 2015-08-24 VITALS — BP 151/74 | HR 66 | Temp 97.7°F | Resp 18 | Ht 63.0 in | Wt 184.6 lb

## 2015-08-24 DIAGNOSIS — Z170 Estrogen receptor positive status [ER+]: Secondary | ICD-10-CM

## 2015-08-24 DIAGNOSIS — C50511 Malignant neoplasm of lower-outer quadrant of right female breast: Secondary | ICD-10-CM

## 2015-08-24 DIAGNOSIS — Z79811 Long term (current) use of aromatase inhibitors: Secondary | ICD-10-CM

## 2015-08-24 LAB — COMPREHENSIVE METABOLIC PANEL (CC13)
ALBUMIN: 3.5 g/dL (ref 3.5–5.0)
ALT: 14 U/L (ref 0–55)
ANION GAP: 8 meq/L (ref 3–11)
AST: 15 U/L (ref 5–34)
Alkaline Phosphatase: 110 U/L (ref 40–150)
BILIRUBIN TOTAL: 0.37 mg/dL (ref 0.20–1.20)
BUN: 23.4 mg/dL (ref 7.0–26.0)
CALCIUM: 9.6 mg/dL (ref 8.4–10.4)
CO2: 28 mEq/L (ref 22–29)
CREATININE: 0.8 mg/dL (ref 0.6–1.1)
Chloride: 104 mEq/L (ref 98–109)
EGFR: 67 mL/min/{1.73_m2} — ABNORMAL LOW (ref >90)
Glucose: 131 mg/dl (ref 70–140)
Potassium: 4.5 mEq/L (ref 3.5–5.1)
Sodium: 140 mEq/L (ref 136–145)
TOTAL PROTEIN: 7.3 g/dL (ref 6.4–8.3)

## 2015-08-24 LAB — CBC WITH DIFFERENTIAL/PLATELET
BASO%: 0.7 % (ref 0.0–2.0)
Basophils Absolute: 0.1 10*3/uL (ref 0.0–0.1)
EOS%: 1.5 % (ref 0.0–7.0)
Eosinophils Absolute: 0.1 10*3/uL (ref 0.0–0.5)
HEMATOCRIT: 39 % (ref 34.8–46.6)
HEMOGLOBIN: 12.9 g/dL (ref 11.6–15.9)
LYMPH#: 2.5 10*3/uL (ref 0.9–3.3)
LYMPH%: 32.1 % (ref 14.0–49.7)
MCH: 30.4 pg (ref 25.1–34.0)
MCHC: 33 g/dL (ref 31.5–36.0)
MCV: 91.9 fL (ref 79.5–101.0)
MONO#: 0.6 10*3/uL (ref 0.1–0.9)
MONO%: 7.8 % (ref 0.0–14.0)
NEUT%: 57.9 % (ref 38.4–76.8)
NEUTROS ABS: 4.6 10*3/uL (ref 1.5–6.5)
PLATELETS: 236 10*3/uL (ref 145–400)
RBC: 4.24 10*6/uL (ref 3.70–5.45)
RDW: 14.9 % — ABNORMAL HIGH (ref 11.2–14.5)
WBC: 7.9 10*3/uL (ref 3.9–10.3)

## 2015-08-24 MED ORDER — ANASTROZOLE 1 MG PO TABS: 1.0000 mg | ORAL_TABLET | Freq: Every day | ORAL | Status: DC

## 2015-08-24 NOTE — Telephone Encounter (Signed)
Gave avs & calendar for March. °

## 2015-08-24 NOTE — Progress Notes (Signed)
Patient Care Team: Glenda Chroman, MD as PCP - General (Internal Medicine)  DIAGNOSIS: No matching staging information was found for the patient.  SUMMARY OF ONCOLOGIC HISTORY:   Breast cancer of lower-outer quadrant of right female breast   06/10/2013 Initial Diagnosis Invasive lobular cancer ER/PR positive HER-2 negative   08/01/2013 Surgery Right lumpectomy: Invasive lobular cancer 1.8 cm, grade 1, margins clear, 0/2 lymph nodes, ER positive, PR positive, HER-2 negative, Ki-67 11%, T1 cN0 stage IA   09/15/2013 - 10/31/2013 Radiation Therapy Adjuvant radiation therapy by Dr. Orlene Erm at [facility]   11/10/2013 -  Anti-estrogen oral therapy Anastrozole 1 mg daily 5 years    CHIEF COMPLIANT: Follow-up on anastrozole  INTERVAL HISTORY: Michele Palmer is a 79 year old with above-mentioned history of right breast invasive lobular cancer treated with lumpectomy and adjuvant radiation. She was on anastrozole but because of her multiple surgeries, she did not come back to see and hence she was taking anastrozole every other day. 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 This was later repaired. She initially had a colostomy and ileostomy. Recently both of these were taken down. She is here today accompanied by her daughter. She is going to have mammograms done at [facility].  REVIEW OF SYSTEMS:   Constitutional: Denies fevers, chills or abnormal weight loss Eyes: Denies blurriness of vision Ears, nose, mouth, throat, and face: Denies mucositis or sore throat Respiratory: Denies cough, dyspnea or wheezes Cardiovascular: Denies palpitation, chest discomfort or lower extremity swelling Gastrointestinal:  Denies nausea, heartburn or change in bowel habits Skin: Denies abnormal skin rashes Lymphatics: Denies new lymphadenopathy or easy bruising Neurological:Denies numbness, tingling or new weaknesses Behavioral/Psych: Mood is stable, no new changes  Breast:  denies any pain or  lumps or nodules in either breasts All other systems were reviewed with the patient and are negative.  I have reviewed the past medical history, past surgical history, social history and family history with the patient and they are unchanged from previous note.  ALLERGIES:  is allergic to aspirin; ciprofloxacin; codeine; and sulfur.  MEDICATIONS:  Current Outpatient Prescriptions  Medication Sig Dispense Refill  . acetaminophen (TYLENOL) 500 MG tablet Take 500-1,000 mg by mouth every 6 (six) hours as needed (pain).    Marland Kitchen ALPRAZolam (XANAX) 0.25 MG tablet Take 0.125 mg by mouth at bedtime as needed for sleep.     Marland Kitchen anastrozole (ARIMIDEX) 1 MG tablet Take 1 tablet (1 mg total) by mouth daily. 90 tablet 3  . citalopram (CELEXA) 10 MG tablet Patient taking 25m    . losartan (COZAAR) 100 MG tablet Take 100 mg by mouth daily.    . Multiple Vitamin (MULTIVITAMIN WITH MINERALS) TABS tablet Take 1 tablet by mouth daily. 60 tablet 0  . ranitidine (ZANTAC) 150 MG tablet Take 150 mg by mouth 2 (two) times daily as needed (acid reflux).     . rivaroxaban (XARELTO) 20 MG TABS tablet Take 20 mg by mouth daily with breakfast.     No current facility-administered medications for this visit.    PHYSICAL EXAMINATION: ECOG PERFORMANCE STATUS: 1 - Symptomatic but completely ambulatory  Filed Vitals:   08/24/15 1353  BP: 151/74  Pulse: 66  Temp: 97.7 F (36.5 C)  Resp: 18   Filed Weights   08/24/15 1353  Weight: 184 lb 9.6 oz (83.734 kg)    GENERAL:alert, no distress and comfortable SKIN: skin color, texture, turgor are normal, no rashes or significant lesions EYES:  normal, Conjunctiva are pink and non-injected, sclera clear OROPHARYNX:no exudate, no erythema and lips, buccal mucosa, and tongue normal  NECK: supple, thyroid normal size, non-tender, without nodularity LYMPH:  no palpable lymphadenopathy in the cervical, axillary or inguinal LUNGS: clear to auscultation and percussion with normal  breathing effort HEART: regular rate & rhythm and no murmurs and no lower extremity edema ABDOMEN:abdomen soft, non-tender and normal bowel sounds Musculoskeletal:no cyanosis of digits and no clubbing  NEURO: alert & oriented x 3 with fluent speech, no focal motor/sensory deficits BREAST: No palpable masses or nodules in either right or left breasts. Scar tissue related to prior surgery in the right breast and axilla. No palpable axillary supraclavicular or infraclavicular adenopathy no breast tenderness or nipple discharge. (exam performed in the presence of a chaperone)  LABORATORY DATA:  I have reviewed the data as listed   Chemistry      Component Value Date/Time   NA 140 08/24/2015 1338   NA 134* 07/14/2015 0524   K 4.5 08/24/2015 1338   K 3.9 07/14/2015 0524   CL 101 07/14/2015 0524   CO2 28 08/24/2015 1338   CO2 28 07/14/2015 0524   BUN 23.4 08/24/2015 1338   BUN 13 07/14/2015 0524   CREATININE 0.8 08/24/2015 1338   CREATININE 0.71 07/14/2015 0524      Component Value Date/Time   CALCIUM 9.6 08/24/2015 1338   CALCIUM 9.2 07/14/2015 0524   ALKPHOS 110 08/24/2015 1338   ALKPHOS 99 07/09/2015 1122   AST 15 08/24/2015 1338   AST 18 07/09/2015 1122   ALT 14 08/24/2015 1338   ALT 16 07/09/2015 1122   BILITOT 0.37 08/24/2015 1338   BILITOT 0.4 07/09/2015 1122       Lab Results  Component Value Date   WBC 7.9 08/24/2015   HGB 12.9 08/24/2015   HCT 39.0 08/24/2015   MCV 91.9 08/24/2015   PLT 236 08/24/2015   NEUTROABS 4.6 08/24/2015   ASSESSMENT & PLAN:  Breast cancer of lower-outer quadrant of right female breast Right breast invasive lobular carcinoma diagnosed July 1 2014status post lumpectomy 08/01/2013, 1.8 cm ILC, grade 1, 0/2 sentinel nodes, ER/PR positive, HER-2 negative, Ki-67 11%, T1 cN0 stage IA status post radiation by Dr. Orlene Erm in [place], on Arimidex 1 mg daily since 11/10/2013, patient could not come back to see [place] and was taking it every other  day.  Recommendation: 1. Resume Arimidex 1 mg daily 2. Breast cancer surveillance with breast exam 08/24/2015 is normal 3. Mammograms to be done annually, patient gets it done at [facility]. I discussed the importance of doing 3-D mammograms.  Return to clinic in 6 months for follow-up after that we could see her once a year.    No orders of the defined types were placed in this encounter.   The patient has a good understanding of the overall plan. she agrees with it. she will call with any problems that may develop before the next visit here.   Rulon Eisenmenger, MD

## 2015-08-24 NOTE — Assessment & Plan Note (Signed)
Right breast invasive lobular carcinoma diagnosed July 1 2014status post lumpectomy 08/01/2013, 1.8 cm ILC, grade 1, 0/2 sentinel nodes, ER/PR positive, HER-2 negative, Ki-67 11%, T1 cN0 stage IA status post radiation by Dr. Orlene Erm in [place], on Arimidex 1 mg daily since 11/10/2013, patient could not come back to see [place] and was taking it every other day.  Recommendation: 1. Resume Arimidex 1 mg daily 2. Breast cancer surveillance with breast exam 08/24/2015 is normal 3. Mammograms to be done annually, patient gets it done at [facility]. I discussed the importance of doing 3-D mammograms.  Return to clinic in 6 months for follow-up after that we could see her once a year.

## 2015-10-05 ENCOUNTER — Other Ambulatory Visit: Payer: Self-pay

## 2015-10-05 ENCOUNTER — Telehealth: Payer: Self-pay | Admitting: *Deleted

## 2015-10-05 ENCOUNTER — Other Ambulatory Visit: Payer: Self-pay | Admitting: *Deleted

## 2015-10-05 DIAGNOSIS — C50911 Malignant neoplasm of unspecified site of right female breast: Secondary | ICD-10-CM

## 2015-10-05 NOTE — Telephone Encounter (Signed)
Faxed order for 3d mammo to [facility] in [place].

## 2015-11-08 ENCOUNTER — Ambulatory Visit
Admission: RE | Admit: 2015-11-08 | Discharge: 2015-11-08 | Disposition: A | Discharge status: Home / Self Care | Payer: Medicare Other | Source: Ambulatory Visit | Attending: Hematology and Oncology | Admitting: Hematology and Oncology

## 2015-11-08 DIAGNOSIS — C50911 Malignant neoplasm of unspecified site of right female breast: Secondary | ICD-10-CM

## 2016-02-07 ENCOUNTER — Telehealth: Payer: Self-pay | Admitting: Hematology and Oncology

## 2016-02-07 NOTE — Telephone Encounter (Signed)
Due to call day moved 3/28 f/u to 11:45 am. Left message for patient and mailed schedule.

## 2016-02-08 ENCOUNTER — Ambulatory Visit: Payer: Self-pay | Admitting: General Surgery

## 2016-02-22 ENCOUNTER — Ambulatory Visit (HOSPITAL_BASED_OUTPATIENT_CLINIC_OR_DEPARTMENT_OTHER): Payer: Medicare Other | Admitting: Hematology and Oncology

## 2016-02-22 ENCOUNTER — Encounter: Payer: Self-pay | Admitting: Hematology and Oncology

## 2016-02-22 ENCOUNTER — Telehealth: Payer: Self-pay | Admitting: Hematology and Oncology

## 2016-02-22 VITALS — BP 154/88 | HR 67 | Temp 98.0°F | Resp 18 | Ht 63.0 in | Wt 206.7 lb

## 2016-02-22 DIAGNOSIS — Z79811 Long term (current) use of aromatase inhibitors: Secondary | ICD-10-CM | POA: Diagnosis not present

## 2016-02-22 DIAGNOSIS — C50511 Malignant neoplasm of lower-outer quadrant of right female breast: Secondary | ICD-10-CM | POA: Diagnosis not present

## 2016-02-22 DIAGNOSIS — Z170 Estrogen receptor positive status [ER+]: Secondary | ICD-10-CM | POA: Diagnosis not present

## 2016-02-22 NOTE — Progress Notes (Signed)
Unable to get in to exam room prior to MD.  No assessment performed.  

## 2016-02-22 NOTE — Progress Notes (Signed)
Patient Care Team: Michele Chroman, MD as PCP - General (Internal Medicine)  SUMMARY OF ONCOLOGIC HISTORY:   Breast cancer of lower-outer quadrant of right female breast ([place])   06/10/2013 Initial Diagnosis Invasive lobular cancer ER/PR positive HER-2 negative   08/01/2013 Surgery Right lumpectomy: Invasive lobular cancer 1.8 cm, grade 1, margins clear, 0/2 lymph nodes, ER positive, PR positive, HER-2 negative, Ki-67 11%, T1 cN0 stage IA   09/15/2013 - 10/31/2013 Radiation Therapy Adjuvant radiation therapy by Dr. Orlene Erm at [facility]   11/10/2013 -  Anti-estrogen oral therapy Anastrozole 1 mg daily 5 years    CHIEF COMPLIANT: follow-up on anastrozole therapy.  INTERVAL HISTORY: Michele Palmer is a 80 year old with above-mentioned history of right breast cancer who is currently on anastrozole therapy and appears to be tolerating it fairly well. She has very mild hot flashes and musculoskeletal stiffness which gets better with activity. Otherwise she is tolerating it fairly well. Denies any lumps or nodules in the breasts. Patient does feel that she will need surgery on abdominal ventral hernia.  REVIEW OF SYSTEMS:   Constitutional: Denies fevers, chills or abnormal weight loss Eyes: Denies blurriness of vision Ears, nose, mouth, throat, and face: Denies mucositis or sore throat Respiratory: Denies cough, dyspnea or wheezes Cardiovascular: Denies palpitation, chest discomfort Gastrointestinal: ventral hernia Skin: Denies abnormal skin rashes Lymphatics: Denies new lymphadenopathy or easy bruising Neurological:Denies numbness, tingling or new weaknesses Behavioral/Psych: Mood is stable, no new changes  Extremities: No lower extremity edema Breast:  denies any pain or lumps or nodules in either breasts All other systems were reviewed with the patient and are negative.  I have reviewed the past medical history, past surgical history, social history and family history with the patient and they are  unchanged from previous note.  ALLERGIES:  is allergic to aspirin; ciprofloxacin; codeine; and sulfur.  MEDICATIONS:  Current Outpatient Prescriptions  Medication Sig Dispense Refill  . acetaminophen (TYLENOL) 500 MG tablet Take 500-1,000 mg by mouth every 6 (six) hours as needed (pain).    Marland Kitchen ALPRAZolam (XANAX) 0.25 MG tablet Take 0.125 mg by mouth at bedtime as needed for sleep.     Marland Kitchen anastrozole (ARIMIDEX) 1 MG tablet Take 1 tablet (1 mg total) by mouth daily. 90 tablet 3  . citalopram (CELEXA) 10 MG tablet Patient taking 78m    . losartan (COZAAR) 100 MG tablet Take 100 mg by mouth daily.    . Multiple Vitamin (MULTIVITAMIN WITH MINERALS) TABS tablet Take 1 tablet by mouth daily. 60 tablet 0  . ranitidine (ZANTAC) 150 MG tablet Take 150 mg by mouth 2 (two) times daily as needed (acid reflux).      No current facility-administered medications for this visit.    PHYSICAL EXAMINATION: ECOG PERFORMANCE STATUS: 1 - Symptomatic but completely ambulatory  Filed Vitals:   02/22/16 1159  BP: 154/88  Pulse: 67  Temp: 98 F (36.7 C)  Resp: 18   Filed Weights   02/22/16 1159  Weight: 206 lb 11.2 oz (93.759 kg)    GENERAL:alert, no distress and comfortable SKIN: skin color, texture, turgor are normal, no rashes or significant lesions EYES: normal, Conjunctiva are pink and non-injected, sclera clear OROPHARYNX:no exudate, no erythema and lips, buccal mucosa, and tongue normal  NECK: supple, thyroid normal size, non-tender, without nodularity LYMPH:  no palpable lymphadenopathy in the cervical, axillary or inguinal LUNGS: clear to auscultation and percussion with normal breathing effort HEART: regular rate & rhythm and no murmurs and no lower  extremity edema ABDOMEN:abdomen soft, non-tender and normal bowel sounds, ventral hernia no hepatosplenomegaly MUSCULOSKELETAL:no cyanosis of digits and no clubbing  NEURO: alert & oriented x 3 with fluent speech, no focal motor/sensory  deficits EXTREMITIES: No lower extremity edema BREAST: No palpable masses or nodules in either right or left breasts. No palpable axillary supraclavicular or infraclavicular adenopathy no breast tenderness or nipple discharge. (exam performed in the presence of a chaperone)  LABORATORY DATA:  I have reviewed the data as listed   Chemistry      Component Value Date/Time   NA 140 08/24/2015 1338   NA 134* 07/14/2015 0524   K 4.5 08/24/2015 1338   K 3.9 07/14/2015 0524   CL 101 07/14/2015 0524   CO2 28 08/24/2015 1338   CO2 28 07/14/2015 0524   BUN 23.4 08/24/2015 1338   BUN 13 07/14/2015 0524   CREATININE 0.8 08/24/2015 1338   CREATININE 0.71 07/14/2015 0524      Component Value Date/Time   CALCIUM 9.6 08/24/2015 1338   CALCIUM 9.2 07/14/2015 0524   ALKPHOS 110 08/24/2015 1338   ALKPHOS 99 07/09/2015 1122   AST 15 08/24/2015 1338   AST 18 07/09/2015 1122   ALT 14 08/24/2015 1338   ALT 16 07/09/2015 1122   BILITOT 0.37 08/24/2015 1338   BILITOT 0.4 07/09/2015 1122       Lab Results  Component Value Date   WBC 7.9 08/24/2015   HGB 12.9 08/24/2015   HCT 39.0 08/24/2015   MCV 91.9 08/24/2015   PLT 236 08/24/2015   NEUTROABS 4.6 08/24/2015     ASSESSMENT & PLAN:  Breast cancer of lower-outer quadrant of right female breast Right breast invasive lobular carcinoma diagnosed July 1 2014status post lumpectomy 08/01/2013, 1.8 cm ILC, grade 1, 0/2 sentinel nodes, ER/PR positive, HER-2 negative, Ki-67 11%, T1 cN0 stage IA status post radiation by Dr. Orlene Erm in [name], on Arimidex 1 mg daily since 11/10/2013, patient could not come back to see [place] and was taking it every other day. Full dose resumed 08/24/2015  Breast cancer surveillance: 1. Breast exam 02/22/2016 is normal 3. Mammograms to be done annually,December 2016: Normal with breast density category B  Anastrozole toxicities: 1. Occasional hot flashes 2. Mild to moderate muscle stiffness  Otherwise patient appears to  be tolerating this Very well. Return to clinic in 1 year.  No orders of the defined types were placed in this encounter.   The patient has a good understanding of the overall plan. she agrees with it. she will call with any problems that may develop before the next visit here.   Rulon Eisenmenger, MD 02/22/2016

## 2016-02-22 NOTE — Assessment & Plan Note (Signed)
Right breast invasive lobular carcinoma diagnosed July 1 2014status post lumpectomy 08/01/2013, 1.8 cm ILC, grade 1, 0/2 sentinel nodes, ER/PR positive, HER-2 negative, Ki-67 11%, T1 cN0 stage IA status post radiation by Dr. Orlene Erm in [place], on Arimidex 1 mg daily since 11/10/2013, patient could not come back to see [place] and was taking it every other day. Full dose resumed 08/24/2015  Breast cancer surveillance: 1. Breast exam 02/22/2016 is normal 3. Mammograms to be done annually, patient gets it done at [facility]. I discussed the importance of doing 3-D mammograms.  Return to clinic in 1 year.

## 2016-02-22 NOTE — Telephone Encounter (Signed)
Gave and prnted appt sched and avs for pt for March 2018

## 2016-03-13 ENCOUNTER — Encounter (HOSPITAL_COMMUNITY): Payer: Self-pay

## 2016-03-13 ENCOUNTER — Encounter (HOSPITAL_COMMUNITY)
Admission: RE | Admit: 2016-03-13 | Discharge: 2016-03-13 | Disposition: A | Discharge status: Home / Self Care | Payer: Medicare Other | Source: Ambulatory Visit | Attending: General Surgery | Admitting: General Surgery

## 2016-03-13 DIAGNOSIS — K432 Incisional hernia without obstruction or gangrene: Secondary | ICD-10-CM | POA: Insufficient documentation

## 2016-03-13 DIAGNOSIS — Z0183 Encounter for blood typing: Secondary | ICD-10-CM | POA: Insufficient documentation

## 2016-03-13 DIAGNOSIS — Z01812 Encounter for preprocedural laboratory examination: Secondary | ICD-10-CM | POA: Diagnosis not present

## 2016-03-13 DIAGNOSIS — Z01818 Encounter for other preprocedural examination: Secondary | ICD-10-CM | POA: Insufficient documentation

## 2016-03-13 LAB — CBC WITH DIFFERENTIAL/PLATELET
BASOS PCT: 1 %
Basophils Absolute: 0 10*3/uL (ref 0.0–0.1)
EOS ABS: 0.1 10*3/uL (ref 0.0–0.7)
EOS PCT: 1 %
HCT: 40 % (ref 36.0–46.0)
Hemoglobin: 12.7 g/dL (ref 12.0–15.0)
Lymphocytes Relative: 30 %
Lymphs Abs: 2.2 10*3/uL (ref 0.7–4.0)
MCH: 30.2 pg (ref 26.0–34.0)
MCHC: 31.8 g/dL (ref 30.0–36.0)
MCV: 95 fL (ref 78.0–100.0)
MONO ABS: 0.8 10*3/uL (ref 0.1–1.0)
MONOS PCT: 11 %
Neutro Abs: 4.3 10*3/uL (ref 1.7–7.7)
Neutrophils Relative %: 57 %
PLATELETS: 195 10*3/uL (ref 150–400)
RBC: 4.21 MIL/uL (ref 3.87–5.11)
RDW: 12.9 % (ref 11.5–15.5)
WBC: 7.5 10*3/uL (ref 4.0–10.5)

## 2016-03-13 LAB — COMPREHENSIVE METABOLIC PANEL
ALBUMIN: 3.6 g/dL (ref 3.5–5.0)
ALT: 15 U/L (ref 14–54)
ANION GAP: 10 (ref 5–15)
AST: 17 U/L (ref 15–41)
Alkaline Phosphatase: 93 U/L (ref 38–126)
BILIRUBIN TOTAL: 0.7 mg/dL (ref 0.3–1.2)
BUN: 21 mg/dL — ABNORMAL HIGH (ref 6–20)
CALCIUM: 9.5 mg/dL (ref 8.9–10.3)
CHLORIDE: 102 mmol/L (ref 101–111)
CO2: 27 mmol/L (ref 22–32)
Creatinine, Ser: 0.74 mg/dL (ref 0.44–1.00)
GFR calc Af Amer: >60 mL/min (ref >60)
GFR calc non Af Amer: >60 mL/min (ref >60)
GLUCOSE: 121 mg/dL — AB (ref 65–99)
POTASSIUM: 4.2 mmol/L (ref 3.5–5.1)
SODIUM: 139 mmol/L (ref 135–145)
Total Protein: 6.9 g/dL (ref 6.5–8.1)

## 2016-03-13 LAB — TYPE AND SCREEN
ABO/RH(D): A POS
ANTIBODY SCREEN: NEGATIVE

## 2016-03-13 NOTE — Pre-Procedure Instructions (Addendum)
Michele Palmer  03/13/2016      [facility] - [place], [place] - [place] [address] Phone: [phone] Fax: [phone]    Your procedure is scheduled on April 24.  Report to [facility] Admitting at 700 A.M.  Call this number if you have problems the morning of surgery:  [phone]   Remember:  Do not eat food or drink liquids after midnight.  Take these medicines the morning of surgery with A SIP OF WATER tylenol if needed, ranitidine (Zantac) if needed Stop taking aspirin, Ibuprofen, Advil, Motrin, BC's, Goody's, herbal medications, Aleve  Do not wear jewelry, make-up or nail polish.  Do not wear lotions, powders, or perfumes.  You may wear deodorant.  Do not shave 48 hours prior to surgery.  Men may shave face and neck.  Do not bring valuables to the hospital.  [facility] is not responsible for any belongings or valuables.  Contacts, dentures or bridgework may not be worn into surgery.  Leave your suitcase in the car.  After surgery it may be brought to your room.  For patients admitted to the hospital, discharge time will be determined by your treatment team.  Patients discharged the day of surgery will not be allowed to drive home.    Special instructions:  [facility] - Preparing for Surgery  Before surgery, you can play an important role.  Because skin is not sterile, your skin needs to be as free of germs as possible.  You can reduce the number of germs on you skin by washing with CHG (chlorahexidine gluconate) soap before surgery.  CHG is an antiseptic cleaner which kills germs and bonds with the skin to continue killing germs even after washing.  Please DO NOT use if you have an allergy to CHG or antibacterial soaps.  If your skin becomes reddened/irritated stop using the CHG and inform your nurse when you arrive at Short Stay.  Do not shave (including legs and underarms) for at least 48 hours prior to the first CHG shower.   You may shave your face.  Please follow these instructions carefully:   1.  Shower with CHG Soap the night before surgery and the                                morning of Surgery.  2.  If you choose to wash your hair, wash your hair first as usual with your       normal shampoo.  3.  After you shampoo, rinse your hair and body thoroughly to remove the                      Shampoo.  4.  Use CHG as you would any other liquid soap.  You can apply chg directly       to the skin and wash gently with scrungie or a clean washcloth.  5.  Apply the CHG Soap to your body ONLY FROM THE NECK DOWN.        Do not use on open wounds or open sores.  Avoid contact with your eyes,       ears, mouth and genitals (private parts).  Wash genitals (private parts)       with your normal soap.  6.  Wash thoroughly, paying special attention to the area where your surgery  will be performed.  7.  Thoroughly rinse your body with warm water from the neck down.  8.  DO NOT shower/wash with your normal soap after using and rinsing off       the CHG Soap.  9.  Pat yourself dry with a clean towel.            10.  Wear clean pajamas.            11.  Place clean sheets on your bed the night of your first shower and do not        sleep with pets.  Day of Surgery  Do not apply any lotions/deoderants the morning of surgery.  Please wear clean clothes to the hospital/surgery center.     Please read over the following fact sheets that you were given. Pain Booklet, Coughing and Deep Breathing and Surgical Site Infection Prevention

## 2016-03-13 NOTE — Progress Notes (Addendum)
PCP is Dr. Harless Litten Denies ever seeing a cardiologist. Denies ever having a card cath. States she may have had a stress test and echo, but it was many years ago. CXR noted from 07-12-15 Denies andy chest pain.

## 2016-03-14 LAB — HEMOGLOBIN A1C
HEMOGLOBIN A1C: 6.2 % — AB (ref 4.8–5.6)
MEAN PLASMA GLUCOSE: 131 mg/dL

## 2016-03-19 MED ORDER — DEXTROSE 5 % IV SOLN
2.0000 g | INTRAVENOUS | Status: DC
  Filled 2016-03-19: qty 20

## 2016-03-19 NOTE — H&P (Signed)
  [facility] 02/08/2016 11:52 AM Location: [facility] Patient #: [id number] DOB: 1936/11/19 Married / Language: English / Race: White Female   The patient is a 80 year old female.  Allergies Elbert Ewings, CMA; 02/08/2016 11:52 AM) Aspirin *ANALGESICS - NonNarcotic* Ciprofloxacin *CHEMICALS* Codeine Phosphate *ANALGESICS - OPIOID* Sulfur (Antiseborrheic) *DERMATOLOGICALS*  Medication History Elbert Ewings, CMA; 02/08/2016 11:53 AM) Abdominal Binder/Elastic Large (1 (one) Misc as needed, Taken starting 08/03/2015) Active. Anastrozole (1MG  Tablet, Oral) Active. Citalopram Hydrobromide (20MG  Tablet, Oral) Active. Dicyclomine HCl (10MG  Capsule, Oral) Active. Losartan Potassium (100MG  Tablet, Oral) Active. Ranitidine HCl (150MG  Tablet, Oral) Active. Florastor (250MG  Capsule, Oral) Active. Medications Reconciled  Vitals Elbert Ewings CMA; 02/08/2016 11:53 AM) 02/08/2016 11:53 AM Weight: 204 lb Height: 62.5in Body Surface Area: 1.94 m Body Mass Index: 36.72 kg/m  Temp.: 97.29F  Pulse: 85 (Regular)  BP: 130/78 (Sitting, Left Arm, Standard) Latest vital signs: P 66   BP177/71    Physical Exam (Dell Hurtubise O. Elisabel Hanover MD; 02/08/2016 12:09 PM) Abdomen Note: Large hernia defects, The upper midline one is the most symptomatic, but both bother her. Left sided ventral hernia at previous ostomy site is the other hernia that will need repair Lungs:  Clear to auscultation Cor.  RRR.  No murmurs  Assessment & Plan Jeneen Rinks O. Khalen Styer MD; 02/08/2016 12:11 PM) HERNIA, INCISIONAL (K43.2) Story: Now has upper midline ventral hernia in addition to the left colostomy site hernia Impression: Large ventral/incisional hernias, both defects > 10 cm. Will need either one large piece of mesh to repair or tow large separate repairs at the same time. INCISIONAL HERNIA, WITHOUT OBSTRUCTION OR GANGRENE (K43.2) INCISIONAL HERNIA, WITHOUT OBSTRUCTION OR GANGRENE (K43.2) Current  Plans You are being scheduled for surgery - Our schedulers will call you.  You should hear from our office's scheduling department within 5 working days about the location, date, and time of surgery. We try to make accommodations for patient's preferences in scheduling surgery, but sometimes the OR schedule or the surgeon's schedule prevents [place] from making those accommodations.  If you have not heard from our office [phone]) in 5 working days, call the office and ask for your surgeon's nurse.  If you have other questions about your diagnosis, plan, or surgery, call the office and ask for your surgeon's nurse.  Pt Education - Pamphlet Given - Hernia Surgery: discussed with patient and provided information.  Addendum:  Doing well.  No blood thinners.  Patient and family ready for the repair. [name]. [name], MD, [place] [phone] [facility]

## 2016-03-20 ENCOUNTER — Encounter (HOSPITAL_COMMUNITY)
Admission: RE | Disposition: A | Discharge status: Home Health | Payer: Self-pay | Source: Ambulatory Visit | Attending: General Surgery

## 2016-03-20 ENCOUNTER — Encounter (HOSPITAL_COMMUNITY): Payer: Self-pay | Admitting: *Deleted

## 2016-03-20 ENCOUNTER — Inpatient Hospital Stay (HOSPITAL_COMMUNITY): Payer: Medicare Other | Admitting: Anesthesiology

## 2016-03-20 ENCOUNTER — Inpatient Hospital Stay (HOSPITAL_COMMUNITY)
Admission: RE | Admit: 2016-03-20 | Discharge: 2016-03-24 | DRG: 331 | Disposition: A | Discharge status: Home Health | Payer: Medicare Other | Source: Ambulatory Visit | Attending: General Surgery | Admitting: General Surgery

## 2016-03-20 DIAGNOSIS — K660 Peritoneal adhesions (postprocedural) (postinfection): Secondary | ICD-10-CM | POA: Diagnosis present

## 2016-03-20 DIAGNOSIS — I739 Peripheral vascular disease, unspecified: Secondary | ICD-10-CM | POA: Diagnosis present

## 2016-03-20 DIAGNOSIS — K219 Gastro-esophageal reflux disease without esophagitis: Secondary | ICD-10-CM | POA: Diagnosis present

## 2016-03-20 DIAGNOSIS — Z6836 Body mass index (BMI) 36.0-36.9, adult: Secondary | ICD-10-CM

## 2016-03-20 DIAGNOSIS — Z886 Allergy status to analgesic agent status: Secondary | ICD-10-CM | POA: Diagnosis not present

## 2016-03-20 DIAGNOSIS — E669 Obesity, unspecified: Secondary | ICD-10-CM | POA: Diagnosis present

## 2016-03-20 DIAGNOSIS — R197 Diarrhea, unspecified: Secondary | ICD-10-CM | POA: Diagnosis not present

## 2016-03-20 DIAGNOSIS — Z882 Allergy status to sulfonamides status: Secondary | ICD-10-CM

## 2016-03-20 DIAGNOSIS — K439 Ventral hernia without obstruction or gangrene: Secondary | ICD-10-CM | POA: Diagnosis present

## 2016-03-20 DIAGNOSIS — F419 Anxiety disorder, unspecified: Secondary | ICD-10-CM | POA: Diagnosis present

## 2016-03-20 DIAGNOSIS — Z881 Allergy status to other antibiotic agents status: Secondary | ICD-10-CM

## 2016-03-20 DIAGNOSIS — Z79899 Other long term (current) drug therapy: Secondary | ICD-10-CM | POA: Diagnosis not present

## 2016-03-20 DIAGNOSIS — E119 Type 2 diabetes mellitus without complications: Secondary | ICD-10-CM | POA: Diagnosis present

## 2016-03-20 DIAGNOSIS — I10 Essential (primary) hypertension: Secondary | ICD-10-CM | POA: Diagnosis present

## 2016-03-20 DIAGNOSIS — F329 Major depressive disorder, single episode, unspecified: Secondary | ICD-10-CM | POA: Diagnosis present

## 2016-03-20 DIAGNOSIS — M1990 Unspecified osteoarthritis, unspecified site: Secondary | ICD-10-CM | POA: Diagnosis present

## 2016-03-20 DIAGNOSIS — K432 Incisional hernia without obstruction or gangrene: Secondary | ICD-10-CM | POA: Diagnosis present

## 2016-03-20 DIAGNOSIS — R109 Unspecified abdominal pain: Secondary | ICD-10-CM | POA: Diagnosis present

## 2016-03-20 HISTORY — PX: INSERTION OF MESH: SHX5868

## 2016-03-20 HISTORY — PX: VENTRAL HERNIA REPAIR: SHX424

## 2016-03-20 LAB — GLUCOSE, CAPILLARY
GLUCOSE-CAPILLARY: 104 mg/dL — AB (ref 65–99)
GLUCOSE-CAPILLARY: 137 mg/dL — AB (ref 65–99)

## 2016-03-20 SURGERY — REPAIR, HERNIA, VENTRAL
Anesthesia: General | Site: Abdomen

## 2016-03-20 MED ORDER — OXYCODONE HCL 5 MG/5ML PO SOLN: 5.0000 mg | Freq: Once | ORAL | Status: DC | PRN

## 2016-03-20 MED ORDER — LACTATED RINGERS IV SOLN
INTRAVENOUS | Status: DC
  Administered 2016-03-20 (×2): via INTRAVENOUS

## 2016-03-20 MED ORDER — DEXAMETHASONE SODIUM PHOSPHATE 4 MG/ML IJ SOLN
INTRAMUSCULAR | Status: DC | PRN
  Administered 2016-03-20: 4 mg via INTRAVENOUS

## 2016-03-20 MED ORDER — ROCURONIUM BROMIDE 100 MG/10ML IV SOLN
INTRAVENOUS | Status: DC | PRN
  Administered 2016-03-20: 50 mg via INTRAVENOUS
  Administered 2016-03-20: 10 mg via INTRAVENOUS

## 2016-03-20 MED ORDER — LIDOCAINE HCL (CARDIAC) 20 MG/ML IV SOLN
INTRAVENOUS | Status: AC
  Filled 2016-03-20: qty 5

## 2016-03-20 MED ORDER — ONDANSETRON 4 MG PO TBDP
4.0000 mg | ORAL_TABLET | Freq: Four times a day (QID) | ORAL | Status: DC | PRN
  Administered 2016-03-21: 4 mg via ORAL
  Filled 2016-03-20: qty 1

## 2016-03-20 MED ORDER — FENTANYL CITRATE (PF) 100 MCG/2ML IJ SOLN
INTRAMUSCULAR | Status: DC | PRN
  Administered 2016-03-20 (×2): 50 ug via INTRAVENOUS
  Administered 2016-03-20: 100 ug via INTRAVENOUS
  Administered 2016-03-20 (×3): 50 ug via INTRAVENOUS

## 2016-03-20 MED ORDER — ONDANSETRON HCL 4 MG/2ML IJ SOLN
INTRAMUSCULAR | Status: AC
  Filled 2016-03-20: qty 2

## 2016-03-20 MED ORDER — POTASSIUM CHLORIDE IN NACL 20-0.9 MEQ/L-% IV SOLN
INTRAVENOUS | Status: DC
  Administered 2016-03-20 – 2016-03-22 (×4): via INTRAVENOUS
  Filled 2016-03-20 (×5): qty 1000

## 2016-03-20 MED ORDER — ADULT MULTIVITAMIN W/MINERALS CH
1.0000 | ORAL_TABLET | Freq: Every day | ORAL | Status: DC
  Administered 2016-03-20 – 2016-03-24 (×5): 1 via ORAL
  Filled 2016-03-20 (×5): qty 1

## 2016-03-20 MED ORDER — FENTANYL CITRATE (PF) 250 MCG/5ML IJ SOLN
INTRAMUSCULAR | Status: AC
  Filled 2016-03-20: qty 5

## 2016-03-20 MED ORDER — ANASTROZOLE 1 MG PO TABS
1.0000 mg | ORAL_TABLET | Freq: Every day | ORAL | Status: DC
  Administered 2016-03-20 – 2016-03-24 (×5): 1 mg via ORAL
  Filled 2016-03-20 (×5): qty 1

## 2016-03-20 MED ORDER — PHENYLEPHRINE 40 MCG/ML (10ML) SYRINGE FOR IV PUSH (FOR BLOOD PRESSURE SUPPORT)
PREFILLED_SYRINGE | INTRAVENOUS | Status: AC
  Filled 2016-03-20: qty 10

## 2016-03-20 MED ORDER — POVIDONE-IODINE 10 % EX OINT
TOPICAL_OINTMENT | CUTANEOUS | Status: AC
  Filled 2016-03-20: qty 28.35

## 2016-03-20 MED ORDER — ONDANSETRON HCL 4 MG/2ML IJ SOLN
INTRAMUSCULAR | Status: DC | PRN
  Administered 2016-03-20: 4 mg via INTRAVENOUS

## 2016-03-20 MED ORDER — OXYCODONE HCL 5 MG PO TABS: 5.0000 mg | ORAL_TABLET | Freq: Once | ORAL | Status: DC | PRN

## 2016-03-20 MED ORDER — METHOCARBAMOL 500 MG PO TABS
500.0000 mg | ORAL_TABLET | Freq: Four times a day (QID) | ORAL | Status: DC | PRN
  Administered 2016-03-20 – 2016-03-21 (×2): 500 mg via ORAL
  Filled 2016-03-20 (×2): qty 1

## 2016-03-20 MED ORDER — LIDOCAINE HCL (CARDIAC) 20 MG/ML IV SOLN
INTRAVENOUS | Status: DC | PRN
  Administered 2016-03-20: 80 mg via INTRATRACHEAL
  Administered 2016-03-20: 70 mg via INTRAVENOUS

## 2016-03-20 MED ORDER — HYDROMORPHONE HCL 1 MG/ML IJ SOLN
INTRAMUSCULAR | Status: AC
  Filled 2016-03-20: qty 1

## 2016-03-20 MED ORDER — ONDANSETRON HCL 4 MG/2ML IJ SOLN: 4.0000 mg | Freq: Four times a day (QID) | INTRAMUSCULAR | Status: DC | PRN

## 2016-03-20 MED ORDER — SODIUM CHLORIDE 0.9 % IR SOLN
Status: DC | PRN
  Administered 2016-03-20: 500 mL

## 2016-03-20 MED ORDER — CHLORHEXIDINE GLUCONATE 4 % EX LIQD: 1.0000 "application " | Freq: Once | CUTANEOUS | Status: DC

## 2016-03-20 MED ORDER — ALPRAZOLAM 0.25 MG PO TABS
0.1250 mg | ORAL_TABLET | Freq: Every evening | ORAL | Status: DC | PRN
  Administered 2016-03-20 – 2016-03-22 (×3): 0.125 mg via ORAL
  Filled 2016-03-20 (×3): qty 1

## 2016-03-20 MED ORDER — SUGAMMADEX SODIUM 200 MG/2ML IV SOLN
INTRAVENOUS | Status: AC
  Filled 2016-03-20: qty 2

## 2016-03-20 MED ORDER — BISACODYL 10 MG RE SUPP: 10.0000 mg | Freq: Every day | RECTAL | Status: DC | PRN

## 2016-03-20 MED ORDER — HYDROMORPHONE HCL 1 MG/ML IJ SOLN
0.2500 mg | INTRAMUSCULAR | Status: DC | PRN
  Administered 2016-03-20 (×2): 0.5 mg via INTRAVENOUS

## 2016-03-20 MED ORDER — DOCUSATE SODIUM 100 MG PO CAPS
100.0000 mg | ORAL_CAPSULE | Freq: Two times a day (BID) | ORAL | Status: DC
  Administered 2016-03-20 – 2016-03-22 (×4): 100 mg via ORAL
  Filled 2016-03-20 (×5): qty 1

## 2016-03-20 MED ORDER — CEFAZOLIN SODIUM 1-5 GM-% IV SOLN
1.0000 g | Freq: Three times a day (TID) | INTRAVENOUS | Status: AC
  Administered 2016-03-20 – 2016-03-21 (×3): 1 g via INTRAVENOUS
  Filled 2016-03-20 (×3): qty 50

## 2016-03-20 MED ORDER — ENOXAPARIN SODIUM 40 MG/0.4ML ~~LOC~~ SOLN
40.0000 mg | SUBCUTANEOUS | Status: DC
  Administered 2016-03-21 – 2016-03-24 (×4): 40 mg via SUBCUTANEOUS
  Filled 2016-03-20 (×4): qty 0.4

## 2016-03-20 MED ORDER — DEXAMETHASONE SODIUM PHOSPHATE 4 MG/ML IJ SOLN
INTRAMUSCULAR | Status: AC
  Filled 2016-03-20: qty 1

## 2016-03-20 MED ORDER — ROCURONIUM BROMIDE 50 MG/5ML IV SOLN
INTRAVENOUS | Status: AC
  Filled 2016-03-20: qty 1

## 2016-03-20 MED ORDER — CEFAZOLIN SODIUM-DEXTROSE 2-4 GM/100ML-% IV SOLN
INTRAVENOUS | Status: AC
  Administered 2016-03-20: 2 g via INTRAVENOUS
  Filled 2016-03-20: qty 100

## 2016-03-20 MED ORDER — HYDROMORPHONE HCL 1 MG/ML IJ SOLN
0.5000 mg | INTRAMUSCULAR | Status: DC | PRN
  Administered 2016-03-20 (×2): 1 mg via INTRAVENOUS
  Administered 2016-03-21: 0.5 mg via INTRAVENOUS
  Administered 2016-03-21 (×3): 1 mg via INTRAVENOUS
  Filled 2016-03-20 (×6): qty 1

## 2016-03-20 MED ORDER — SUGAMMADEX SODIUM 200 MG/2ML IV SOLN
INTRAVENOUS | Status: DC | PRN
  Administered 2016-03-20: 200 mg via INTRAVENOUS

## 2016-03-20 MED ORDER — PROPOFOL 10 MG/ML IV BOLUS
INTRAVENOUS | Status: DC | PRN
  Administered 2016-03-20: 170 mg via INTRAVENOUS
  Administered 2016-03-20: 30 mg via INTRAVENOUS

## 2016-03-20 MED ORDER — PROPOFOL 10 MG/ML IV BOLUS
INTRAVENOUS | Status: AC
  Filled 2016-03-20: qty 20

## 2016-03-20 MED ORDER — FAMOTIDINE 20 MG PO TABS
20.0000 mg | ORAL_TABLET | Freq: Every day | ORAL | Status: DC
  Administered 2016-03-20 – 2016-03-24 (×5): 20 mg via ORAL
  Filled 2016-03-20 (×5): qty 1

## 2016-03-20 MED ORDER — LOSARTAN POTASSIUM 50 MG PO TABS
100.0000 mg | ORAL_TABLET | Freq: Every day | ORAL | Status: DC
  Administered 2016-03-20 – 2016-03-24 (×5): 100 mg via ORAL
  Filled 2016-03-20 (×5): qty 2

## 2016-03-20 MED ORDER — 0.9 % SODIUM CHLORIDE (POUR BTL) OPTIME
TOPICAL | Status: DC | PRN
  Administered 2016-03-20: 1000 mL

## 2016-03-20 MED ORDER — POVIDONE-IODINE 10 % EX OINT
TOPICAL_OINTMENT | CUTANEOUS | Status: DC | PRN
  Administered 2016-03-20: 1 via TOPICAL

## 2016-03-20 SURGICAL SUPPLY — 58 items
BAG DECANTER FOR FLEXI CONT (MISCELLANEOUS) ×3 IMPLANT
BINDER ABD UNIV 12 45-62 (WOUND CARE) ×1 IMPLANT
BINDER ABDOMINAL 12 ML 46-62 (SOFTGOODS) ×3 IMPLANT
BINDER ABDOMINAL 46IN 62IN (WOUND CARE) ×3
BLADE SURG ROTATE 9660 (MISCELLANEOUS) IMPLANT
CANISTER SUCTION 2500CC (MISCELLANEOUS) ×3 IMPLANT
CHLORAPREP W/TINT 26ML (MISCELLANEOUS) ×3 IMPLANT
COVER SURGICAL LIGHT HANDLE (MISCELLANEOUS) ×3 IMPLANT
DRAIN CHANNEL 19F RND (DRAIN) ×6 IMPLANT
DRAPE LAPAROSCOPIC ABDOMINAL (DRAPES) ×3 IMPLANT
DRAPE UTILITY XL STRL (DRAPES) ×6 IMPLANT
DRSG OPSITE POSTOP 4X10 (GAUZE/BANDAGES/DRESSINGS) ×3 IMPLANT
DRSG TEGADERM 4X4.75 (GAUZE/BANDAGES/DRESSINGS) ×3 IMPLANT
ELECT CAUTERY BLADE 6.4 (BLADE) ×3 IMPLANT
ELECT REM PT RETURN 9FT ADLT (ELECTROSURGICAL) ×3
ELECTRODE REM PT RTRN 9FT ADLT (ELECTROSURGICAL) ×1 IMPLANT
EVACUATOR SILICONE 100CC (DRAIN) ×6 IMPLANT
GAUZE SPONGE 4X4 12PLY STRL (GAUZE/BANDAGES/DRESSINGS) ×3 IMPLANT
GLOVE BIO SURGEON STRL SZ7 (GLOVE) ×3 IMPLANT
GLOVE BIOGEL PI IND STRL 6.5 (GLOVE) ×1 IMPLANT
GLOVE BIOGEL PI IND STRL 7.0 (GLOVE) ×1 IMPLANT
GLOVE BIOGEL PI IND STRL 7.5 (GLOVE) ×1 IMPLANT
GLOVE BIOGEL PI IND STRL 8 (GLOVE) ×1 IMPLANT
GLOVE BIOGEL PI INDICATOR 6.5 (GLOVE) ×2
GLOVE BIOGEL PI INDICATOR 7.0 (GLOVE) ×2
GLOVE BIOGEL PI INDICATOR 7.5 (GLOVE) ×2
GLOVE BIOGEL PI INDICATOR 8 (GLOVE) ×2
GLOVE ECLIPSE 6.5 STRL STRAW (GLOVE) ×3 IMPLANT
GLOVE ECLIPSE 7.5 STRL STRAW (GLOVE) ×12 IMPLANT
GOWN STRL REUS W/ TWL LRG LVL3 (GOWN DISPOSABLE) ×3 IMPLANT
GOWN STRL REUS W/TWL LRG LVL3 (GOWN DISPOSABLE) ×6
KIT BASIN OR (CUSTOM PROCEDURE TRAY) ×3 IMPLANT
KIT ROOM TURNOVER OR (KITS) ×3 IMPLANT
MESH VENT ST 13.8X17.8CM L OVL (Mesh General) ×3 IMPLANT
NEEDLE HYPO 25GX1X1/2 BEV (NEEDLE) ×3 IMPLANT
NS IRRIG 1000ML POUR BTL (IV SOLUTION) ×3 IMPLANT
PACK GENERAL/GYN (CUSTOM PROCEDURE TRAY) ×3 IMPLANT
PAD ARMBOARD 7.5X6 YLW CONV (MISCELLANEOUS) ×3 IMPLANT
PEN SKIN MARKING BROAD (MISCELLANEOUS) ×3 IMPLANT
RETAINER VISCERA MED (MISCELLANEOUS) ×3 IMPLANT
STAPLER VISISTAT 35W (STAPLE) ×3 IMPLANT
SUT ETHIBOND NAB CTX #1 30IN (SUTURE) IMPLANT
SUT ETHILON 2 0 FS 18 (SUTURE) ×12 IMPLANT
SUT MNCRL AB 3-0 PS2 18 (SUTURE) IMPLANT
SUT NOVA 1 T20/GS 25DT (SUTURE) ×12 IMPLANT
SUT PROLENE 1 CT (SUTURE) IMPLANT
SUT SILK 2 0 (SUTURE) ×2
SUT SILK 2-0 18XBRD TIE 12 (SUTURE) ×1 IMPLANT
SUT SILK 3 0 SH CR/8 (SUTURE) ×3 IMPLANT
SUT VIC AB 2-0 CT1 27 (SUTURE) ×4
SUT VIC AB 2-0 CT1 TAPERPNT 27 (SUTURE) ×2 IMPLANT
SUT VIC AB 3-0 CT1 27 (SUTURE)
SUT VIC AB 3-0 CT1 TAPERPNT 27 (SUTURE) IMPLANT
SUT VICRYL AB 3 0 TIES (SUTURE) IMPLANT
SYR CONTROL 10ML LL (SYRINGE) IMPLANT
TOWEL OR 17X24 6PK STRL BLUE (TOWEL DISPOSABLE) ×3 IMPLANT
TOWEL OR 17X26 10 PK STRL BLUE (TOWEL DISPOSABLE) ×3 IMPLANT
TRAY FOLEY CATH 14FRSI W/METER (CATHETERS) ×3 IMPLANT

## 2016-03-20 NOTE — Care Management Note (Signed)
Case Management Note  Patient Details  Name: WAUNELL ARVIE MRN: [id number] Date of Birth: 06/07/1936  Subjective/Objective:                    Action/Plan:   Expected Discharge Date:                  Expected Discharge Plan:  Home/Self Care  In-House Referral:     Discharge planning Services     Post Acute Care Choice:    Choice offered to:     DME Arranged:    DME Agency:     HH Arranged:    HH Agency:     Status of Service:  In process, will continue to follow  Medicare Important Message Given:    Date Medicare IM Given:    Medicare IM give by:    Date Additional Medicare IM Given:    Additional Medicare Important Message give by:     If discussed at [place] of Stay Meetings, dates discussed:    Additional Comments:  [name], RN 03/20/2016, 3:26 PM

## 2016-03-20 NOTE — Transfer of Care (Signed)
Immediate Anesthesia Transfer of Care Note  Patient: Michele Palmer  Procedure(s) Performed: Procedure(s): VENTRAL INCISIONAL HERNIA X 2 OPEN REPAIR (N/A) INSERTION OF MESH (N/A)  Patient Location: PACU  Anesthesia Type:General  Level of Consciousness: awake, oriented and patient cooperative  Airway & Oxygen Therapy: Patient Spontanous Breathing and Patient connected to face mask oxygen  Post-op Assessment: Report given to RN and Post -op Vital signs reviewed and stable  Post vital signs: Reviewed  Last Vitals:  Filed Vitals:   03/20/16 0740  BP: 177/71  Pulse: 66  Temp: 36.7 C  Resp: 18    Complications: No apparent anesthesia complications

## 2016-03-20 NOTE — Anesthesia Procedure Notes (Signed)
Procedure Name: Intubation Date/Time: 03/20/2016 9:26 AM Performed by: Jenne Campus Pre-anesthesia Checklist: Patient identified, Emergency Drugs available, Suction available and Patient being monitored Patient Re-evaluated:Patient Re-evaluated prior to inductionOxygen Delivery Method: Circle System Utilized Preoxygenation: Pre-oxygenation with 100% oxygen Intubation Type: IV induction Ventilation: Mask ventilation without difficulty Laryngoscope Size: Miller and 2 Grade View: Grade I Tube type: Oral Tube size: 7.0 mm Number of attempts: 1 Airway Equipment and Method: Stylet and Oral airway Placement Confirmation: ETT inserted through vocal cords under direct vision,  positive ETCO2 and breath sounds checked- equal and bilateral Secured at: 21 cm Tube secured with: Tape Dental Injury: Teeth and Oropharynx as per pre-operative assessment

## 2016-03-20 NOTE — Op Note (Signed)
OPERATIVE REPORT  DATE OF OPERATION: 03/20/2016  PATIENT:  Michele Palmer  80 y.o. female  PRE-OPERATIVE DIAGNOSIS:  Symptomatic ventral/incisional hernias x 2, large  POST-OPERATIVE DIAGNOSIS:  Symptomatic ventral/incisional hernias, combined 17 x 19 cm  FINDINGS:  Large ventral hernia at left sided colostomy site and midline wound  PROCEDURE:  Procedure(s): VENTRAL INCISIONAL HERNIA X 2 OPEN REPAIR INSERTION OF MESH, 13.8 x 17.8 cm VENTRIO ST mesh  SURGEON:  Surgeon(s): Judeth Horn, MD  ASSISTANTLewis Moccasin, RNFA  ANESTHESIA:   general  COMPLICATIONS:  SB enterotomy, x 1 with minimal contamination  EBL: 150 ml  BLOOD ADMINISTERED: none  DRAINS: Urinary Catheter (Foley) and (2) Blake drain(s) in the Subcutaneous tissue bilaterally   SPECIMEN:  No Specimen  COUNTS CORRECT:  YES  PROCEDURE DETAILS: The patient was taken to the operating room and placed on the table in the supine position. After an adequate general endotracheal anesthetic was administered she was prepped and draped in usual sterile manner exposing her abdomen.  A proper timeout was performed identifying the patient and procedure to be performed. The patient had a large hernia over on the left side with chronically incarcerated small bowel and also in the midline wound.  We went through the midline incision and excising the excess of widen skin tissue. We dissected down into the hernia sac in the midline subsequent the open incision to fully down the midline.  The majority of the case as been taking down adhesions between small bowel and intra-abdominal wall and the fascia. Also we combine the hernias on the left side and also in a midline wound by taking out a small 1-1/2 cm bridge of fascia where the patient's colostomy had been previously.  Once were taken down all adhesions and we ran the small bowel from ligament of Treitz all the way down to the terminal ileum and found there to be no evidence of any small  bowel injury.  We measured the defect to be approximately 15 x 17 cm in size. We used a piece of coated mesh measuring 13.8 x 17.8 cm in size and attached it to the under lying part of the fascia using interrupted horizontal mattress sutures of #1 Novafil.  We started off with 4 equally space sutures and then divided those into, putting in addition stitches. At one point the mesh wass folded underneath itself and in an attempt to bring that mesh out for suturing a small bowel piece was hooked and a small tear in the small bowel was obtained. This was a 1-2 mm enterotomy that had a small amount of contained perforation right at the surface. That loop of small bowel was brought out after removing previously place of mesh sutures. It was repaired using interrupted 3-0 silk sutures. Those no longer leaking and there was really no contamination,  We subsequently completed the mesh after changing gloves.  The mesh was attached circumferentially to where there was no evidence of areas where the bowel could calm about the mesh. We irrigated with antibiotic solution then closed. Two 19 French Blake drains were placed in the subcutaneous tissue bilaterally and brought out the lower portion of the abdomen. Irrigated with antibiotic solution then closed the subcutaneous tissue on top of the drains using running 2-0 Vicryl sutures. The skin was closed using stainless steel staples. Dressing was applied all counts were correct including needles, sponges, and instruments.  PATIENT DISPOSITION:  PACU - hemodynamically stable.   Marliss Buttacavoli 4/24/201711:21 AM

## 2016-03-20 NOTE — Anesthesia Postprocedure Evaluation (Signed)
Anesthesia Post Note  Patient: Michele Palmer  Procedure(s) Performed: Procedure(s) (LRB): VENTRAL INCISIONAL HERNIA X 2 OPEN REPAIR (N/A) INSERTION OF MESH (N/A)  Patient location during evaluation: PACU Anesthesia Type: General Level of consciousness: awake and alert and patient cooperative Pain management: pain level controlled Vital Signs Assessment: post-procedure vital signs reviewed and stable Respiratory status: spontaneous breathing and respiratory function stable Cardiovascular status: stable Anesthetic complications: no    Last Vitals:  Filed Vitals:   03/20/16 1300 03/20/16 1347  BP: 139/69 147/71  Pulse: 85 73  Temp: 36.7 C 37.2 C  Resp: 22 22    Last Pain:  Filed Vitals:   03/20/16 1411  PainSc: [place]

## 2016-03-20 NOTE — Anesthesia Preprocedure Evaluation (Addendum)
Anesthesia Evaluation  Patient identified by MRN, date of birth, ID band Patient awake    Reviewed: Allergy & Precautions, NPO status , Patient's Chart, lab work & pertinent test results  History of Anesthesia Complications Negative for: history of anesthetic complications  Airway Mallampati: II  TM Distance: >3 FB Neck ROM: full    Dental  (+) Partial Upper, Dental Advisory Given   Pulmonary neg pulmonary ROS,    breath sounds clear to auscultation       Cardiovascular hypertension, Pt. on medications + Peripheral Vascular Disease   Rhythm:regular Rate:Normal     Neuro/Psych Anxiety Depression    GI/Hepatic GERD  ,  Endo/Other  diabetes, Type 2Obese  Blood sugar = 104  Renal/GU      Musculoskeletal  (+) Arthritis ,   Abdominal   Peds  Hematology   Anesthesia Other Findings   Reproductive/Obstetrics                            Anesthesia Physical Anesthesia Plan  ASA: II  Anesthesia Plan: General   Post-op Pain Management:    Induction: Intravenous  Airway Management Planned: Oral ETT  Additional Equipment:   Intra-op Plan:   Post-operative Plan: Extubation in OR  Informed Consent: I have reviewed the patients History and Physical, chart, labs and discussed the procedure including the risks, benefits and alternatives for the proposed anesthesia with the patient or authorized representative who has indicated his/her understanding and acceptance.     Plan Discussed with: CRNA, Anesthesiologist and Surgeon  Anesthesia Plan Comments:         Anesthesia Quick Evaluation

## 2016-03-21 ENCOUNTER — Encounter (HOSPITAL_COMMUNITY): Payer: Self-pay | Admitting: General Practice

## 2016-03-21 LAB — CBC
HEMATOCRIT: 37.8 % (ref 36.0–46.0)
Hemoglobin: 12.1 g/dL (ref 12.0–15.0)
MCH: 30.1 pg (ref 26.0–34.0)
MCHC: 32 g/dL (ref 30.0–36.0)
MCV: 94 fL (ref 78.0–100.0)
Platelets: 192 10*3/uL (ref 150–400)
RBC: 4.02 MIL/uL (ref 3.87–5.11)
RDW: 12.8 % (ref 11.5–15.5)
WBC: 9.2 10*3/uL (ref 4.0–10.5)

## 2016-03-21 LAB — BASIC METABOLIC PANEL
ANION GAP: 8 (ref 5–15)
BUN: 11 mg/dL (ref 6–20)
CO2: 26 mmol/L (ref 22–32)
Calcium: 8.8 mg/dL — ABNORMAL LOW (ref 8.9–10.3)
Chloride: 100 mmol/L — ABNORMAL LOW (ref 101–111)
Creatinine, Ser: 0.65 mg/dL (ref 0.44–1.00)
GFR calc Af Amer: >60 mL/min (ref >60)
GFR calc non Af Amer: >60 mL/min (ref >60)
GLUCOSE: 115 mg/dL — AB (ref 65–99)
POTASSIUM: 4.3 mmol/L (ref 3.5–5.1)
Sodium: 134 mmol/L — ABNORMAL LOW (ref 135–145)

## 2016-03-21 MED ORDER — METHOCARBAMOL 750 MG PO TABS
750.0000 mg | ORAL_TABLET | Freq: Four times a day (QID) | ORAL | Status: DC | PRN
  Administered 2016-03-21 – 2016-03-23 (×5): 750 mg via ORAL
  Filled 2016-03-21 (×6): qty 1

## 2016-03-21 MED ORDER — OXYCODONE HCL 5 MG PO TABS
5.0000 mg | ORAL_TABLET | ORAL | Status: DC | PRN
  Administered 2016-03-21 – 2016-03-23 (×6): 5 mg via ORAL
  Filled 2016-03-21 (×6): qty 1

## 2016-03-21 MED ORDER — TRAMADOL HCL 50 MG PO TABS
50.0000 mg | ORAL_TABLET | Freq: Four times a day (QID) | ORAL | Status: DC | PRN
  Administered 2016-03-21 – 2016-03-23 (×5): 100 mg via ORAL
  Filled 2016-03-21 (×6): qty 2

## 2016-03-21 NOTE — Progress Notes (Signed)
GS Progress Note Subjective: Patient doing fairly well, but has not been out of bed.  No fever.   Objective: Vital signs in last 24 hours: Temp:  [97.8 F (36.6 C)-99 F (37.2 C)] 98.2 F (36.8 C) (04/25 0522) Pulse Rate:  [65-95] 65 (04/25 0522) Resp:  [16-24] 20 (04/25 0522) BP: (95-177)/(46-100) 138/50 mmHg (04/25 0522) SpO2:  [95 %-99 %] 98 % (04/25 0522) Weight:  [94.802 kg (209 lb)] 94.802 kg (209 lb) (04/24 0740) Last BM Date: 03/19/16  Intake/Output from previous day: 04/24 0701 - 04/25 0700 In: 1840 [P.O.:240; I.V.:1500; IV Piggyback:100] Out: 2728 [Urine:2550; Drains:148; Blood:30] Intake/Output this shift:    Lungs: Clear to auscultation  Abd: Good bowel sounds.  Advancing diet. Blake drain output is a total of 138 cc.  Extremities: No clinical signs or symptoms of DVT  Neuro: Intact  Lab Results: CBC  No results for input(s): WBC, HGB, HCT, PLT in the last 72 hours. BMET No results for input(s): NA, K, CL, CO2, GLUCOSE, BUN, CREATININE, CALCIUM in the last 72 hours. PT/INR No results for input(s): LABPROT, INR in the last 72 hours. ABG No results for input(s): PHART, HCO3 in the last 72 hours.  Invalid input(s): PCO2, PO2  Studies/Results: No results found.  Anti-infectives: Anti-infectives    Start     Dose/Rate Route Frequency Ordered Stop   03/20/16 1600  ceFAZolin (ANCEF) IVPB 1 g/50 mL premix     1 g 100 mL/hr over 30 Minutes Intravenous Every 8 hours 03/20/16 1336 03/21/16 0617   03/20/16 0950  polymyxin B 500,000 Units, bacitracin 50,000 Units in sodium chloride irrigation 0.9 % 500 mL irrigation  Status:  Discontinued       As needed 03/20/16 0950 03/20/16 1130   03/20/16 0721  ceFAZolin (ANCEF) 2-4 GM/100ML-% IVPB    Comments:  Henrine Screws   : cabinet override      03/20/16 0721 03/20/16 0928   03/19/16 0711  ceFAZolin (ANCEF) 2 g in dextrose 5 % 50 mL IVPB  Status:  Discontinued     2 g 140 mL/hr over 30 Minutes Intravenous On call  to O.R. 03/19/16 0711 03/20/16 1336      Assessment/Plan: s/p Procedure(s): VENTRAL INCISIONAL HERNIA X 2 OPEN REPAIR INSERTION OF MESH d/c foley Advance diet Ambulate with PT  LOS: 1 day    [name]. [name], MD, FACS [phone] [facility] 03/21/2016

## 2016-03-21 NOTE — Evaluation (Signed)
Physical Therapy Evaluation Patient Details Name: Michele Palmer MRN: [id number] DOB: Jun 29, 1936 Today's Date: 03/21/2016   History of Present Illness  Pt is a 80 y/o female who presents s/p hernia repair x2.   Clinical Impression  Pt admitted with above diagnosis. Pt currently with functional limitations due to the deficits listed below (see PT Problem List). At the time of PT eval pt was able to perform transfers and ambulation with min guard assist for balance support and safety. Pt required increased encouragement to participate due to pain, however RN was able to premedicate with IV pain meds at beginning of session. Daughter present and very supportive. Pt will benefit from skilled PT to increase their independence and safety with mobility to allow discharge to the venue listed below.       Follow Up Recommendations Home health PT;Supervision for mobility/OOB    Equipment Recommendations  None recommended by PT    Recommendations for Other Services       Precautions / Restrictions Precautions Precautions: Fall Required Braces or Orthoses: Other Brace/Splint Other Brace/Splint: Abdominal binder Restrictions Weight Bearing Restrictions: No      Mobility  Bed Mobility Overal bed mobility: Needs Assistance Bed Mobility: Rolling;Sidelying to Sit Rolling: Supervision Sidelying to sit: Min guard       General bed mobility comments: Hands-on assist to transition to full sitting position. Use of bed rails required and increased time due to pain.   Transfers Overall transfer level: Needs assistance Equipment used: Rolling walker (2 wheeled) Transfers: Sit to/from Stand Sit to Stand: Min assist         General transfer comment: VC's for hand placement on seated surface for safety. Bed height elevated for ease of transfer.   Ambulation/Gait Ambulation/Gait assistance: Min guard Ambulation Distance (Feet): 100 Feet Assistive device: Rolling walker (2 wheeled) Gait  Pattern/deviations: Step-through pattern;Decreased stride length;Trunk flexed Gait velocity: Decreased Gait velocity interpretation: Below normal speed for age/gender General Gait Details: Trunk flexed due to pain. Pt was able to ambulate fairly well with the RW and no gross instances of unsteadiness.   Stairs            Wheelchair Mobility    Modified Rankin (Stroke Patients Only)       Balance Overall balance assessment: Needs assistance Sitting-balance support: Feet supported;No upper extremity supported Sitting balance-Leahy Scale: Fair     Standing balance support: Bilateral upper extremity supported;During functional activity Standing balance-Leahy Scale: Poor                               Pertinent Vitals/Pain Pain Assessment: Faces Faces Pain Scale: Hurts even more Pain Location: Abdomen Pain Descriptors / Indicators: Operative site guarding;Sore Pain Intervention(s): Limited activity within patient's tolerance;Monitored during session;Repositioned    Home Living Family/patient expects to be discharged to:: Private residence Living Arrangements: Spouse/significant other Available Help at Discharge: Family;Available 24 hours/day Type of Home: House Home Access: Stairs to enter Entrance Stairs-Rails: None Entrance Stairs-Number of Steps: 2 Home Layout: One level Home Equipment: Bedside commode;Walker - 2 wheels;Shower seat      Prior Function Level of Independence: Independent               Hand Dominance   Dominant Hand: Right    Extremity/Trunk Assessment   Upper Extremity Assessment: Overall WFL for tasks assessed           Lower Extremity Assessment: Overall WFL for tasks assessed  Cervical / Trunk Assessment: Normal  Communication   Communication: No difficulties  Cognition Arousal/Alertness: Awake/alert Behavior During Therapy: WFL for tasks assessed/performed Overall Cognitive Status: Within Functional Limits  for tasks assessed                      General Comments      Exercises        Assessment/Plan    PT Assessment Patient needs continued PT services  PT Diagnosis Difficulty walking;Acute pain   PT Problem List Decreased strength;Decreased range of motion;Decreased activity tolerance;Decreased balance;Decreased mobility;Decreased knowledge of use of DME;Decreased safety awareness;Decreased knowledge of precautions;Pain  PT Treatment Interventions DME instruction;Gait training;Stair training;Functional mobility training;Therapeutic activities;Therapeutic exercise;Neuromuscular re-education;Patient/family education   PT Goals (Current goals can be found in the Care Plan section) Acute Rehab PT Goals Patient Stated Goal: Pain control PT Goal Formulation: With patient/family Time For Goal Achievement: 03/28/16 Potential to Achieve Goals: Good    Frequency Min 3X/week   Barriers to discharge        Co-evaluation               End of Session Equipment Utilized During Treatment:  (Gait belt deferred due to abdominal incision) Activity Tolerance: Patient limited by pain Patient left: in chair;with call bell/phone within reach;with family/visitor present;with nursing/sitter in room Nurse Communication: Mobility status         Time: [id number] PT Time Calculation (min) (ACUTE ONLY): 26 min   Charges:   PT Evaluation $PT Eval Moderate Complexity: 1 Procedure PT Treatments $Gait Training: 8-22 mins   PT G Codes:        Rolinda Roan 03/28/16, 3:12 PM   Rolinda Roan, PT, DPT Acute Rehabilitation Services Pager: [phone]

## 2016-03-22 MED ORDER — HYDROMORPHONE HCL 1 MG/ML IJ SOLN: 0.5000 mg | INTRAMUSCULAR | Status: DC | PRN

## 2016-03-22 MED ORDER — SODIUM CHLORIDE 0.9% FLUSH: 3.0000 mL | INTRAVENOUS | Status: DC | PRN

## 2016-03-22 MED ORDER — DOCUSATE SODIUM 100 MG PO CAPS: 100.0000 mg | ORAL_CAPSULE | Freq: Every day | ORAL | Status: DC | PRN

## 2016-03-22 NOTE — Progress Notes (Signed)
GS Progress Note Subjective: Patient doing very well.  Has passed gas and had bowel movement.  Objective: Vital signs in last 24 hours: Temp:  [98.2 F (36.8 C)-99 F (37.2 C)] 98.2 F (36.8 C) (04/26 0541) Pulse Rate:  [73-82] 73 (04/26 0541) Resp:  [16-18] 16 (04/26 0541) BP: (101-154)/(51-78) 114/51 mmHg (04/26 0541) SpO2:  [85 %-97 %] 97 % (04/26 0541) Last BM Date: 03/19/16  Intake/Output from previous day: 04/25 0701 - 04/26 0700 In: 4697.5 [P.O.:840; I.V.:3757.5; IV Piggyback:100] Out: [id number] [Urine:1325; Drains:240] Intake/Output this shift: Total I/O In: -  Out: 30 [Drains:30]  Lungs: Clear  Abd: Wound is covered but looks good.  Extremities: No changes  Neuro: Intact  Lab Results: CBC   Recent Labs  03/21/16 0603  WBC 9.2  HGB 12.1  HCT 37.8  PLT 192   BMET  Recent Labs  03/21/16 0603  NA 134*  K 4.3  CL 100*  CO2 26  GLUCOSE 115*  BUN 11  CREATININE 0.65  CALCIUM 8.8*   PT/INR No results for input(s): LABPROT, INR in the last 72 hours. ABG No results for input(s): PHART, HCO3 in the last 72 hours.  Invalid input(s): PCO2, PO2  Studies/Results: No results found.  Anti-infectives: Anti-infectives    Start     Dose/Rate Route Frequency Ordered Stop   03/20/16 1600  ceFAZolin (ANCEF) IVPB 1 g/50 mL premix     1 g 100 mL/hr over 30 Minutes Intravenous Every 8 hours 03/20/16 1336 03/21/16 0617   03/20/16 0950  polymyxin B 500,000 Units, bacitracin 50,000 Units in sodium chloride irrigation 0.9 % 500 mL irrigation  Status:  Discontinued       As needed 03/20/16 0950 03/20/16 1130   03/20/16 0721  ceFAZolin (ANCEF) 2-4 GM/100ML-% IVPB    Comments:  Henrine Screws   : cabinet override      03/20/16 0721 03/20/16 0928   03/19/16 0711  ceFAZolin (ANCEF) 2 g in dextrose 5 % 50 mL IVPB  Status:  Discontinued     2 g 140 mL/hr over 30 Minutes Intravenous On call to O.R. 03/19/16 0711 03/20/16 1336      Assessment/Plan: s/p  Procedure(s): VENTRAL INCISIONAL HERNIA X 2 OPEN REPAIR INSERTION OF MESH Advance diet [place] IV   Likely home tomorrow.  May be able to remove at least on drain tomorrow.  LOS: 2 days    [name]. [name], MD, FACS [phone] [facility] 03/22/2016

## 2016-03-23 NOTE — Progress Notes (Addendum)
Physical Therapy Treatment Patient Details Name: Michele Palmer MRN: [id number] DOB: [birth year] Today's Date: 03/23/2016    History of Present Illness Pt is a 80 y/o female who presents s/p hernia repair x2.     PT Comments    Patient is progressing toward goals and with decreased pain with mobility this session. Pt unable to perform pericare. Continue to progress as tolerated with anticipated d/c home with HHPT.   Follow Up Recommendations  Home health PT;Supervision for mobility/OOB     Equipment Recommendations  None recommended by PT    Recommendations for Other Services       Precautions / Restrictions Precautions Precautions: Fall Required Braces or Orthoses: Other Brace/Splint Other Brace/Splint: Abdominal binder Restrictions Weight Bearing Restrictions: No    Mobility  Bed Mobility Overal bed mobility: Needs Assistance Bed Mobility: Rolling;Sidelying to Sit Rolling: Supervision Sidelying to sit: Min guard       General bed mobility comments: cues for sequencing and log roll technique with pt requiring increased time and min guard A to elevate trunk into sitting   Transfers Overall transfer level: Needs assistance Equipment used: Rolling walker (2 wheeled) Transfers: Sit to/from Stand Sit to Stand: Min guard         General transfer comment: cues for hand placement from EOB and BSC  Ambulation/Gait Ambulation/Gait assistance: Supervision Ambulation Distance (Feet): 125 Feet Assistive device: Rolling walker (2 wheeled) Gait Pattern/deviations: Step-through pattern;Decreased stride length;Trunk flexed Gait velocity: Decreased   General Gait Details: cues for posture and position of RW; slow and steady gait    Stairs            Wheelchair Mobility    Modified Rankin (Stroke Patients Only)       Balance     Sitting balance-Leahy Scale: Fair       Standing balance-Leahy Scale: Poor                      Cognition  Arousal/Alertness: Awake/alert Behavior During Therapy: WFL for tasks assessed/performed Overall Cognitive Status: Within Functional Limits for tasks assessed                      Exercises      General Comments General comments (skin integrity, edema, etc.): verbally educated pt on getting back into bed with pt able to verbalize understanding of technique; RN present end of session      Pertinent Vitals/Pain Pain Assessment: Faces Faces Pain Scale: Hurts little more Pain Location: abdomen Pain Descriptors / Indicators: Sore Pain Intervention(s): Limited activity within patient's tolerance;Monitored during session;Premedicated before session;Repositioned    Home Living                      Prior Function            PT Goals (current goals can now be found in the care plan section) Acute Rehab PT Goals Patient Stated Goal: none stated PT Goal Formulation: With patient/family Time For Goal Achievement: 03/28/16 Potential to Achieve Goals: Good Progress towards PT goals: Progressing toward goals    Frequency  Min 3X/week    PT Plan Current plan remains appropriate    Co-evaluation             End of Session Equipment Utilized During Treatment: Gait belt;Other (comment) (gait belt under arms) Activity Tolerance: Patient tolerated treatment well Patient left: in chair;with call bell/phone within reach;with family/visitor present;with nursing/sitter in room  Time: [id number] PT Time Calculation (min) (ACUTE ONLY): 31 min  Charges:  $Gait Training: 8-22 mins $Therapeutic Activity: 8-22 mins                    G Codes:      Salina April, PTA Pager: [phone]   03/23/2016, 10:11 AM

## 2016-03-23 NOTE — Progress Notes (Signed)
Attempted to obtain order for cdiff testing but unable to obtain one as pt has had colace on 03/23/16. Part of cdiff specimen collection panel requires that pt has not had laxatives within last 72hrs. Pt placed on enteric precautions however

## 2016-03-23 NOTE — Progress Notes (Signed)
GS Progress Note Subjective: Patient complaining of watery diarrhea this AM.  Has happened at least four times.    Objective: Vital signs in last 24 hours: Temp:  [98.3 F (36.8 C)-98.7 F (37.1 C)] 98.6 F (37 C) (04/27 0508) Pulse Rate:  [76-88] 86 (04/27 0508) Resp:  [18-19] 19 (04/27 0508) BP: (100-114)/(54-60) 100/55 mmHg (04/27 0508) SpO2:  [94 %-96 %] 96 % (04/27 0508) Last BM Date: 03/22/16  Intake/Output from previous day: 04/26 0701 - 04/27 0700 In: 1085 [P.O.:700; I.V.:385] Out: 1181 [Urine:1100; Drains:80; Stool:1] Intake/Output this shift: Total I/O In: 240 [P.O.:240] Out: 600 [Urine:600]  Lungs: Clear  Abd: Great bowel sounds.  Wound is clean and dry  Extremities: No CVT signs or symptoms.  Neuro: Intact  Lab Results: CBC   Recent Labs  03/21/16 0603  WBC 9.2  HGB 12.1  HCT 37.8  PLT 192   BMET  Recent Labs  03/21/16 0603  NA 134*  K 4.3  CL 100*  CO2 26  GLUCOSE 115*  BUN 11  CREATININE 0.65  CALCIUM 8.8*   PT/INR No results for input(s): LABPROT, INR in the last 72 hours. ABG No results for input(s): PHART, HCO3 in the last 72 hours.  Invalid input(s): PCO2, PO2  Studies/Results: No results found.  Anti-infectives: Anti-infectives    Start     Dose/Rate Route Frequency Ordered Stop   03/20/16 1600  ceFAZolin (ANCEF) IVPB 1 g/50 mL premix     1 g 100 mL/hr over 30 Minutes Intravenous Every 8 hours 03/20/16 1336 03/21/16 0617   03/20/16 0950  polymyxin B 500,000 Units, bacitracin 50,000 Units in sodium chloride irrigation 0.9 % 500 mL irrigation  Status:  Discontinued       As needed 03/20/16 0950 03/20/16 1130   03/20/16 0721  ceFAZolin (ANCEF) 2-4 GM/100ML-% IVPB    Comments:  Henrine Screws   : cabinet override      03/20/16 0721 03/20/16 0928   03/19/16 0711  ceFAZolin (ANCEF) 2 g in dextrose 5 % 50 mL IVPB  Status:  Discontinued     2 g 140 mL/hr over 30 Minutes Intravenous On call to O.R. 03/19/16 0711 03/20/16 1336      Assessment/Plan: s/p Procedure(s): VENTRAL INCISIONAL HERNIA X 2 OPEN REPAIR INSERTION OF MESH Advance diet Plan for discharge tomorrow Watch diarrhea, and if persists may check stool for C.diff.  Probably one drain out priro to discharge tomorrow.  LOS: 3 days    Michele Palmer. [name], MD, FACS [phone] [facility] 03/23/2016

## 2016-03-24 MED ORDER — METHOCARBAMOL 750 MG PO TABS: 750.0000 mg | ORAL_TABLET | Freq: Four times a day (QID) | ORAL | Status: DC | PRN

## 2016-03-24 MED ORDER — OXYCODONE HCL 5 MG PO TABS: 5.0000 mg | ORAL_TABLET | ORAL | Status: DC | PRN

## 2016-03-24 MED ORDER — TRAMADOL HCL 50 MG PO TABS: 50.0000 mg | ORAL_TABLET | Freq: Four times a day (QID) | ORAL | Status: DC | PRN

## 2016-03-24 NOTE — Progress Notes (Signed)
Patient discharged home with instructions. 

## 2016-03-24 NOTE — Discharge Instructions (Signed)
PATIENT INSTRUCTIONS HERNIA  FOLLOW-UP:  Please make an appointment with your physician in 1 week(s).  Call your physician immediately if you have any fevers greater than 102.5, drainage from you wound that is not clear or looks infected, persistent bleeding, increasing abdominal pain, problems urinating, or persistent nausea/vomiting.    WOUND CARE INSTRUCTIONS:  Keep a dry clean dressing on the wound if there is drainage. The initial bandage may be removed after 24 hours.  Once the wound has quit draining you may leave it open to air.  If clothing rubs against the wound or causes irritation and the wound is not draining you may cover it with a dry dressing during the daytime.  Try to keep the wound dry and avoid ointments on the wound unless directed to do so.  If the wound becomes bright red and painful or starts to drain infected material that is not clear, please contact your physician immediately.  If the wound is mildly pink and has a thick firm ridge underneath it, this is normal, and is referred to as a healing ridge.  This will resolve over the next 4-6 weeks.  We will instruct you on how to take care of the drain.  You will need to come back to see me next week for drain removal  DIET:  You may eat any foods that you can tolerate.  It is a good idea to eat a high fiber diet and take in plenty of fluids to prevent constipation.  If you do become constipated you may want to take a mild laxative or take ducolax tablets on a daily basis until your bowel habits are regular.  Constipation can be very uncomfortable, along with straining, after recent abdominal surgery.  ACTIVITY:  You are encouraged to cough and deep breath or use your incentive spirometer if you were given one, every 15-30 minutes when awake.  This will help prevent respiratory complications and low grade fevers post-operatively.  You may want to hug a pillow when coughing and sneezing to add additional support to the surgical area  which will decrease pain during these times.  You are encouraged to walk and engage in light activity for the next two weeks.  You should not lift more than 20 pounds during this time frame as it could put you at increased risk for a hernia recurrence.  Twenty pounds is roughly equivalent to a plastic bag of groceries.    MEDICATIONS:  Try to take narcotic medications and anti-inflammatory medications, such as tylenol, ibuprofen, naprosyn, etc., with food.  This will minimize stomach upset from the medication.  Should you develop nausea and vomiting from the pain medication, or develop a rash, please discontinue the medication and contact your physician.  You should not drive, make important decisions, or operate machinery when taking narcotic pain medication.  QUESTIONS:  Please feel free to call your physician or the hospital operator if you have any questions, and they will be glad to assist you.

## 2016-03-24 NOTE — Discharge Summary (Signed)
Physician Discharge Summary  Patient ID: Michele Palmer MRN: [id number] DOB/AGE: [birth year] 80 y.o.  Admit date: 03/20/2016 Discharge date: 03/24/2016  Admission Diagnoses:  Ventral hernia  Discharge Diagnoses: Same Active Problems:   Ventral hernia   Discharged Condition: good  Hospital Course: Admitted after large ventral hernia repair with mesh.  Did well.  Wound is good.  No eveidence of infection.  Drain output tapering.  Bowels working.  Diarrhea resolved, no C.diff.  Consults: None  Significant Diagnostic Studies: labs: cbc  Treatments: IV hydration, antibiotics: Ancef and analgesia: oxycodone, tramadol and Robaxin  Discharge Exam: Blood pressure 112/78, pulse 76, temperature 97.9 F (36.6 C), temperature source Oral, resp. rate 19, weight 94.802 kg (209 lb), SpO2 95 %. General appearance: alert, cooperative, appears stated age and no distress Resp: clear to auscultation bilaterally GI: normal findings: bowel sounds normal and dressing covering wound is clean and dry and drain out.  Disposition: 01-Home or Self Care      Discharge Instructions    Change dressing (specify)    Complete by:  As directed   Dressing change: one time per day using 4x4 gauze and around the drain also..     Diet - low sodium heart healthy    Complete by:  As directed      Increase activity slowly    Complete by:  As directed      Lifting restrictions    Complete by:  As directed   No lifting > 20 pounds for the next 6 weeks            Medication List    TAKE these medications        acetaminophen 500 MG tablet  Commonly known as:  TYLENOL  Take 500-1,000 mg by mouth every 6 (six) hours as needed (pain).     ALPRAZolam 0.25 MG tablet  Commonly known as:  XANAX  Take 0.125 mg by mouth at bedtime as needed for sleep.     anastrozole 1 MG tablet  Commonly known as:  ARIMIDEX  Take 1 tablet (1 mg total) by mouth daily.     citalopram 10 MG tablet  Commonly known as:  CELEXA   Take 5 mg by mouth at bedtime.     losartan 100 MG tablet  Commonly known as:  COZAAR  Take 100 mg by mouth daily.     methocarbamol 750 MG tablet  Commonly known as:  ROBAXIN  Take 1 tablet (750 mg total) by mouth every 6 (six) hours as needed for muscle spasms.     multivitamin with minerals Tabs tablet  Take 1 tablet by mouth daily.     oxyCODONE 5 MG immediate release tablet  Commonly known as:  Oxy IR/ROXICODONE  Take 1 tablet (5 mg total) by mouth every 4 (four) hours as needed for moderate pain, severe pain or breakthrough pain.     ranitidine 150 MG tablet  Commonly known as:  ZANTAC  Take 150 mg by mouth 2 (two) times daily as needed (acid reflux).     traMADol 50 MG tablet  Commonly known as:  ULTRAM  Take 1-2 tablets (50-100 mg total) by mouth every 6 (six) hours as needed for moderate pain or severe pain.       Follow-up Information    Follow up with Judeth Horn, MD. Call on 03/31/2016.   Specialty:  General Surgery   Why:  for drain removal.Appointment is for 2:30 pm on May 5   Contact information:  [address] [place] [address] [phone]       Signed: Judeth Horn 03/24/2016, 2:32 PM

## 2016-03-24 NOTE — Progress Notes (Signed)
Physical Therapy Treatment Patient Details Name: SYRENITY KUZNICKI MRN: [id number] DOB: 20-Jul-1936 Today's Date: 03/24/2016    History of Present Illness Pt is a 80 y/o female who presents s/p hernia repair x2.     PT Comments    Pt performed stair and gait training in preparation for d/c home.  Pt remains to reports she cannot perform perianal care unassisted but reports daughter can assist at d/c.    Follow Up Recommendations  Home health PT;Supervision for mobility/OOB     Equipment Recommendations  None recommended by PT    Recommendations for Other Services       Precautions / Restrictions Precautions Precautions: Fall Required Braces or Orthoses: Other Brace/Splint Other Brace/Splint: Abdominal binder Restrictions Weight Bearing Restrictions: No    Mobility  Bed Mobility Overal bed mobility: Needs Assistance Bed Mobility: Rolling;Sidelying to Sit Rolling: Supervision Sidelying to sit: Supervision       General bed mobility comments: cues for sequencing and log roll technique with pt requiring increased time.  Transfers Overall transfer level: Needs assistance Equipment used: Rolling walker (2 wheeled) Transfers: Sit to/from Stand Sit to Stand: Supervision         General transfer comment: Cues for hand placement  Ambulation/Gait Ambulation/Gait assistance: Supervision Ambulation Distance (Feet): 160 Feet Assistive device: Rolling walker (2 wheeled) Gait Pattern/deviations: Step-through pattern;Decreased stride length;Trunk flexed Gait velocity: Decreased   General Gait Details: cues for posture and position of RW; slow and steady gait    Stairs Stairs: Yes Stairs assistance: Min assist Stair Management: No rails;With walker;Forwards;Backwards Number of Stairs: 2 General stair comments: Cues for sequencing and RW placement to ascend 2 stairs backwards.  Pt performed descent from stairs forwards.  Informed supervising PT of patients progress to stair  training and need for stair training for safe d/c home.   Wheelchair Mobility    Modified Rankin (Stroke Patients Only)       Balance Overall balance assessment: Needs assistance   Sitting balance-Leahy Scale: Fair       Standing balance-Leahy Scale: Poor                      Cognition Arousal/Alertness: Awake/alert Behavior During Therapy: WFL for tasks assessed/performed Overall Cognitive Status: Within Functional Limits for tasks assessed                      Exercises      General Comments        Pertinent Vitals/Pain Pain Assessment: Faces Pain Score: 5  Pain Location: abdomen Pain Descriptors / Indicators: Sore Pain Intervention(s): Monitored during session;Repositioned    Home Living                      Prior Function            PT Goals (current goals can now be found in the care plan section) Acute Rehab PT Goals Patient Stated Goal: none stated Potential to Achieve Goals: Good Progress towards PT goals: Progressing toward goals    Frequency  Min 3X/week    PT Plan Current plan remains appropriate    Co-evaluation             End of Session Equipment Utilized During Treatment: Gait belt Activity Tolerance: Patient tolerated treatment well Patient left: in chair;with call bell/phone within reach;with family/visitor present;with nursing/sitter in room     Time: [id number] PT Time Calculation (min) (ACUTE ONLY): 11 min  Charges:  $  Gait Training: 8-22 mins                    G Codes:      Cristela Blue 2016/04/11, 1:05 PM Governor Rooks, PTA pager [phone]

## 2016-03-24 NOTE — Care Management Note (Signed)
Case Management Note  Patient Details  Name: Michele Palmer MRN: [id number] Date of Birth: Oct 03, 1936  Subjective/Objective:                    Action/Plan:  Patient already discharged . Called cell phone no answer and voice mail box has not been set up . Called husband , he said they use [place] . Referral given to [facility]  Expected Discharge Date:                  Expected Discharge Plan:  [place]  In-House Referral:     Discharge planning Services  CM Consult  Post Acute Care Choice:  Home Health Choice offered to:  Spouse  DME Arranged:    DME Agency:     HH Arranged:  PT [address]:  [place]  Status of Service:  Completed, signed off  Medicare Important Message Given:    Date Medicare IM Given:    Medicare IM give by:    Date Additional Medicare IM Given:    Additional Medicare Important Message give by:     If discussed at [place] of Stay Meetings, dates discussed:    Additional Comments:  [name], RN 03/24/2016, 2:02 PM

## 2016-09-07 ENCOUNTER — Other Ambulatory Visit: Payer: Self-pay | Admitting: Hematology and Oncology

## 2016-09-07 DIAGNOSIS — C50511 Malignant neoplasm of lower-outer quadrant of right female breast: Secondary | ICD-10-CM

## 2016-10-23 ENCOUNTER — Other Ambulatory Visit: Payer: Self-pay | Admitting: Hematology and Oncology

## 2016-10-23 DIAGNOSIS — Z853 Personal history of malignant neoplasm of breast: Secondary | ICD-10-CM

## 2016-11-21 ENCOUNTER — Ambulatory Visit
Admission: RE | Admit: 2016-11-21 | Discharge: 2016-11-21 | Disposition: A | Discharge status: Home / Self Care | Payer: Medicare Other | Source: Ambulatory Visit | Attending: Hematology and Oncology | Admitting: Hematology and Oncology

## 2016-11-21 DIAGNOSIS — Z853 Personal history of malignant neoplasm of breast: Secondary | ICD-10-CM

## 2016-12-06 IMAGING — CT CT ABD-PELV W/ CM
2 of 5 series · 11 of 46 positions shown, 12 images · IV contrast (Iodine)
Comparison: CT abdomen and pelvis of 02/01/2015

CLINICAL DATA: History of diverticular abscess, followup, drain
placed on 02/02/2015

EXAM:
CT ABDOMEN AND PELVIS WITH CONTRAST
TECHNIQUE: Multidetector CT imaging of the abdomen and pelvis was performed
using the standard protocol following bolus administration of
intravenous contrast.
CONTRAST:  80mL OMNIPAQUE IOHEXOL 300 MG/ML  SOLN

[Series 201: routine, idose (2) · axial · 0.69mm/px · z∈[-310,+55]mm · 8 of 93 slices shown, 9 images]
[im 10/93  soft-tissue]
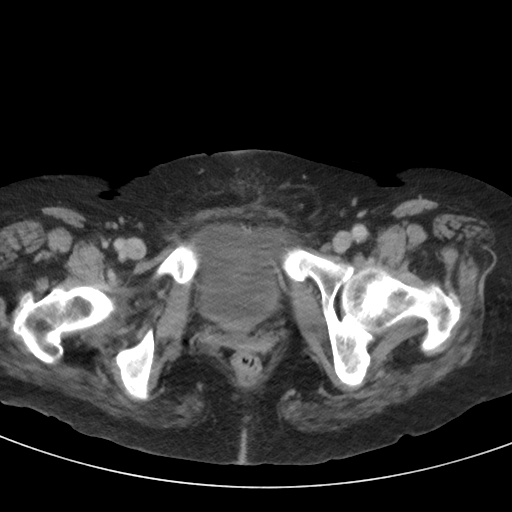
[im 10/93  bone]
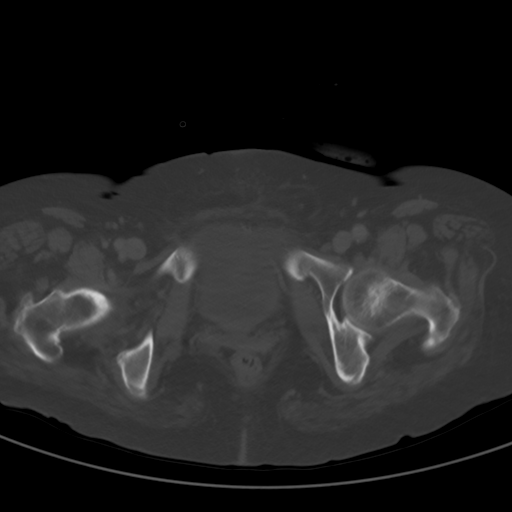
[im 20/93  soft-tissue]
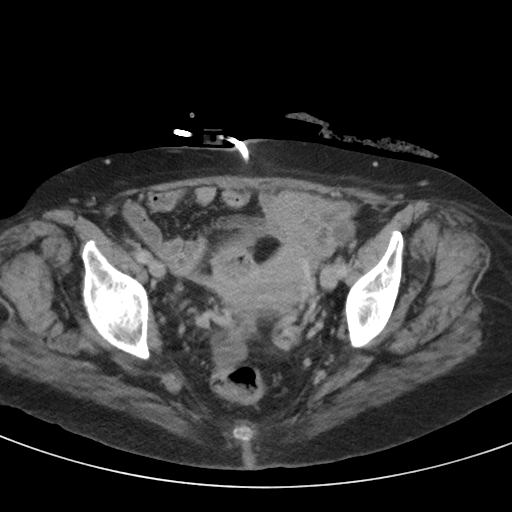
[im 30/93  soft-tissue]
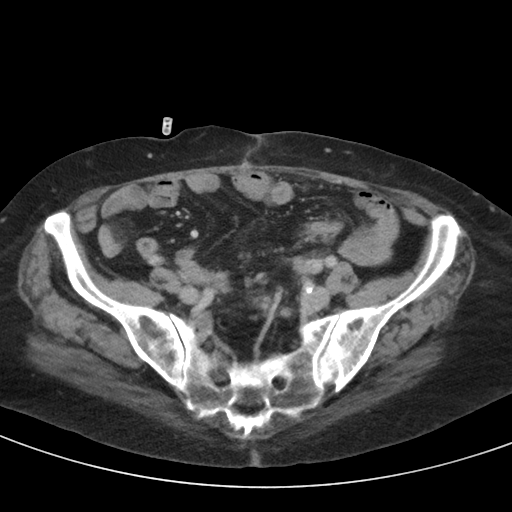
[im 39/93  soft-tissue]
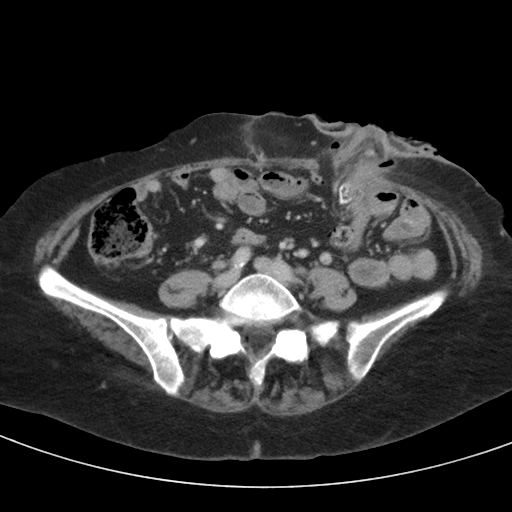
[im 54/93  soft-tissue]
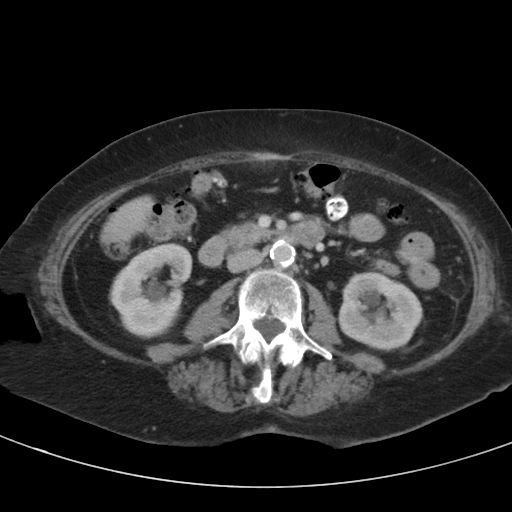
[im 63/93  soft-tissue]
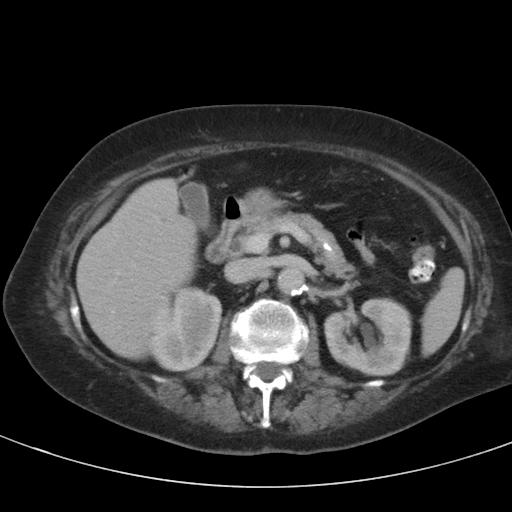
[im 73/93  soft-tissue]
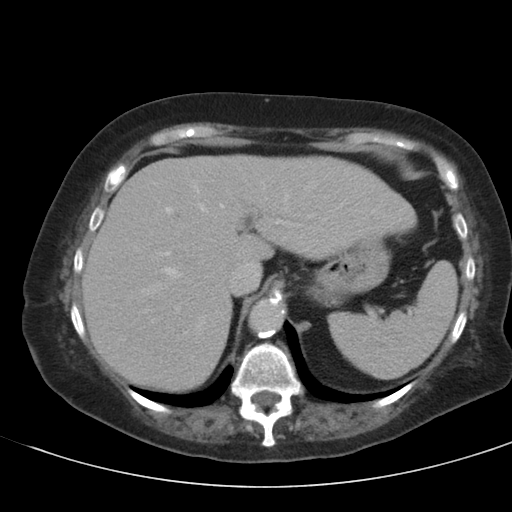
[im 83/93  soft-tissue]
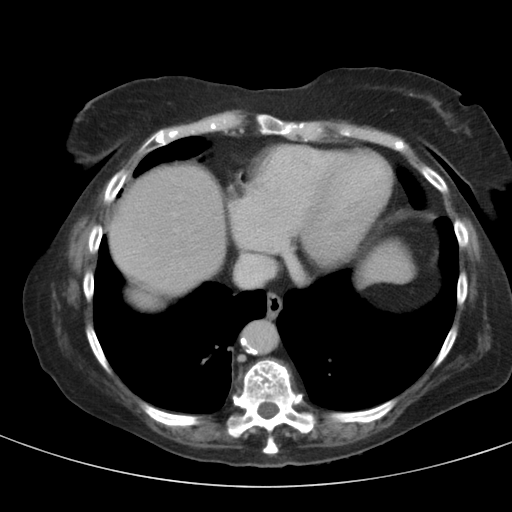

[Series 202: coronals, idose (2) · coronal · 0.45mm/px · 3 of 86 slices shown]
[im 29/86  soft-tissue]
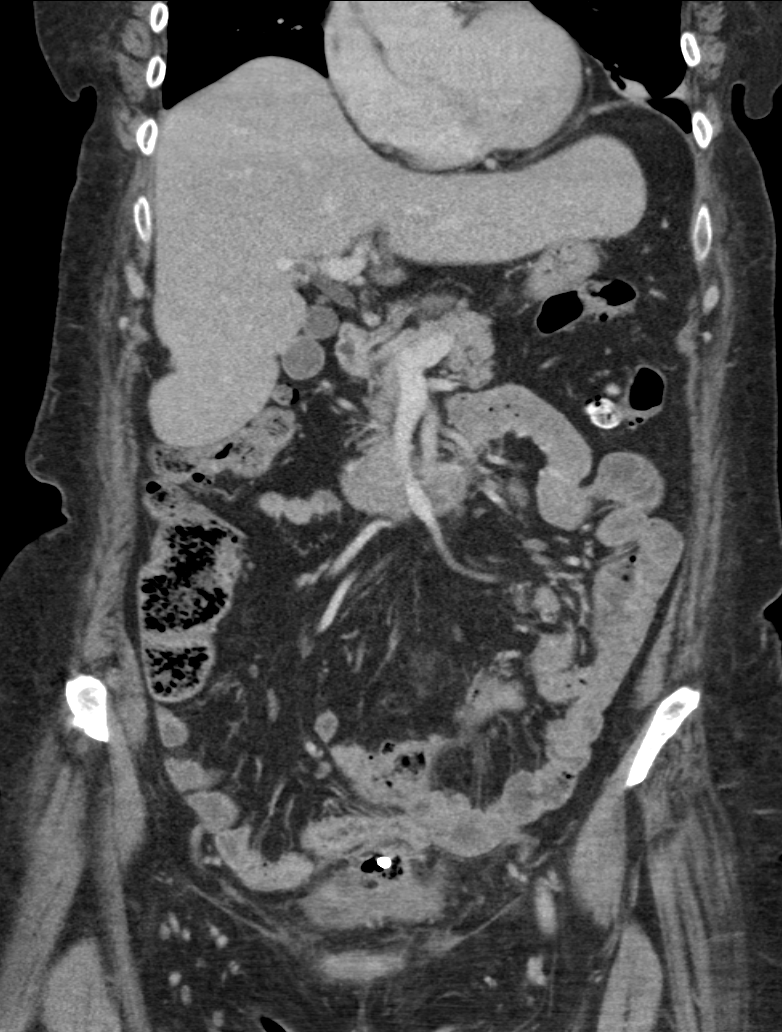
[im 38/86  soft-tissue]
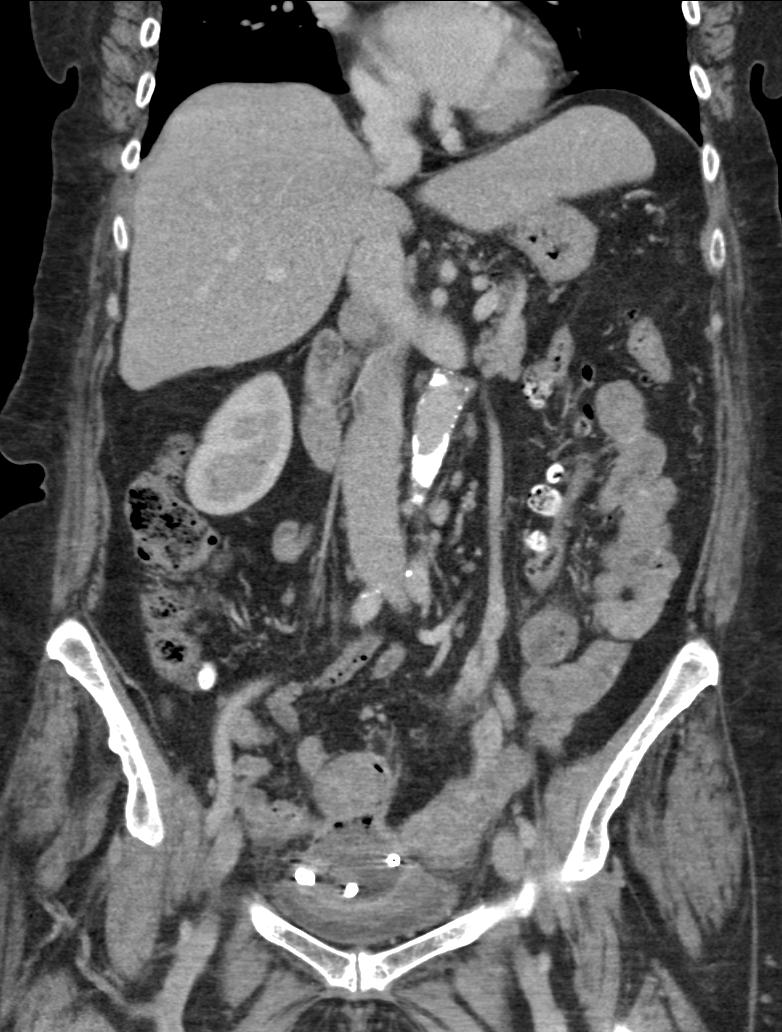
[im 48/86  soft-tissue]
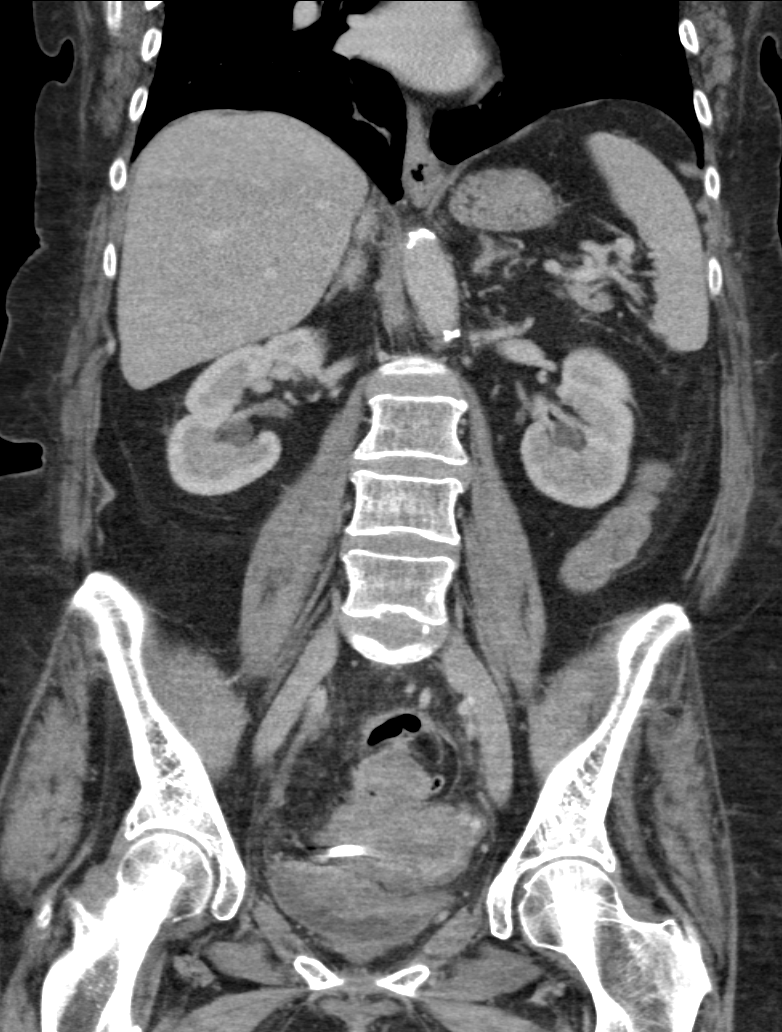

[11 of 46 positions shown; findings below may reference images not displayed]

FINDINGS: Scarring in the lingular with mild bronchiectatic change is stable.
Coronary artery calcifications are noted primarily in the
distribution of the left anterior descending. The liver enhances
with no focal abnormality and no ductal dilatation is seen. No
calcified gallstones are noted although there is some debris of
higher attenuation layering in the gallbladder dependently in small
noncalcified gallstones cannot be excluded. The pancreas is normal
in size and the pancreatic duct is not dilated. The adrenal glands
and spleen are unremarkable. The kidneys enhance with no calculus or
mass. On delayed images, the pelvocaliceal systems are unremarkable
and the proximal ureters are normal in caliber. Small parapelvic
renal cysts are noted bilaterally. The abdominal aorta is normal in
caliber. No adenopathy is seen.

An ostomy is noted within the left lower quadrant. The abscess
drainage catheter remains coiled within the abscess cavity. The
abscess currently measures 5.3 x 3.9 cm compared to prior
measurements of 6.2 x 4.6 cm. No additional pelvic abscess is seen.
The uterus is unremarkable. The urinary bladder is not well
distended. No free fluid is seen within the pelvis. Multiple
rectosigmoid colonic diverticula are noted. There are diverticula
scattered throughout the more proximal colon as well. The terminal
ileum is unremarkable. The lumbar vertebrae are in normal alignment.
IMPRESSION: 1. Slight decrease in size of the mid pelvic abscess with abscess
drainage catheter remaining.
2. Diffuse colonic diverticula.
3. Cannot exclude noncalcified gallstones.
4. Coronary artery calcifications.

## 2017-02-20 ENCOUNTER — Encounter: Payer: Self-pay | Admitting: Hematology and Oncology

## 2017-02-20 ENCOUNTER — Ambulatory Visit (HOSPITAL_BASED_OUTPATIENT_CLINIC_OR_DEPARTMENT_OTHER): Payer: Medicare Other | Admitting: Hematology and Oncology

## 2017-02-20 DIAGNOSIS — Z170 Estrogen receptor positive status [ER+]: Secondary | ICD-10-CM

## 2017-02-20 DIAGNOSIS — C50511 Malignant neoplasm of lower-outer quadrant of right female breast: Secondary | ICD-10-CM

## 2017-02-20 DIAGNOSIS — Z79811 Long term (current) use of aromatase inhibitors: Secondary | ICD-10-CM | POA: Diagnosis not present

## 2017-02-20 NOTE — Progress Notes (Signed)
Patient Care Team: Glenda Chroman, MD as PCP - General (Internal Medicine)  DIAGNOSIS:  Encounter Diagnosis  Name Primary?  . Malignant neoplasm of lower-outer quadrant of right breast of female, estrogen receptor positive ([place])     SUMMARY OF ONCOLOGIC HISTORY:   Malignant neoplasm of lower-outer quadrant of right breast of female, estrogen receptor positive ([place])   06/10/2013 Initial Diagnosis    Invasive lobular cancer ER/PR positive HER-2 negative      08/01/2013 Surgery    Right lumpectomy: Invasive lobular cancer 1.8 cm, grade 1, margins clear, 0/2 lymph nodes, ER positive, PR positive, HER-2 negative, Ki-67 11%, T1 cN0 stage IA      09/15/2013 - 10/31/2013 Radiation Therapy    Adjuvant radiation therapy by Dr. Orlene Erm at [facility]      11/10/2013 -  Anti-estrogen oral therapy    Anastrozole 1 mg daily 5 years       CHIEF COMPLIANT: Follow-up on anastrozole therapy  INTERVAL HISTORY: Michele Palmer is a 81 year old with above-mentioned history of right breast cancer underwent lumpectomy followed by radiation and is currently on anastrozole since 2014. She has very occasional hot flashes but otherwise tolerating it fairly well. She does not have any myalgias or arthralgias.  REVIEW OF SYSTEMS:   Constitutional: Denies fevers, chills or abnormal weight loss Eyes: Denies blurriness of vision Ears, nose, mouth, throat, and face: Denies mucositis or sore throat Respiratory: Denies cough, dyspnea or wheezes Cardiovascular: Denies palpitation, chest discomfort Gastrointestinal:  Denies nausea, heartburn or change in bowel habits Skin: Denies abnormal skin rashes Lymphatics: Denies new lymphadenopathy or easy bruising Neurological:Denies numbness, tingling or new weaknesses Behavioral/Psych: Mood is stable, no new changes  Extremities: No lower extremity edema Breast:  denies any pain or lumps or nodules in either breasts All other systems were reviewed with the patient and are  negative.  I have reviewed the past medical history, past surgical history, social history and family history with the patient and they are unchanged from previous note.  ALLERGIES:  is allergic to aspirin; ciprofloxacin; norco [hydrocodone-acetaminophen]; codeine; and sulfur.  MEDICATIONS:  Current Outpatient Prescriptions  Medication Sig Dispense Refill  . acetaminophen (TYLENOL) 500 MG tablet Take 500-1,000 mg by mouth every 6 (six) hours as needed (pain).    Marland Kitchen ALPRAZolam (XANAX) 0.25 MG tablet Take 0.125 mg by mouth at bedtime as needed for sleep.     Marland Kitchen anastrozole (ARIMIDEX) 1 MG tablet TAKE 1 TABLET BY MOUTH EVERY DAY 90 tablet 3  . citalopram (CELEXA) 10 MG tablet Take 5 mg by mouth at bedtime.     Marland Kitchen losartan (COZAAR) 100 MG tablet Take 100 mg by mouth daily.    . methocarbamol (ROBAXIN) 750 MG tablet Take 1 tablet (750 mg total) by mouth every 6 (six) hours as needed for muscle spasms. 20 tablet 0  . Multiple Vitamin (MULTIVITAMIN WITH MINERALS) TABS tablet Take 1 tablet by mouth daily. 60 tablet 0  . oxyCODONE (OXY IR/ROXICODONE) 5 MG immediate release tablet Take 1 tablet (5 mg total) by mouth every 4 (four) hours as needed for moderate pain, severe pain or breakthrough pain. 30 tablet 0  . ranitidine (ZANTAC) 150 MG tablet Take 150 mg by mouth 2 (two) times daily as needed (acid reflux).     . traMADol (ULTRAM) 50 MG tablet Take 1-2 tablets (50-100 mg total) by mouth every 6 (six) hours as needed for moderate pain or severe pain. 30 tablet 0   No current facility-administered medications  for this visit.     PHYSICAL EXAMINATION: ECOG PERFORMANCE STATUS: 1 - Symptomatic but completely ambulatory  Vitals:   02/20/17 1114  BP: (!) 155/71  Pulse: 79  Resp: 19  Temp: 97.8 F (36.6 C)   Filed Weights   02/20/17 1114  Weight: 225 lb (102.1 kg)    GENERAL:alert, no distress and comfortable SKIN: skin color, texture, turgor are normal, no rashes or significant lesions EYES:  normal, Conjunctiva are pink and non-injected, sclera clear OROPHARYNX:no exudate, no erythema and lips, buccal mucosa, and tongue normal  NECK: supple, thyroid normal size, non-tender, without nodularity LYMPH:  no palpable lymphadenopathy in the cervical, axillary or inguinal LUNGS: clear to auscultation and percussion with normal breathing effort HEART: regular rate & rhythm and no murmurs and no lower extremity edema ABDOMEN:abdomen soft, non-tender and normal bowel sounds MUSCULOSKELETAL:no cyanosis of digits and no clubbing  NEURO: alert & oriented x 3 with fluent speech, no focal motor/sensory deficits EXTREMITIES: No lower extremity edema BREAST: No palpable masses or nodules in either right or left breasts. No palpable axillary supraclavicular or infraclavicular adenopathy no breast tenderness or nipple discharge. (exam performed in the presence of a chaperone)  LABORATORY DATA:  I have reviewed the data as listed   Chemistry      Component Value Date/Time   NA 134 (L) 03/21/2016 0603   NA 140 08/24/2015 1338   K 4.3 03/21/2016 0603   K 4.5 08/24/2015 1338   CL 100 (L) 03/21/2016 0603   CO2 26 03/21/2016 0603   CO2 28 08/24/2015 1338   BUN 11 03/21/2016 0603   BUN 23.4 08/24/2015 1338   CREATININE 0.65 03/21/2016 0603   CREATININE 0.8 08/24/2015 1338      Component Value Date/Time   CALCIUM 8.8 (L) 03/21/2016 0603   CALCIUM 9.6 08/24/2015 1338   ALKPHOS 93 03/13/2016 1158   ALKPHOS 110 08/24/2015 1338   AST 17 03/13/2016 1158   AST 15 08/24/2015 1338   ALT 15 03/13/2016 1158   ALT 14 08/24/2015 1338   BILITOT 0.7 03/13/2016 1158   BILITOT 0.37 08/24/2015 1338       Lab Results  Component Value Date   WBC 9.2 03/21/2016   HGB 12.1 03/21/2016   HCT 37.8 03/21/2016   MCV 94.0 03/21/2016   PLT 192 03/21/2016   NEUTROABS 4.3 03/13/2016    ASSESSMENT & PLAN:  Malignant neoplasm of lower-outer quadrant of right breast of female, estrogen receptor positive  ([place]) Right breast invasive lobular carcinoma diagnosed July 1 2014status post lumpectomy 08/01/2013, 1.8 cm ILC, grade 1, 0/2 sentinel nodes, ER/PR positive, HER-2 negative, Ki-67 11%, T1 cN0 stage IA status post radiation by Dr. Orlene Erm in [place], on Arimidex 1 mg daily since 11/10/2013, patient could not come back to see [place] and was taking it every other day. Full dose resumed 08/24/2015  Breast cancer surveillance: 1. Breast exam 02/21/2017 is normal 3. Mammograms 11/21/2016: Normal with breast density category B  Anastrozole toxicities: 1. Occasional hot flashes 2. Mild to moderate muscle stiffness  Otherwise patient appears to be tolerating this Very well. Return to clinic in 1 year.   I spent 25 minutes talking to the patient of which more than half was spent in counseling and coordination of care.  No orders of the defined types were placed in this encounter.  The patient has a good understanding of the overall plan. she agrees with it. she will call with any problems that may develop before the  next visit here.   Rulon Eisenmenger, MD 02/20/17

## 2017-02-20 NOTE — Assessment & Plan Note (Signed)
Right breast invasive lobular carcinoma diagnosed July 1 2014status post lumpectomy 08/01/2013, 1.8 cm ILC, grade 1, 0/2 sentinel nodes, ER/PR positive, HER-2 negative, Ki-67 11%, T1 cN0 stage IA status post radiation by Dr. Orlene Erm in [place], on Arimidex 1 mg daily since 11/10/2013, patient could not come back to see [place] and was taking it every other day. Full dose resumed 08/24/2015  Breast cancer surveillance: 1. Breast exam 02/21/2017 is normal 3. Mammograms 11/21/2016: Normal with breast density category B  Anastrozole toxicities: 1. Occasional hot flashes 2. Mild to moderate muscle stiffness  Otherwise patient appears to be tolerating this Very well. Return to clinic in 1 year.

## 2017-06-13 NOTE — Patient Instructions (Signed)
Your procedure is scheduled on: 06/26/2017  Report to [facility] at  50   AM.  Call this number if you have problems the morning of surgery: [phone]   Do not eat food or drink liquids :After Midnight.      Take these medicines the morning of surgery with A SIP OF WATER: armidex, celexa, cozaar, zantac.   Do not wear jewelry, make-up or nail polish.  Do not wear lotions, powders, or perfumes. You may wear deodorant.  Do not shave 48 hours prior to surgery.  Do not bring valuables to the hospital.  Contacts, dentures or bridgework may not be worn into surgery.  Leave suitcase in the car. After surgery it may be brought to your room.  For patients admitted to the hospital, checkout time is 11:00 AM the day of discharge.   Patients discharged the day of surgery will not be allowed to drive home.  :     Please read over the following fact sheets that you were given: Coughing and Deep Breathing, Surgical Site Infection Prevention, Anesthesia Post-op Instructions and Care and Recovery After Surgery    Cataract A cataract is a clouding of the lens of the eye. When a lens becomes cloudy, vision is reduced based on the degree and nature of the clouding. Many cataracts reduce vision to some degree. Some cataracts make people more near-sighted as they develop. Other cataracts increase glare. Cataracts that are ignored and become worse can sometimes look white. The white color can be seen through the pupil. CAUSES   Aging. However, cataracts may occur at any age, even in newborns.   Certain drugs.   Trauma to the eye.   Certain diseases such as diabetes.   Specific eye diseases such as chronic inflammation inside the eye or a sudden attack of a rare form of glaucoma.   Inherited or acquired medical problems.  SYMPTOMS   Gradual, progressive drop in vision in the affected eye.   Severe, rapid visual loss. This most often happens when trauma is the cause.  DIAGNOSIS  To detect a  cataract, an eye doctor examines the lens. Cataracts are best diagnosed with an exam of the eyes with the pupils enlarged (dilated) by drops.  TREATMENT  For an early cataract, vision may improve by using different eyeglasses or stronger lighting. If that does not help your vision, surgery is the only effective treatment. A cataract needs to be surgically removed when vision loss interferes with your everyday activities, such as driving, reading, or watching TV. A cataract may also have to be removed if it prevents examination or treatment of another eye problem. Surgery removes the cloudy lens and usually replaces it with a substitute lens (intraocular lens, IOL).  At a time when both you and your doctor agree, the cataract will be surgically removed. If you have cataracts in both eyes, only one is usually removed at a time. This allows the operated eye to heal and be out of danger from any possible problems after surgery (such as infection or poor wound healing). In rare cases, a cataract may be doing damage to your eye. In these cases, your caregiver may advise surgical removal right away. The vast majority of people who have cataract surgery have better vision afterward. HOME CARE INSTRUCTIONS  If you are not planning surgery, you may be asked to do the following:  Use different eyeglasses.   Use stronger or brighter lighting.   Ask your eye doctor about reducing  your medicine dose or changing medicines if it is thought that a medicine caused your cataract. Changing medicines does not make the cataract go away on its own.   Become familiar with your surroundings. Poor vision can lead to injury. Avoid bumping into things on the affected side. You are at a higher risk for tripping or falling.   Exercise extreme care when driving or operating machinery.   Wear sunglasses if you are sensitive to bright light or experiencing problems with glare.  SEEK IMMEDIATE MEDICAL CARE IF:   You have a  worsening or sudden vision loss.   You notice redness, swelling, or increasing pain in the eye.   You have a fever.  Document Released: 11/13/2005 Document Revised: 11/02/2011 Document Reviewed: 07/07/2011 [facility] Patient Information 2012 [place].PATIENT INSTRUCTIONS POST-ANESTHESIA  IMMEDIATELY FOLLOWING SURGERY:  Do not drive or operate machinery for the first twenty four hours after surgery.  Do not make any important decisions for twenty four hours after surgery or while taking narcotic pain medications or sedatives.  If you develop intractable nausea and vomiting or a severe headache please notify your doctor immediately.  FOLLOW-UP:  Please make an appointment with your surgeon as instructed. You do not need to follow up with anesthesia unless specifically instructed to do so.  WOUND CARE INSTRUCTIONS (if applicable):  Keep a dry clean dressing on the anesthesia/puncture wound site if there is drainage.  Once the wound has quit draining you may leave it open to air.  Generally you should leave the bandage intact for twenty four hours unless there is drainage.  If the epidural site drains for more than 36-48 hours please call the anesthesia department.  QUESTIONS?:  Please feel free to call your physician or the hospital operator if you have any questions, and they will be happy to assist you.

## 2017-06-18 ENCOUNTER — Encounter (HOSPITAL_COMMUNITY)
Admission: RE | Admit: 2017-06-18 | Discharge: 2017-06-18 | Disposition: A | Discharge status: Home / Self Care | Payer: Medicare Other | Source: Ambulatory Visit | Attending: Ophthalmology | Admitting: Ophthalmology

## 2017-06-18 ENCOUNTER — Other Ambulatory Visit: Payer: Self-pay

## 2017-06-18 ENCOUNTER — Encounter (HOSPITAL_COMMUNITY): Payer: Self-pay

## 2017-06-18 DIAGNOSIS — Z01812 Encounter for preprocedural laboratory examination: Secondary | ICD-10-CM | POA: Insufficient documentation

## 2017-06-18 DIAGNOSIS — Z01810 Encounter for preprocedural cardiovascular examination: Secondary | ICD-10-CM | POA: Diagnosis not present

## 2017-06-18 LAB — CBC WITH DIFFERENTIAL/PLATELET
BASOS ABS: 0 10*3/uL (ref 0.0–0.1)
BASOS PCT: 1 %
EOS ABS: 0.2 10*3/uL (ref 0.0–0.7)
Eosinophils Relative: 2 %
HEMATOCRIT: 40.3 % (ref 36.0–46.0)
HEMOGLOBIN: 13.2 g/dL (ref 12.0–15.0)
Lymphocytes Relative: 27 %
Lymphs Abs: 2.1 10*3/uL (ref 0.7–4.0)
MCH: 31.1 pg (ref 26.0–34.0)
MCHC: 32.8 g/dL (ref 30.0–36.0)
MCV: 94.8 fL (ref 78.0–100.0)
Monocytes Absolute: 0.6 10*3/uL (ref 0.1–1.0)
Monocytes Relative: 7 %
NEUTROS ABS: 4.9 10*3/uL (ref 1.7–7.7)
NEUTROS PCT: 63 %
Platelets: 193 10*3/uL (ref 150–400)
RBC: 4.25 MIL/uL (ref 3.87–5.11)
RDW: 13.5 % (ref 11.5–15.5)
WBC: 7.8 10*3/uL (ref 4.0–10.5)

## 2017-06-18 LAB — BASIC METABOLIC PANEL
ANION GAP: 6 (ref 5–15)
BUN: 27 mg/dL — ABNORMAL HIGH (ref 6–20)
CALCIUM: 9.4 mg/dL (ref 8.9–10.3)
CHLORIDE: 102 mmol/L (ref 101–111)
CO2: 30 mmol/L (ref 22–32)
Creatinine, Ser: 0.76 mg/dL (ref 0.44–1.00)
GFR calc non Af Amer: >60 mL/min (ref >60)
Glucose, Bld: 169 mg/dL — ABNORMAL HIGH (ref 65–99)
Potassium: 4.3 mmol/L (ref 3.5–5.1)
SODIUM: 138 mmol/L (ref 135–145)

## 2017-06-25 MED ORDER — NEOMYCIN-POLYMYXIN-DEXAMETH 3.5-10000-0.1 OP SUSP
OPHTHALMIC | Status: AC
  Filled 2017-06-25: qty 5

## 2017-06-25 MED ORDER — PHENYLEPHRINE HCL 2.5 % OP SOLN
OPHTHALMIC | Status: AC
  Filled 2017-06-25: qty 15

## 2017-06-25 MED ORDER — TETRACAINE HCL 0.5 % OP SOLN
OPHTHALMIC | Status: AC
  Filled 2017-06-25: qty 4

## 2017-06-25 MED ORDER — LIDOCAINE HCL (PF) 1 % IJ SOLN
INTRAMUSCULAR | Status: AC
  Filled 2017-06-25: qty 2

## 2017-06-25 MED ORDER — LIDOCAINE HCL 3.5 % OP GEL
OPHTHALMIC | Status: AC
Start: 2017-06-25 — End: 2017-06-25
  Filled 2017-06-25: qty 1

## 2017-06-25 MED ORDER — CYCLOPENTOLATE-PHENYLEPHRINE 0.2-1 % OP SOLN
OPHTHALMIC | Status: AC
  Filled 2017-06-25: qty 2

## 2017-06-26 ENCOUNTER — Ambulatory Visit (HOSPITAL_COMMUNITY): Payer: Medicare Other | Admitting: Anesthesiology

## 2017-06-26 ENCOUNTER — Encounter (HOSPITAL_COMMUNITY)
Admission: RE | Disposition: A | Discharge status: Home / Self Care | Payer: Self-pay | Source: Ambulatory Visit | Attending: Ophthalmology

## 2017-06-26 ENCOUNTER — Encounter (HOSPITAL_COMMUNITY): Payer: Self-pay

## 2017-06-26 ENCOUNTER — Ambulatory Visit (HOSPITAL_COMMUNITY)
Admission: RE | Admit: 2017-06-26 | Discharge: 2017-06-26 | Disposition: A | Discharge status: Home / Self Care | Payer: Medicare Other | Source: Ambulatory Visit | Attending: Ophthalmology | Admitting: Ophthalmology

## 2017-06-26 DIAGNOSIS — H25812 Combined forms of age-related cataract, left eye: Secondary | ICD-10-CM | POA: Diagnosis present

## 2017-06-26 DIAGNOSIS — E119 Type 2 diabetes mellitus without complications: Secondary | ICD-10-CM | POA: Insufficient documentation

## 2017-06-26 DIAGNOSIS — F419 Anxiety disorder, unspecified: Secondary | ICD-10-CM | POA: Insufficient documentation

## 2017-06-26 DIAGNOSIS — Z79899 Other long term (current) drug therapy: Secondary | ICD-10-CM | POA: Insufficient documentation

## 2017-06-26 DIAGNOSIS — F329 Major depressive disorder, single episode, unspecified: Secondary | ICD-10-CM | POA: Insufficient documentation

## 2017-06-26 DIAGNOSIS — I10 Essential (primary) hypertension: Secondary | ICD-10-CM | POA: Insufficient documentation

## 2017-06-26 HISTORY — PX: CATARACT EXTRACTION W/PHACO: SHX586

## 2017-06-26 LAB — GLUCOSE, CAPILLARY: Glucose-Capillary: 108 mg/dL — ABNORMAL HIGH (ref 65–99)

## 2017-06-26 SURGERY — PHACOEMULSIFICATION, CATARACT, WITH IOL INSERTION
Anesthesia: Monitor Anesthesia Care | Site: Eye | Laterality: Left

## 2017-06-26 MED ORDER — NA HYALUR & NA CHOND-NA HYALUR 0.55-0.5 ML IO KIT
PACK | INTRAOCULAR | Status: DC | PRN
  Administered 2017-06-26: 1 via OPHTHALMIC

## 2017-06-26 MED ORDER — TETRACAINE HCL 0.5 % OP SOLN
1.0000 [drp] | OPHTHALMIC | Status: AC
  Administered 2017-06-26 (×3): 1 [drp] via OPHTHALMIC

## 2017-06-26 MED ORDER — MIDAZOLAM HCL 2 MG/2ML IJ SOLN
INTRAMUSCULAR | Status: AC
  Filled 2017-06-26: qty 2

## 2017-06-26 MED ORDER — BSS IO SOLN
INTRAOCULAR | Status: DC | PRN
  Administered 2017-06-26: 15 mL via INTRAOCULAR

## 2017-06-26 MED ORDER — POVIDONE-IODINE 5 % OP SOLN
OPHTHALMIC | Status: DC | PRN
  Administered 2017-06-26: 1 via OPHTHALMIC

## 2017-06-26 MED ORDER — FENTANYL CITRATE (PF) 100 MCG/2ML IJ SOLN
25.0000 ug | Freq: Once | INTRAMUSCULAR | Status: AC
  Administered 2017-06-26: 25 ug via INTRAVENOUS

## 2017-06-26 MED ORDER — EPINEPHRINE PF 1 MG/ML IJ SOLN
INTRAOCULAR | Status: DC | PRN
  Administered 2017-06-26: 500 mL

## 2017-06-26 MED ORDER — MIDAZOLAM HCL 2 MG/2ML IJ SOLN
1.0000 mg | INTRAMUSCULAR | Status: AC
  Administered 2017-06-26: 2 mg via INTRAVENOUS

## 2017-06-26 MED ORDER — PHENYLEPHRINE HCL 2.5 % OP SOLN
1.0000 [drp] | OPHTHALMIC | Status: AC
  Administered 2017-06-26 (×3): 1 [drp] via OPHTHALMIC

## 2017-06-26 MED ORDER — NEOMYCIN-POLYMYXIN-DEXAMETH 3.5-10000-0.1 OP SUSP
OPHTHALMIC | Status: DC | PRN
  Administered 2017-06-26: 2 [drp] via OPHTHALMIC

## 2017-06-26 MED ORDER — FENTANYL CITRATE (PF) 100 MCG/2ML IJ SOLN
INTRAMUSCULAR | Status: AC
  Filled 2017-06-26: qty 2

## 2017-06-26 MED ORDER — LACTATED RINGERS IV SOLN
INTRAVENOUS | Status: DC
  Administered 2017-06-26: 07:00:00 via INTRAVENOUS

## 2017-06-26 MED ORDER — LIDOCAINE HCL 3.5 % OP GEL
1.0000 "application " | Freq: Once | OPHTHALMIC | Status: AC
  Administered 2017-06-26: 1 via OPHTHALMIC

## 2017-06-26 MED ORDER — CYCLOPENTOLATE-PHENYLEPHRINE 0.2-1 % OP SOLN
1.0000 [drp] | OPHTHALMIC | Status: AC
  Administered 2017-06-26 (×3): 1 [drp] via OPHTHALMIC

## 2017-06-26 MED ORDER — LIDOCAINE HCL (PF) 1 % IJ SOLN
INTRAMUSCULAR | Status: DC | PRN
  Administered 2017-06-26: .8 mL

## 2017-06-26 SURGICAL SUPPLY — 10 items

## 2017-06-26 NOTE — Transfer of Care (Signed)
Immediate Anesthesia Transfer of Care Note  Patient: Michele Palmer  Procedure(s) Performed: Procedure(s) with comments: CATARACT EXTRACTION PHACO AND INTRAOCULAR LENS PLACEMENT (IOC) (Left) - CDE: 10.31  Patient Location: Short Stay  Anesthesia Type:MAC  Level of Consciousness: awake  Airway & Oxygen Therapy: Patient Spontanous Breathing  Post-op Assessment: Report given to RN  Post vital signs: Reviewed  Last Vitals:  Vitals:   06/26/17 0700 06/26/17 0705  BP: (!) 161/102 (!) 167/82  Pulse:    Resp:    Temp:      Last Pain:  Vitals:   06/26/17 0635  TempSrc: Oral         Complications: No apparent anesthesia complications

## 2017-06-26 NOTE — Op Note (Signed)
Date of Admission: 06/26/2017  Date of Surgery: 06/26/2017  Pre-Op Dx: Cataract Left  Eye  Post-Op Dx: Senile Combined Cataract  Left  Eye,  Dx Code Y19.509  Surgeon: Tonny Branch, M.D.  Assistants: None  Anesthesia: Topical with MAC  Indications: Painless, progressive loss of vision with compromise of daily activities.  Surgery: Cataract Extraction with Intraocular lens Implant Left Eye  Discription: The patient had dilating drops and viscous lidocaine placed into the Left eye in the pre-op holding area. After transfer to the operating room, a time out was performed. The patient was then prepped and draped. Beginning with a 72m blade a paracentesis port was made at the surgeon's 2 o'clock position. The anterior chamber was then filled with 1% non-preserved lidocaine. This was followed by filling the anterior chamber with Viscoat.  A 2.457mkeratome blade was used to make a clear corneal incision at the temporal limbus.  A bent cystatome needle was used to create a continuous tear capsulotomy. Hydrodissection was performed with balanced salt solution on a Fine canula. The lens nucleus was then removed using the phacoemulsification handpiece. Residual cortex was removed with the I&A handpiece. The anterior chamber and capsular bag were refilled with Provisc. A posterior chamber intraocular lens was placed into the capsular bag with it's injector. The implant was positioned with the Kuglan hook. The Provisc was then removed from the anterior chamber and capsular bag with the I&A handpiece. Stromal hydration of the main incision and paracentesis port was performed with BSS on a Fine canula. The wounds were tested for leak which was negative. The patient tolerated the procedure well. There were no operative complications. The patient was then transferred to the recovery room in stable condition.  Complications: None  Specimen: None  EBL: None  Prosthetic device: Abbott Technis, PCB00, power 24.0, SN  [id number]

## 2017-06-26 NOTE — Addendum Note (Signed)
Addendum  created 06/26/17 0817 by Ollen Bowl, CRNA   Anesthesia Event edited, Anesthesia Staff edited

## 2017-06-26 NOTE — Discharge Instructions (Signed)

## 2017-06-26 NOTE — H&P (Signed)
I have reviewed the H&P, the patient was re-examined, and I have identified no interval changes in medical condition and plan of care since the history and physical of record  

## 2017-06-26 NOTE — Anesthesia Postprocedure Evaluation (Signed)
Anesthesia Post Note  Patient: Michele Palmer  Procedure(s) Performed: Procedure(s) (LRB): CATARACT EXTRACTION PHACO AND INTRAOCULAR LENS PLACEMENT (IOC) (Left)  Patient location during evaluation: Short Stay Anesthesia Type: MAC Level of consciousness: awake and alert and oriented Pain management: pain level controlled Vital Signs Assessment: post-procedure vital signs reviewed and stable Respiratory status: spontaneous breathing Cardiovascular status: stable Postop Assessment: no signs of nausea or vomiting Anesthetic complications: no     Last Vitals:  Vitals:   06/26/17 0700 06/26/17 0705  BP: (!) 161/102 (!) 167/82  Pulse:    Resp:    Temp:      Last Pain:  Vitals:   06/26/17 0635  TempSrc: Oral                 Dewaine Morocho

## 2017-06-26 NOTE — Anesthesia Preprocedure Evaluation (Signed)
Anesthesia Evaluation  Patient identified by MRN, date of birth, ID band Patient awake    Reviewed: Allergy & Precautions, NPO status , Patient's Chart, lab work & pertinent test results  History of Anesthesia Complications Negative for: history of anesthetic complications  Airway Mallampati: II  TM Distance: >3 FB Neck ROM: full    Dental  (+) Partial Upper, Dental Advisory Given   Pulmonary neg pulmonary ROS,    breath sounds clear to auscultation       Cardiovascular hypertension, Pt. on medications + Peripheral Vascular Disease   Rhythm:regular Rate:Normal     Neuro/Psych PSYCHIATRIC DISORDERS Anxiety Depression    GI/Hepatic GERD  ,  Endo/Other  diabetes, Type 2Morbid obesity  Renal/GU      Musculoskeletal  (+) Arthritis ,   Abdominal   Peds  Hematology   Anesthesia Other Findings   Reproductive/Obstetrics                             Anesthesia Physical Anesthesia Plan  ASA: III  Anesthesia Plan: MAC   Post-op Pain Management:    Induction: Intravenous  PONV Risk Score and Plan:   Airway Management Planned: Nasal Cannula  Additional Equipment:   Intra-op Plan:   Post-operative Plan:   Informed Consent: I have reviewed the patients History and Physical, chart, labs and discussed the procedure including the risks, benefits and alternatives for the proposed anesthesia with the patient or authorized representative who has indicated his/her understanding and acceptance.     Plan Discussed with:   Anesthesia Plan Comments:         Anesthesia Quick Evaluation

## 2017-07-05 ENCOUNTER — Encounter (HOSPITAL_COMMUNITY): Payer: Self-pay

## 2017-07-05 ENCOUNTER — Encounter (HOSPITAL_COMMUNITY)
Admission: RE | Admit: 2017-07-05 | Discharge: 2017-07-05 | Disposition: A | Discharge status: Home / Self Care | Payer: Medicare Other | Source: Ambulatory Visit | Attending: Ophthalmology | Admitting: Ophthalmology

## 2017-07-09 ENCOUNTER — Encounter (HOSPITAL_COMMUNITY): Payer: Self-pay | Admitting: *Deleted

## 2017-07-09 ENCOUNTER — Ambulatory Visit (HOSPITAL_COMMUNITY): Payer: Medicare Other | Admitting: Anesthesiology

## 2017-07-09 ENCOUNTER — Encounter (HOSPITAL_COMMUNITY)
Admission: RE | Disposition: A | Discharge status: Home / Self Care | Payer: Self-pay | Source: Ambulatory Visit | Attending: Ophthalmology

## 2017-07-09 ENCOUNTER — Ambulatory Visit (HOSPITAL_COMMUNITY)
Admission: RE | Admit: 2017-07-09 | Discharge: 2017-07-09 | Disposition: A | Discharge status: Home / Self Care | Payer: Medicare Other | Source: Ambulatory Visit | Attending: Ophthalmology | Admitting: Ophthalmology

## 2017-07-09 DIAGNOSIS — Z853 Personal history of malignant neoplasm of breast: Secondary | ICD-10-CM | POA: Diagnosis not present

## 2017-07-09 DIAGNOSIS — Z79899 Other long term (current) drug therapy: Secondary | ICD-10-CM | POA: Diagnosis not present

## 2017-07-09 DIAGNOSIS — K589 Irritable bowel syndrome without diarrhea: Secondary | ICD-10-CM | POA: Insufficient documentation

## 2017-07-09 DIAGNOSIS — H5231 Anisometropia: Secondary | ICD-10-CM | POA: Diagnosis not present

## 2017-07-09 DIAGNOSIS — K219 Gastro-esophageal reflux disease without esophagitis: Secondary | ICD-10-CM | POA: Diagnosis not present

## 2017-07-09 DIAGNOSIS — Z7952 Long term (current) use of systemic steroids: Secondary | ICD-10-CM | POA: Diagnosis not present

## 2017-07-09 DIAGNOSIS — E119 Type 2 diabetes mellitus without complications: Secondary | ICD-10-CM | POA: Insufficient documentation

## 2017-07-09 DIAGNOSIS — Z886 Allergy status to analgesic agent status: Secondary | ICD-10-CM | POA: Insufficient documentation

## 2017-07-09 DIAGNOSIS — M1990 Unspecified osteoarthritis, unspecified site: Secondary | ICD-10-CM | POA: Diagnosis not present

## 2017-07-09 DIAGNOSIS — Z823 Family history of stroke: Secondary | ICD-10-CM | POA: Insufficient documentation

## 2017-07-09 DIAGNOSIS — H25811 Combined forms of age-related cataract, right eye: Secondary | ICD-10-CM | POA: Insufficient documentation

## 2017-07-09 DIAGNOSIS — Z961 Presence of intraocular lens: Secondary | ICD-10-CM | POA: Diagnosis not present

## 2017-07-09 DIAGNOSIS — I10 Essential (primary) hypertension: Secondary | ICD-10-CM | POA: Insufficient documentation

## 2017-07-09 DIAGNOSIS — F419 Anxiety disorder, unspecified: Secondary | ICD-10-CM | POA: Diagnosis not present

## 2017-07-09 DIAGNOSIS — Z6841 Body Mass Index (BMI) 40.0 and over, adult: Secondary | ICD-10-CM | POA: Insufficient documentation

## 2017-07-09 DIAGNOSIS — H5201 Hypermetropia, right eye: Secondary | ICD-10-CM | POA: Diagnosis not present

## 2017-07-09 DIAGNOSIS — Z882 Allergy status to sulfonamides status: Secondary | ICD-10-CM | POA: Insufficient documentation

## 2017-07-09 DIAGNOSIS — Z885 Allergy status to narcotic agent status: Secondary | ICD-10-CM | POA: Insufficient documentation

## 2017-07-09 DIAGNOSIS — Z9842 Cataract extraction status, left eye: Secondary | ICD-10-CM | POA: Diagnosis not present

## 2017-07-09 DIAGNOSIS — Z9889 Other specified postprocedural states: Secondary | ICD-10-CM | POA: Diagnosis not present

## 2017-07-09 HISTORY — PX: CATARACT EXTRACTION W/PHACO: SHX586

## 2017-07-09 LAB — GLUCOSE, CAPILLARY: Glucose-Capillary: 119 mg/dL — ABNORMAL HIGH (ref 65–99)

## 2017-07-09 SURGERY — PHACOEMULSIFICATION, CATARACT, WITH IOL INSERTION
Anesthesia: Monitor Anesthesia Care | Site: Eye | Laterality: Right

## 2017-07-09 MED ORDER — MIDAZOLAM HCL 2 MG/2ML IJ SOLN
1.0000 mg | INTRAMUSCULAR | Status: AC
Start: 2017-07-09 — End: 2017-07-09
  Administered 2017-07-09 (×2): 2 mg via INTRAVENOUS
  Filled 2017-07-09: qty 2

## 2017-07-09 MED ORDER — POVIDONE-IODINE 5 % OP SOLN
OPHTHALMIC | Status: DC | PRN
  Administered 2017-07-09: 1 via OPHTHALMIC

## 2017-07-09 MED ORDER — PHENYLEPHRINE HCL 2.5 % OP SOLN
1.0000 [drp] | OPHTHALMIC | Status: AC
  Administered 2017-07-09 (×3): 1 [drp] via OPHTHALMIC

## 2017-07-09 MED ORDER — FENTANYL CITRATE (PF) 100 MCG/2ML IJ SOLN
25.0000 ug | Freq: Once | INTRAMUSCULAR | Status: AC
  Administered 2017-07-09: 25 ug via INTRAVENOUS

## 2017-07-09 MED ORDER — LACTATED RINGERS IV SOLN
INTRAVENOUS | Status: DC
  Administered 2017-07-09: 12:00:00 via INTRAVENOUS

## 2017-07-09 MED ORDER — EPINEPHRINE PF 1 MG/ML IJ SOLN
INTRAMUSCULAR | Status: AC
  Filled 2017-07-09: qty 1

## 2017-07-09 MED ORDER — NEOMYCIN-POLYMYXIN-DEXAMETH 3.5-10000-0.1 OP SUSP
OPHTHALMIC | Status: DC | PRN
  Administered 2017-07-09: 2 [drp] via OPHTHALMIC

## 2017-07-09 MED ORDER — FENTANYL CITRATE (PF) 100 MCG/2ML IJ SOLN
INTRAMUSCULAR | Status: AC
  Filled 2017-07-09: qty 2

## 2017-07-09 MED ORDER — BSS IO SOLN
INTRAOCULAR | Status: DC | PRN
  Administered 2017-07-09: 15 mL via INTRAOCULAR

## 2017-07-09 MED ORDER — MIDAZOLAM HCL 2 MG/2ML IJ SOLN
INTRAMUSCULAR | Status: AC
  Filled 2017-07-09: qty 2

## 2017-07-09 MED ORDER — TETRACAINE HCL 0.5 % OP SOLN
1.0000 [drp] | OPHTHALMIC | Status: AC
  Administered 2017-07-09 (×3): 1 [drp] via OPHTHALMIC

## 2017-07-09 MED ORDER — LIDOCAINE HCL 3.5 % OP GEL
1.0000 "application " | Freq: Once | OPHTHALMIC | Status: AC
  Administered 2017-07-09: 1 via OPHTHALMIC

## 2017-07-09 MED ORDER — LIDOCAINE HCL (PF) 1 % IJ SOLN
INTRAMUSCULAR | Status: DC | PRN
  Administered 2017-07-09: .6 mL

## 2017-07-09 MED ORDER — PROVISC 10 MG/ML IO SOLN
INTRAOCULAR | Status: DC | PRN
  Administered 2017-07-09: 0.85 mL via INTRAOCULAR

## 2017-07-09 MED ORDER — EPINEPHRINE PF 1 MG/ML IJ SOLN
INTRAMUSCULAR | Status: DC | PRN
  Administered 2017-07-09: 500 mL

## 2017-07-09 MED ORDER — CYCLOPENTOLATE-PHENYLEPHRINE 0.2-1 % OP SOLN
1.0000 [drp] | OPHTHALMIC | Status: AC
  Administered 2017-07-09 (×3): 1 [drp] via OPHTHALMIC

## 2017-07-09 SURGICAL SUPPLY — 12 items

## 2017-07-09 NOTE — Anesthesia Preprocedure Evaluation (Signed)
Anesthesia Evaluation  Patient identified by MRN, date of birth, ID band Patient awake    Reviewed: Allergy & Precautions, NPO status , Patient's Chart, lab work & pertinent test results  History of Anesthesia Complications Negative for: history of anesthetic complications  Airway Mallampati: II  TM Distance: >3 FB Neck ROM: full    Dental  (+) Partial Upper, Dental Advisory Given   Pulmonary neg pulmonary ROS,    breath sounds clear to auscultation       Cardiovascular hypertension, Pt. on medications + Peripheral Vascular Disease   Rhythm:regular Rate:Normal     Neuro/Psych PSYCHIATRIC DISORDERS Anxiety Depression    GI/Hepatic GERD  ,  Endo/Other  diabetes, Type 2Morbid obesity  Renal/GU      Musculoskeletal  (+) Arthritis ,   Abdominal   Peds  Hematology   Anesthesia Other Findings   Reproductive/Obstetrics                             Anesthesia Physical Anesthesia Plan  ASA: III  Anesthesia Plan: MAC   Post-op Pain Management:    Induction: Intravenous  PONV Risk Score and Plan:   Airway Management Planned: Nasal Cannula  Additional Equipment:   Intra-op Plan:   Post-operative Plan:   Informed Consent: I have reviewed the patients History and Physical, chart, labs and discussed the procedure including the risks, benefits and alternatives for the proposed anesthesia with the patient or authorized representative who has indicated his/her understanding and acceptance.     Plan Discussed with:   Anesthesia Plan Comments:         Anesthesia Quick Evaluation

## 2017-07-09 NOTE — H&P (Signed)
I have reviewed the H&P, the patient was re-examined, and I have identified no interval changes in medical condition and plan of care since the history and physical of record  

## 2017-07-09 NOTE — Op Note (Signed)
Date of Admission: 07/09/2017  Date of Surgery: 07/09/2017  Pre-Op Dx: Cataract Right  Eye  Post-Op Dx: Senile Combined Cataract  Right  Eye,  Dx Code O97.353  Surgeon: Tonny Branch, M.D.  Assistants: None  Anesthesia: Topical with MAC  Indications: Painless, progressive loss of vision with compromise of daily activities.  Surgery: Cataract Extraction with Intraocular lens Implant Right Eye  Discription: The patient had dilating drops and viscous lidocaine placed into the Right eye in the pre-op holding area. After transfer to the operating room, a time out was performed. The patient was then prepped and draped. Beginning with a 68m blade a paracentesis port was made at the surgeon's 2 o'clock position. The anterior chamber was then filled with 1% non-preserved lidocaine. This was followed by filling the anterior chamber with Provisc.  A 2.443mkeratome blade was used to make a clear corneal incision at the temporal limbus.  A bent cystatome needle was used to create a continuous tear capsulotomy. Hydrodissection was performed with balanced salt solution on a Fine canula. The lens nucleus was then removed using the phacoemulsification handpiece. Residual cortex was removed with the I&A handpiece. The anterior chamber and capsular bag were refilled with Provisc. A posterior chamber intraocular lens was placed into the capsular bag with it's injector. The implant was positioned with the Kuglan hook. The Provisc was then removed from the anterior chamber and capsular bag with the I&A handpiece. Stromal hydration of the main incision and paracentesis port was performed with BSS on a Fine canula. The wounds were tested for leak which was negative. The patient tolerated the procedure well. There were no operative complications. The patient was then transferred to the recovery room in stable condition.  Complications: None  Specimen: None  EBL: None  Prosthetic device: Abbott Technis, PCB00, power  23.5, SN [id number]

## 2017-07-09 NOTE — Transfer of Care (Signed)
Immediate Anesthesia Transfer of Care Note  Patient: Michele Palmer  Procedure(s) Performed: Procedure(s) with comments: CATARACT EXTRACTION PHACO AND INTRAOCULAR LENS PLACEMENT (IOC) (Right) - CDE: 6.62  Patient Location: PACU and Short Stay  Anesthesia Type:MAC  Level of Consciousness: awake, alert  and oriented  Airway & Oxygen Therapy: Patient Spontanous Breathing and Patient connected to nasal cannula oxygen  Post-op Assessment: Report given to RN and Post -op Vital signs reviewed and stable  Post vital signs: Reviewed and stable  Last Vitals:  Vitals:   07/09/17 1305 07/09/17 1310  BP: (!) 144/75 136/77  Pulse:    Resp: 16 18  Temp:    SpO2: 94% 94%    Last Pain:  Vitals:   07/09/17 1148  TempSrc: Oral      Patients Stated Pain Goal: 3 (17/47/15 9539)  Complications: No apparent anesthesia complications

## 2017-07-10 ENCOUNTER — Encounter (HOSPITAL_COMMUNITY): Payer: Self-pay | Admitting: Ophthalmology

## 2017-07-10 NOTE — Anesthesia Postprocedure Evaluation (Signed)
Anesthesia Post Note  Patient: Michele Palmer  Procedure(s) Performed: Procedure(s) (LRB): CATARACT EXTRACTION PHACO AND INTRAOCULAR LENS PLACEMENT (IOC) (Right)  Patient location during evaluation: Short Stay Anesthesia Type: MAC Level of consciousness: awake and alert and oriented Pain management: pain level controlled Vital Signs Assessment: post-procedure vital signs reviewed and stable Respiratory status: nonlabored ventilation, respiratory function stable and spontaneous breathing Cardiovascular status: stable Postop Assessment: no signs of nausea or vomiting and adequate PO intake Anesthetic complications: no     Last Vitals:  Vitals:   07/09/17 1310 07/09/17 1337  BP: 136/77 (!) 144/76  Pulse:  61  Resp: 18 18  Temp:  36.9 C  SpO2: 94% 95%    Last Pain:  Vitals:   07/09/17 1337  TempSrc: Oral  PainSc: 0-No pain                 Adriyanna Christians

## 2017-09-04 ENCOUNTER — Other Ambulatory Visit: Payer: Self-pay | Admitting: Hematology and Oncology

## 2017-09-04 DIAGNOSIS — C50511 Malignant neoplasm of lower-outer quadrant of right female breast: Secondary | ICD-10-CM

## 2017-10-22 ENCOUNTER — Other Ambulatory Visit: Payer: Self-pay | Admitting: Hematology and Oncology

## 2017-10-22 DIAGNOSIS — Z9889 Other specified postprocedural states: Secondary | ICD-10-CM

## 2017-11-22 ENCOUNTER — Ambulatory Visit
Admission: RE | Admit: 2017-11-22 | Discharge: 2017-11-22 | Disposition: A | Discharge status: Home / Self Care | Payer: Medicare Other | Source: Ambulatory Visit | Attending: Hematology and Oncology | Admitting: Hematology and Oncology

## 2017-11-22 DIAGNOSIS — Z9889 Other specified postprocedural states: Secondary | ICD-10-CM

## 2017-11-22 HISTORY — DX: Personal history of irradiation: Z92.3

## 2017-11-22 HISTORY — DX: Malignant neoplasm of unspecified site of unspecified female breast: C50.919

## 2018-02-11 ENCOUNTER — Encounter: Payer: Self-pay | Admitting: *Deleted

## 2018-02-12 ENCOUNTER — Telehealth: Payer: Self-pay | Admitting: Cardiovascular Disease

## 2018-02-12 ENCOUNTER — Encounter: Payer: Self-pay | Admitting: *Deleted

## 2018-02-12 ENCOUNTER — Encounter: Payer: Self-pay | Admitting: Cardiovascular Disease

## 2018-02-12 ENCOUNTER — Ambulatory Visit: Payer: Medicare Other | Admitting: Cardiovascular Disease

## 2018-02-12 ENCOUNTER — Other Ambulatory Visit: Payer: Self-pay

## 2018-02-12 VITALS — BP 168/94 | HR 80 | Ht 63.0 in | Wt 236.0 lb

## 2018-02-12 DIAGNOSIS — R079 Chest pain, unspecified: Secondary | ICD-10-CM | POA: Diagnosis not present

## 2018-02-12 DIAGNOSIS — Z9289 Personal history of other medical treatment: Secondary | ICD-10-CM | POA: Diagnosis not present

## 2018-02-12 DIAGNOSIS — I10 Essential (primary) hypertension: Secondary | ICD-10-CM

## 2018-02-12 DIAGNOSIS — R0609 Other forms of dyspnea: Secondary | ICD-10-CM

## 2018-02-12 NOTE — Telephone Encounter (Signed)
Pre-cert Verification for the following procedure   Lexiscan scheduled for 02/18/18 at [facility]

## 2018-02-12 NOTE — Addendum Note (Signed)
Addended by: Laurine Blazer on: 02/12/2018 02:09 PM   Modules accepted: Orders

## 2018-02-12 NOTE — Progress Notes (Signed)
CARDIOLOGY CONSULT NOTE  Patient ID: Michele Palmer MRN: [id number] DOB/AGE: 1936/03/12 82 y.o.  Admit date: (Not on file) Primary Physician: Glenda Chroman, MD Referring Physician: Dr. Woody Seller  Reason for Consultation: Chest pain  HPI: Michele Palmer is a 82 y.o. female who is being seen today for the evaluation of chest pain at the request of Vyas, Dhruv B, MD.   I reviewed extensive documentation, labs, and studies from both her PCP and [facility].  She was hospitalized in February for chest pain which began while she was watching television.  Blood pressure is 152/90 upon initial evaluation in the ED.  She has a history of hypertension and GERD.  Chest x-ray demonstrated mild central pulmonary vascular congestion and mild atelectasis of bilateral lung fields without pneumonia.  Chest CT demonstrated coronary artery calcifications and aortic atherosclerosis.  Relevant labs: Sodium 137, potassium 4.3, BUN 27, creatinine 0.78, normal troponins, white blood cells 9.9, hemoglobin 13.3, platelets 230.  It appeared to be deemed musculoskeletal in etiology.  I reviewed an ECG performed on 01/15/18 which demonstrated sinus rhythm with low voltage with no significant ischemic ST segment or T wave abnormalities, nor any arrhythmias.  She has some exertional dyspnea.  She said she underwent abdominal surgery in 2016 and has not regained her leg strength since that time.  She has had no further episodes of chest pain since she was hospitalized in February.  She does have episodic symptoms related to GERD.  She said her blood pressure was in the 150/80 range yesterday.  She has a history of right breast invasive lobular carcinoma status post lumpectomy (10/2013) and radiation therapy.  She follows with Dr. Lindi Adie.   Allergies  Allergen Reactions  . Amoxicillin Other (See Comments)    Dizziness , can tolerate augmentin per dr   . Aspirin Other (See Comments)    Makes me "shaky"  .  Norco [Hydrocodone-Acetaminophen] Nausea And Vomiting    Caused nausea and vomiting when she took two pills  . Penicillins Other (See Comments)    Dizziness, can tolerate augmentin per MD  Has patient had a PCN reaction causing immediate rash, facial/tongue/throat swelling, SOB or lightheadedness with hypotension: Unknown Has patient had a PCN reaction causing severe rash involving mucus membranes or skin necrosis: Unknown Has patient had a PCN reaction that required hospitalization: No Has patient had a PCN reaction occurring within the last 10 years: Unknown If all of the above answers are "NO", then may proceed with Cephalosporin use.   . Bactrim [Sulfamethoxazole-Trimethoprim] Rash    Per MD's office   . Ciprofloxacin Hives and Rash  . Codeine Other (See Comments)    Headache  . Sulfur Hives and Rash    [place]     Current Outpatient Medications  Medication Sig Dispense Refill  . acetaminophen (TYLENOL) 500 MG tablet Take 500 mg by mouth every 6 (six) hours as needed for mild pain or moderate pain.    Marland Kitchen anastrozole (ARIMIDEX) 1 MG tablet TAKE 1 TABLET BY MOUTH EVERY DAY 90 tablet 3  . citalopram (CELEXA) 10 MG tablet Take 10 mg by mouth at bedtime.     Marland Kitchen lisinopril (PRINIVIL,ZESTRIL) 10 MG tablet Take 10 mg by mouth daily.  3  . ranitidine (ZANTAC) 150 MG tablet Take 150 mg by mouth 2 (two) times daily as needed (acid reflux).      No current facility-administered medications for this visit.     Past Medical  History:  Diagnosis Date  . Abscess of axilla, left 03/2015  . Anemia   . Anxiety   . Arthritis   . Breast cancer [facility]) 2014   Right Breast Cancer  . Breast cancer, right breast ([place]) 07/04/2013   ILC, 1.8 cm, neg sln receptor+, surgery on 08/01/13   . Cancer ([place])   . Colovesical fistula   . Depression   . Diabetes mellitus without complication ([place])    lost wt yrs ago and taken off metformin.  Marland Kitchen GERD (gastroesophageal reflux disease)   . Hypertension   . Peripheral  vascular disease ([place]) 6/16   pe  . Personal history of radiation therapy 2014   Right Breast Cancer    Past Surgical History:  Procedure Laterality Date  . APPENDECTOMY  1966  . BREAST LUMPECTOMY Right 2014  . BREAST LUMPECTOMY WITH NEEDLE LOCALIZATION AND AXILLARY SENTINEL LYMPH NODE BX Right 08/01/2013   Procedure: BREAST LUMPECTOMY WITH NEEDLE LOCALIZATION AND AXILLARY SENTINEL LYMPH NODE BIOPSY;  Surgeon: Haywood Lasso, MD;  Location: [place];  Service: General;  Laterality: Right;  . CATARACT EXTRACTION W/PHACO Left 06/26/2017   Procedure: CATARACT EXTRACTION PHACO AND INTRAOCULAR LENS PLACEMENT (IOC);  Surgeon: Tonny Branch, MD;  Location: [facility];  Service: Ophthalmology;  Laterality: Left;  CDE: 10.31  . CATARACT EXTRACTION W/PHACO Right 07/09/2017   Procedure: CATARACT EXTRACTION PHACO AND INTRAOCULAR LENS PLACEMENT (IOC);  Surgeon: Tonny Branch, MD;  Location: [facility];  Service: Ophthalmology;  Laterality: Right;  CDE: 6.62  . COLOSTOMY N/A 01/21/2015   Procedure: DIVERTING LOOP COLOSTOMY;  Surgeon: Doreen Salvage, MD;  Location: [place];  Service: General;  Laterality: N/A;  . CYSTOSCOPY WITH STENT PLACEMENT N/A 03/26/2015   Procedure: CYSTOSCOPY WITH STENT PLACEMENT;  Surgeon: Kathie Rhodes, MD;  Location: [place];  Service: Urology;  Laterality: N/A;  . ILEOSTOMY Right   . ILEOSTOMY CLOSURE N/A 07/12/2015   Procedure: REVERSAL OF LOOP ILEOSTOMY ;  Surgeon: Judeth Horn, MD;  Location: [place];  Service: General;  Laterality: N/A;  . INSERTION OF MESH N/A 03/20/2016   Procedure: INSERTION OF MESH;  Surgeon: Judeth Horn, MD;  Location: [place];  Service: General;  Laterality: N/A;  . OVARY SURGERY  68   removed ovary and tube  . PARASTOMAL HERNIA REPAIR N/A 07/12/2015   Procedure: HERNIA REPAIR PARASTOMAL;  Surgeon: Judeth Horn, MD;  Location: [place];  Service: General;  Laterality: N/A;  . PARTIAL COLECTOMY N/A 03/26/2015   Procedure: COLOSTOMY TAKEDOWN, PARTIAL COLECTOMY, BLADDER  REPAIR, DIVERTING LOOP ILEOSTOMY;  Surgeon: Judeth Horn, MD;  Location: [place];  Service: General;  Laterality: N/A;  . SALPINGOOPHORECTOMY    . TONSILLECTOMY    . TUBAL LIGATION    . VENTRAL HERNIA REPAIR  03/20/2016  . VENTRAL HERNIA REPAIR N/A 03/20/2016   Procedure: VENTRAL INCISIONAL HERNIA X 2 OPEN REPAIR;  Surgeon: Judeth Horn, MD;  Location: [place];  Service: General;  Laterality: N/A;    Social History   Socioeconomic History  . Marital status: Married    Spouse name: Not on file  . Number of children: Not on file  . Years of education: Not on file  . Highest education level: Not on file  Social Needs  . Financial resource strain: Not on file  . Food insecurity - worry: Not on file  . Food insecurity - inability: Not on file  . Transportation needs - medical: Not on file  . Transportation needs - non-medical: Not on  file  Occupational History  . Not on file  Tobacco Use  . Smoking status: Never Smoker  . Smokeless tobacco: Never Used  Substance and Sexual Activity  . Alcohol use: No  . Drug use: No  . Sexual activity: Not Currently    Birth control/protection: Post-menopausal  Other Topics Concern  . Not on file  Social History Narrative  . Not on file     No family history of premature CAD in 1st degree relatives.  Current Meds  Medication Sig  . acetaminophen (TYLENOL) 500 MG tablet Take 500 mg by mouth every 6 (six) hours as needed for mild pain or moderate pain.  Marland Kitchen anastrozole (ARIMIDEX) 1 MG tablet TAKE 1 TABLET BY MOUTH EVERY DAY  . citalopram (CELEXA) 10 MG tablet Take 10 mg by mouth at bedtime.   Marland Kitchen lisinopril (PRINIVIL,ZESTRIL) 10 MG tablet Take 10 mg by mouth daily.  . ranitidine (ZANTAC) 150 MG tablet Take 150 mg by mouth 2 (two) times daily as needed (acid reflux).       Review of systems complete and found to be negative unless listed above in HPI    Physical exam Blood pressure (!) 168/94, pulse 80, height 5\' 3"  (1.6 m), weight 236 lb (107  kg), SpO2 91 %. General: NAD Neck: No JVD, no thyromegaly or thyroid nodule.  Lungs: Clear to auscultation bilaterally with normal respiratory effort. CV: Nondisplaced PMI. Regular rate and rhythm, normal S1/S2, no S3/S4, no murmur.  No peripheral edema.  No carotid bruit.  Abdomen: Soft, nontender, no distention.  Skin: Intact without lesions or rashes.  Neurologic: Alert and oriented x 3.  Psych: Normal affect. Extremities: No clubbing or cyanosis.  HEENT: Normal.   ECG: Most recent ECG reviewed.   Labs: Lab Results  Component Value Date/Time   K 4.3 06/18/2017 08:12 AM   K 4.5 08/24/2015 01:38 PM   BUN 27 (H) 06/18/2017 08:12 AM   BUN 23.4 08/24/2015 01:38 PM   CREATININE 0.76 06/18/2017 08:12 AM   CREATININE 0.8 08/24/2015 01:38 PM   ALT 15 03/13/2016 11:58 AM   ALT 14 08/24/2015 01:38 PM   HGB 13.2 06/18/2017 08:12 AM   HGB 12.9 08/24/2015 01:38 PM     Lipids: Lab Results  Component Value Date/Time   TRIG 184 (H) 01/25/2015 04:40 AM        ASSESSMENT AND PLAN:   1.  Chest pain and exertional dyspnea: She has had no symptom recurrence.  I will try to obtain a copy of the echocardiogram report from her PCP. I will proceed with a nuclear myocardial perfusion imaging study to evaluate for ischemic heart disease (Lexiscan Myoview).  2.  Hypertension: Blood pressure is elevated.  She started lisinopril in early March.  If it is elevated at her next visit, I will increase the dosage to 20 mg daily.    Disposition: Follow up in 2 months  Signed: Kate Sable, M.D., F.A.C.C.  02/12/2018, 1:45 PM

## 2018-02-12 NOTE — Patient Instructions (Signed)
Medication Instructions:  Continue all current medications.  Labwork: none  Testing/Procedures:  Your physician has requested that you have a lexiscan myoview. For further information please visit [email or web address]. Please follow instruction sheet, as given.  Office will contact with results via phone or letter.    Follow-Up: 2 months   Any Other Special Instructions Will Be Listed Below (If Applicable).  If you need a refill on your cardiac medications before your next appointment, please call your pharmacy.

## 2018-02-13 ENCOUNTER — Telehealth: Payer: Self-pay | Admitting: Hematology and Oncology

## 2018-02-13 NOTE — Telephone Encounter (Signed)
Tried to call regarding voicemail  °

## 2018-02-18 ENCOUNTER — Encounter (HOSPITAL_BASED_OUTPATIENT_CLINIC_OR_DEPARTMENT_OTHER)
Admission: RE | Admit: 2018-02-18 | Discharge: 2018-02-18 | Disposition: A | Discharge status: Home / Self Care | Payer: Medicare Other | Source: Ambulatory Visit | Attending: Cardiovascular Disease | Admitting: Cardiovascular Disease

## 2018-02-18 ENCOUNTER — Encounter (HOSPITAL_COMMUNITY): Payer: Self-pay

## 2018-02-18 ENCOUNTER — Encounter (HOSPITAL_COMMUNITY)
Admission: RE | Admit: 2018-02-18 | Discharge: 2018-02-18 | Disposition: A | Discharge status: Home / Self Care | Payer: Medicare Other | Source: Ambulatory Visit | Attending: Cardiovascular Disease | Admitting: Cardiovascular Disease

## 2018-02-18 DIAGNOSIS — R079 Chest pain, unspecified: Secondary | ICD-10-CM

## 2018-02-18 LAB — NM MYOCAR MULTI W/SPECT W/WALL MOTION / EF
CHL CUP RESTING HR STRESS: 67 {beats}/min
CSEPPHR: 102 {beats}/min
LV dias vol: 69 mL (ref 46–106)
LVSYSVOL: 10 mL
RATE: 0.41
SDS: 1
SRS: 2
SSS: 3
TID: 0.86

## 2018-02-18 MED ORDER — TECHNETIUM TC 99M TETROFOSMIN IV KIT
10.0000 | PACK | Freq: Once | INTRAVENOUS | Status: AC | PRN
  Administered 2018-02-18: 10 via INTRAVENOUS

## 2018-02-18 MED ORDER — SODIUM CHLORIDE 0.9% FLUSH
INTRAVENOUS | Status: AC
  Administered 2018-02-18: 10 mL via INTRAVENOUS
  Filled 2018-02-18: qty 10

## 2018-02-18 MED ORDER — REGADENOSON 0.4 MG/5ML IV SOLN
INTRAVENOUS | Status: AC
  Administered 2018-02-18: 0.4 mg via INTRAVENOUS
  Filled 2018-02-18: qty 5

## 2018-02-18 MED ORDER — TECHNETIUM TC 99M TETROFOSMIN IV KIT
30.0000 | PACK | Freq: Once | INTRAVENOUS | Status: AC | PRN
  Administered 2018-02-18: 30 via INTRAVENOUS

## 2018-02-19 ENCOUNTER — Telehealth: Payer: Self-pay | Admitting: Cardiovascular Disease

## 2018-02-19 ENCOUNTER — Inpatient Hospital Stay: Payer: Medicare Other | Attending: Hematology and Oncology | Admitting: Hematology and Oncology

## 2018-02-19 ENCOUNTER — Telehealth: Payer: Self-pay

## 2018-02-19 ENCOUNTER — Telehealth: Payer: Self-pay | Admitting: Hematology and Oncology

## 2018-02-19 DIAGNOSIS — C50511 Malignant neoplasm of lower-outer quadrant of right female breast: Secondary | ICD-10-CM | POA: Diagnosis not present

## 2018-02-19 DIAGNOSIS — Z170 Estrogen receptor positive status [ER+]: Secondary | ICD-10-CM | POA: Insufficient documentation

## 2018-02-19 DIAGNOSIS — Z79811 Long term (current) use of aromatase inhibitors: Secondary | ICD-10-CM | POA: Diagnosis not present

## 2018-02-19 MED ORDER — ANASTROZOLE 1 MG PO TABS: 1.0000 mg | ORAL_TABLET | Freq: Every day | ORAL | 2 refills | Status: AC

## 2018-02-19 NOTE — Assessment & Plan Note (Signed)
Right breast invasive lobular carcinoma diagnosed July 1 2014status post lumpectomy 08/01/2013, 1.8 cm ILC, grade 1, 0/2 sentinel nodes, ER/PR positive, HER-2 negative, Ki-67 11%, T1 cN0 stage IA status post radiation by Dr. Orlene Erm in [place], on Arimidex 1 mg daily since 11/10/2013, patient could not come back to see [place] and was taking it every other day. Full dose resumed 08/24/2015  Breast cancer surveillance: 1. Breast exam 02/19/2018 is normal 3. Mammograms 11/21/2016: Normal with breast density category B  Anastrozole toxicities: 1. Occasional hot flashes 2. Mild to moderate muscle stiffness  Otherwise patient appears to be tolerating this Very well. Return to clinic in 1 year.

## 2018-02-19 NOTE — Progress Notes (Signed)
Patient Care Team: Glenda Chroman, MD as PCP - General (Internal Medicine)  DIAGNOSIS:  Encounter Diagnosis  Name Primary?  . Malignant neoplasm of lower-outer quadrant of right breast of female, estrogen receptor positive ([place])     SUMMARY OF ONCOLOGIC HISTORY:   Malignant neoplasm of lower-outer quadrant of right breast of female, estrogen receptor positive ([place])   06/10/2013 Initial Diagnosis    Invasive lobular cancer ER/PR positive HER-2 negative      08/01/2013 Surgery    Right lumpectomy: Invasive lobular cancer 1.8 cm, grade 1, margins clear, 0/2 lymph nodes, ER positive, PR positive, HER-2 negative, Ki-67 11%, T1 cN0 stage IA      09/15/2013 - 10/31/2013 Radiation Therapy    Adjuvant radiation therapy by Dr. Orlene Erm at [facility]      11/10/2013 -  Anti-estrogen oral therapy    Anastrozole 1 mg daily 5 years       CHIEF COMPLIANT: Follow-up on anastrozole therapy  INTERVAL HISTORY: Michele Palmer is a 82 year old with above-mentioned history of right breast cancer treated with lumpectomy radiation is currently on anastrozole.  She will complete 5 years of therapy by December 2019.  She turned 82 years old and she is very excited about it.  She had a huge party recently.  Denies any lumps or nodules in the breast.  REVIEW OF SYSTEMS:   Constitutional: Denies fevers, chills or abnormal weight loss Eyes: Denies blurriness of vision Ears, nose, mouth, throat, and face: Denies mucositis or sore throat Respiratory: Denies cough, dyspnea or wheezes Cardiovascular: Denies palpitation, chest discomfort Gastrointestinal:  Denies nausea, heartburn or change in bowel habits Skin: Denies abnormal skin rashes Lymphatics: Denies new lymphadenopathy or easy bruising Neurological:Denies numbness, tingling or new weaknesses Behavioral/Psych: Mood is stable, no new changes  Extremities: No lower extremity edema  All other systems were reviewed with the patient and are negative.  I  have reviewed the past medical history, past surgical history, social history and family history with the patient and they are unchanged from previous note.  ALLERGIES:  is allergic to amoxicillin; aspirin; norco [hydrocodone-acetaminophen]; penicillins; bactrim [sulfamethoxazole-trimethoprim]; ciprofloxacin; codeine; and sulfur.  MEDICATIONS:  Current Outpatient Medications  Medication Sig Dispense Refill  . acetaminophen (TYLENOL) 500 MG tablet Take 500 mg by mouth every 6 (six) hours as needed for mild pain or moderate pain.    Marland Kitchen anastrozole (ARIMIDEX) 1 MG tablet TAKE 1 TABLET BY MOUTH EVERY DAY 90 tablet 3  . citalopram (CELEXA) 10 MG tablet Take 10 mg by mouth at bedtime.     Marland Kitchen lisinopril (PRINIVIL,ZESTRIL) 10 MG tablet Take 10 mg by mouth daily.  3  . ranitidine (ZANTAC) 150 MG tablet Take 150 mg by mouth 2 (two) times daily as needed (acid reflux).      No current facility-administered medications for this visit.     PHYSICAL EXAMINATION: ECOG PERFORMANCE STATUS: 1 - Symptomatic but completely ambulatory  Vitals:   02/19/18 1020  BP: (!) 146/67  Pulse: 74  Resp: 17  Temp: 98.4 F (36.9 C)  SpO2: 97%   Filed Weights   02/19/18 1020  Weight: 234 lb 1.6 oz (106.2 kg)    GENERAL:alert, no distress and comfortable SKIN: skin color, texture, turgor are normal, no rashes or significant lesions EYES: normal, Conjunctiva are pink and non-injected, sclera clear OROPHARYNX:no exudate, no erythema and lips, buccal mucosa, and tongue normal  NECK: supple, thyroid normal size, non-tender, without nodularity LYMPH:  no palpable lymphadenopathy in the cervical, axillary  or inguinal LUNGS: clear to auscultation and percussion with normal breathing effort HEART: regular rate & rhythm and no murmurs and no lower extremity edema ABDOMEN:abdomen soft, non-tender and normal bowel sounds MUSCULOSKELETAL:no cyanosis of digits and no clubbing  NEURO: alert & oriented x 3 with fluent speech,  no focal motor/sensory deficits EXTREMITIES: No lower extremity edema BREAST: No palpable masses or nodules in either right or left breasts. No palpable axillary supraclavicular or infraclavicular adenopathy no breast tenderness or nipple discharge. (exam performed in the presence of a chaperone)  LABORATORY DATA:  I have reviewed the data as listed CMP Latest Ref Rng & Units 06/18/2017 03/21/2016 03/13/2016  Glucose 65 - 99 mg/dL 169(H) 115(H) 121(H)  BUN 6 - 20 mg/dL 27(H) 11 21(H)  Creatinine 0.44 - 1.00 mg/dL 0.76 0.65 0.74  Sodium 135 - 145 mmol/L 138 134(L) 139  Potassium 3.5 - 5.1 mmol/L 4.3 4.3 4.2  Chloride 101 - 111 mmol/L 102 100(L) 102  CO2 22 - 32 mmol/L 30 26 27   Calcium 8.9 - 10.3 mg/dL 9.4 8.8(L) 9.5  Total Protein 6.5 - 8.1 g/dL - - 6.9  Total Bilirubin 0.3 - 1.2 mg/dL - - 0.7  Alkaline Phos 38 - 126 U/L - - 93  AST 15 - 41 U/L - - 17  ALT 14 - 54 U/L - - 15    Lab Results  Component Value Date   WBC 7.8 06/18/2017   HGB 13.2 06/18/2017   HCT 40.3 06/18/2017   MCV 94.8 06/18/2017   PLT 193 06/18/2017   NEUTROABS 4.9 06/18/2017    ASSESSMENT & PLAN:  Malignant neoplasm of lower-outer quadrant of right breast of female, estrogen receptor positive ([place]) Right breast invasive lobular carcinoma diagnosed July 1 2014status post lumpectomy 08/01/2013, 1.8 cm ILC, grade 1, 0/2 sentinel nodes, ER/PR positive, HER-2 negative, Ki-67 11%, T1 cN0 stage IA status post radiation by Dr. Orlene Erm in [place], on Arimidex 1 mg daily since 11/10/2013, patient could not come back to see [place] and was taking it every other day. Full dose resumed 08/24/2015  Breast cancer surveillance: 1. Breast exam 02/19/2018 is normal 3. Mammograms  January 2019: Normal with breast density category B  Anastrozole toxicities: 1. Occasional hot flashes 2. Mild to moderate muscle stiffness  Otherwise patient appears to be tolerating this Very well. Return to clinic in 1 year for long-term survivorship  clinic with Mendel Ryder.  I spent 25 minutes talking to the patient of which more than half was spent in counseling and coordination of care.  No orders of the defined types were placed in this encounter.  The patient has a good understanding of the overall plan. she agrees with it. she will call with any problems that may develop before the next visit here.   Harriette Ohara, MD 02/19/18

## 2018-02-19 NOTE — Telephone Encounter (Signed)
Returned call for results 

## 2018-02-19 NOTE — Telephone Encounter (Signed)
Pt notified of normal nuc stress test

## 2018-02-19 NOTE — Telephone Encounter (Signed)
Gave avs and calendar ° °

## 2018-02-19 NOTE — Telephone Encounter (Signed)
Patient called to verify appointment for today. She is currently on the way. Per 3/26 phone message return

## 2018-02-21 ENCOUNTER — Encounter: Payer: Self-pay | Admitting: *Deleted

## 2018-04-29 ENCOUNTER — Ambulatory Visit: Payer: Medicare Other | Admitting: Cardiovascular Disease

## 2018-09-03 ENCOUNTER — Ambulatory Visit: Payer: Medicare Other | Admitting: Cardiovascular Disease

## 2019-02-20 ENCOUNTER — Encounter: Payer: Medicare Other | Admitting: Adult Health

## 2019-12-17 DIAGNOSIS — E119 Type 2 diabetes mellitus without complications: Secondary | ICD-10-CM | POA: Diagnosis not present

## 2019-12-17 DIAGNOSIS — I10 Essential (primary) hypertension: Secondary | ICD-10-CM | POA: Diagnosis not present

## 2019-12-17 DIAGNOSIS — E7800 Pure hypercholesterolemia, unspecified: Secondary | ICD-10-CM | POA: Diagnosis not present

## 2019-12-25 DIAGNOSIS — I10 Essential (primary) hypertension: Secondary | ICD-10-CM | POA: Diagnosis not present

## 2020-01-09 DIAGNOSIS — Z299 Encounter for prophylactic measures, unspecified: Secondary | ICD-10-CM | POA: Diagnosis not present

## 2020-01-09 DIAGNOSIS — I10 Essential (primary) hypertension: Secondary | ICD-10-CM | POA: Diagnosis not present

## 2020-01-09 DIAGNOSIS — Z789 Other specified health status: Secondary | ICD-10-CM | POA: Diagnosis not present

## 2020-01-09 DIAGNOSIS — E1165 Type 2 diabetes mellitus with hyperglycemia: Secondary | ICD-10-CM | POA: Diagnosis not present

## 2020-01-09 DIAGNOSIS — M79604 Pain in right leg: Secondary | ICD-10-CM | POA: Diagnosis not present

## 2020-01-09 DIAGNOSIS — M79661 Pain in right lower leg: Secondary | ICD-10-CM | POA: Diagnosis not present

## 2020-01-19 DIAGNOSIS — I639 Cerebral infarction, unspecified: Secondary | ICD-10-CM | POA: Diagnosis not present

## 2020-01-19 DIAGNOSIS — M6281 Muscle weakness (generalized): Secondary | ICD-10-CM | POA: Diagnosis not present

## 2020-01-19 DIAGNOSIS — I10 Essential (primary) hypertension: Secondary | ICD-10-CM | POA: Diagnosis not present

## 2020-01-19 DIAGNOSIS — I63232 Cerebral infarction due to unspecified occlusion or stenosis of left carotid arteries: Secondary | ICD-10-CM | POA: Diagnosis not present

## 2020-01-19 DIAGNOSIS — K219 Gastro-esophageal reflux disease without esophagitis: Secondary | ICD-10-CM | POA: Diagnosis not present

## 2020-01-19 DIAGNOSIS — R4781 Slurred speech: Secondary | ICD-10-CM | POA: Diagnosis not present

## 2020-01-19 DIAGNOSIS — R531 Weakness: Secondary | ICD-10-CM | POA: Diagnosis not present

## 2020-01-19 DIAGNOSIS — Z85828 Personal history of other malignant neoplasm of skin: Secondary | ICD-10-CM | POA: Diagnosis not present

## 2020-01-19 DIAGNOSIS — R29702 NIHSS score 2: Secondary | ICD-10-CM | POA: Diagnosis not present

## 2020-01-19 DIAGNOSIS — Z86711 Personal history of pulmonary embolism: Secondary | ICD-10-CM | POA: Diagnosis not present

## 2020-01-19 DIAGNOSIS — I7789 Other specified disorders of arteries and arterioles: Secondary | ICD-10-CM | POA: Diagnosis not present

## 2020-01-19 DIAGNOSIS — R9089 Other abnormal findings on diagnostic imaging of central nervous system: Secondary | ICD-10-CM | POA: Diagnosis not present

## 2020-01-19 DIAGNOSIS — Z743 Need for continuous supervision: Secondary | ICD-10-CM | POA: Diagnosis not present

## 2020-01-19 DIAGNOSIS — Z20822 Contact with and (suspected) exposure to covid-19: Secondary | ICD-10-CM | POA: Diagnosis not present

## 2020-01-19 DIAGNOSIS — C50911 Malignant neoplasm of unspecified site of right female breast: Secondary | ICD-10-CM | POA: Diagnosis not present

## 2020-01-19 DIAGNOSIS — I69351 Hemiplegia and hemiparesis following cerebral infarction affecting right dominant side: Secondary | ICD-10-CM | POA: Diagnosis not present

## 2020-01-19 DIAGNOSIS — G8191 Hemiplegia, unspecified affecting right dominant side: Secondary | ICD-10-CM | POA: Diagnosis not present

## 2020-01-19 DIAGNOSIS — I6521 Occlusion and stenosis of right carotid artery: Secondary | ICD-10-CM | POA: Diagnosis not present

## 2020-01-19 DIAGNOSIS — R450 Nervousness: Secondary | ICD-10-CM | POA: Diagnosis not present

## 2020-01-19 DIAGNOSIS — J9811 Atelectasis: Secondary | ICD-10-CM | POA: Diagnosis not present

## 2020-01-19 DIAGNOSIS — I6529 Occlusion and stenosis of unspecified carotid artery: Secondary | ICD-10-CM | POA: Diagnosis not present

## 2020-01-19 DIAGNOSIS — I6782 Cerebral ischemia: Secondary | ICD-10-CM | POA: Diagnosis not present

## 2020-01-19 DIAGNOSIS — W19XXXA Unspecified fall, initial encounter: Secondary | ICD-10-CM | POA: Diagnosis not present

## 2020-01-19 DIAGNOSIS — I700 Atherosclerosis of aorta: Secondary | ICD-10-CM | POA: Diagnosis not present

## 2020-01-19 DIAGNOSIS — I6381 Other cerebral infarction due to occlusion or stenosis of small artery: Secondary | ICD-10-CM | POA: Diagnosis not present

## 2020-01-19 DIAGNOSIS — I517 Cardiomegaly: Secondary | ICD-10-CM | POA: Diagnosis not present

## 2020-01-19 DIAGNOSIS — R2689 Other abnormalities of gait and mobility: Secondary | ICD-10-CM | POA: Diagnosis not present

## 2020-01-19 DIAGNOSIS — E119 Type 2 diabetes mellitus without complications: Secondary | ICD-10-CM | POA: Diagnosis not present

## 2020-01-20 DIAGNOSIS — I639 Cerebral infarction, unspecified: Secondary | ICD-10-CM | POA: Diagnosis not present

## 2020-01-20 DIAGNOSIS — I517 Cardiomegaly: Secondary | ICD-10-CM | POA: Diagnosis not present

## 2020-01-23 DIAGNOSIS — R4781 Slurred speech: Secondary | ICD-10-CM | POA: Diagnosis not present

## 2020-01-23 DIAGNOSIS — R2689 Other abnormalities of gait and mobility: Secondary | ICD-10-CM | POA: Diagnosis not present

## 2020-01-23 DIAGNOSIS — I6529 Occlusion and stenosis of unspecified carotid artery: Secondary | ICD-10-CM | POA: Diagnosis not present

## 2020-01-23 DIAGNOSIS — G8191 Hemiplegia, unspecified affecting right dominant side: Secondary | ICD-10-CM | POA: Diagnosis not present

## 2020-01-23 DIAGNOSIS — I6381 Other cerebral infarction due to occlusion or stenosis of small artery: Secondary | ICD-10-CM | POA: Diagnosis not present

## 2020-01-23 DIAGNOSIS — I10 Essential (primary) hypertension: Secondary | ICD-10-CM | POA: Diagnosis not present

## 2020-01-23 DIAGNOSIS — E119 Type 2 diabetes mellitus without complications: Secondary | ICD-10-CM | POA: Diagnosis not present

## 2020-01-23 DIAGNOSIS — M6281 Muscle weakness (generalized): Secondary | ICD-10-CM | POA: Diagnosis not present

## 2020-01-23 DIAGNOSIS — I69351 Hemiplegia and hemiparesis following cerebral infarction affecting right dominant side: Secondary | ICD-10-CM | POA: Diagnosis not present

## 2020-01-23 DIAGNOSIS — K219 Gastro-esophageal reflux disease without esophagitis: Secondary | ICD-10-CM | POA: Diagnosis not present

## 2020-01-23 DIAGNOSIS — I700 Atherosclerosis of aorta: Secondary | ICD-10-CM | POA: Diagnosis not present

## 2020-01-25 DIAGNOSIS — I10 Essential (primary) hypertension: Secondary | ICD-10-CM | POA: Diagnosis not present

## 2020-01-26 DIAGNOSIS — I6381 Other cerebral infarction due to occlusion or stenosis of small artery: Secondary | ICD-10-CM | POA: Diagnosis not present

## 2020-01-26 DIAGNOSIS — I6529 Occlusion and stenosis of unspecified carotid artery: Secondary | ICD-10-CM | POA: Diagnosis not present

## 2020-01-26 DIAGNOSIS — M6281 Muscle weakness (generalized): Secondary | ICD-10-CM | POA: Diagnosis not present

## 2020-01-26 DIAGNOSIS — I700 Atherosclerosis of aorta: Secondary | ICD-10-CM | POA: Diagnosis not present

## 2020-01-31 DIAGNOSIS — I6529 Occlusion and stenosis of unspecified carotid artery: Secondary | ICD-10-CM | POA: Diagnosis not present

## 2020-01-31 DIAGNOSIS — I700 Atherosclerosis of aorta: Secondary | ICD-10-CM | POA: Diagnosis not present

## 2020-01-31 DIAGNOSIS — M6281 Muscle weakness (generalized): Secondary | ICD-10-CM | POA: Diagnosis not present

## 2020-01-31 DIAGNOSIS — I6381 Other cerebral infarction due to occlusion or stenosis of small artery: Secondary | ICD-10-CM | POA: Diagnosis not present

## 2020-02-02 DIAGNOSIS — Z9181 History of falling: Secondary | ICD-10-CM | POA: Diagnosis not present

## 2020-02-02 DIAGNOSIS — K219 Gastro-esophageal reflux disease without esophagitis: Secondary | ICD-10-CM | POA: Diagnosis not present

## 2020-02-02 DIAGNOSIS — I10 Essential (primary) hypertension: Secondary | ICD-10-CM | POA: Diagnosis not present

## 2020-02-02 DIAGNOSIS — E785 Hyperlipidemia, unspecified: Secondary | ICD-10-CM | POA: Diagnosis not present

## 2020-02-02 DIAGNOSIS — I69351 Hemiplegia and hemiparesis following cerebral infarction affecting right dominant side: Secondary | ICD-10-CM | POA: Diagnosis not present

## 2020-02-02 DIAGNOSIS — E119 Type 2 diabetes mellitus without complications: Secondary | ICD-10-CM | POA: Diagnosis not present

## 2020-02-02 DIAGNOSIS — Z7982 Long term (current) use of aspirin: Secondary | ICD-10-CM | POA: Diagnosis not present

## 2020-02-04 DIAGNOSIS — K219 Gastro-esophageal reflux disease without esophagitis: Secondary | ICD-10-CM | POA: Diagnosis not present

## 2020-02-04 DIAGNOSIS — E119 Type 2 diabetes mellitus without complications: Secondary | ICD-10-CM | POA: Diagnosis not present

## 2020-02-04 DIAGNOSIS — I10 Essential (primary) hypertension: Secondary | ICD-10-CM | POA: Diagnosis not present

## 2020-02-04 DIAGNOSIS — I69351 Hemiplegia and hemiparesis following cerebral infarction affecting right dominant side: Secondary | ICD-10-CM | POA: Diagnosis not present

## 2020-02-04 DIAGNOSIS — Z9181 History of falling: Secondary | ICD-10-CM | POA: Diagnosis not present

## 2020-02-04 DIAGNOSIS — Z7982 Long term (current) use of aspirin: Secondary | ICD-10-CM | POA: Diagnosis not present

## 2020-02-04 DIAGNOSIS — E785 Hyperlipidemia, unspecified: Secondary | ICD-10-CM | POA: Diagnosis not present

## 2020-02-05 DIAGNOSIS — I10 Essential (primary) hypertension: Secondary | ICD-10-CM | POA: Diagnosis not present

## 2020-02-05 DIAGNOSIS — I69351 Hemiplegia and hemiparesis following cerebral infarction affecting right dominant side: Secondary | ICD-10-CM | POA: Diagnosis not present

## 2020-02-05 DIAGNOSIS — K219 Gastro-esophageal reflux disease without esophagitis: Secondary | ICD-10-CM | POA: Diagnosis not present

## 2020-02-05 DIAGNOSIS — E119 Type 2 diabetes mellitus without complications: Secondary | ICD-10-CM | POA: Diagnosis not present

## 2020-02-05 DIAGNOSIS — Z7982 Long term (current) use of aspirin: Secondary | ICD-10-CM | POA: Diagnosis not present

## 2020-02-05 DIAGNOSIS — E785 Hyperlipidemia, unspecified: Secondary | ICD-10-CM | POA: Diagnosis not present

## 2020-02-05 DIAGNOSIS — Z9181 History of falling: Secondary | ICD-10-CM | POA: Diagnosis not present

## 2020-02-06 DIAGNOSIS — Z299 Encounter for prophylactic measures, unspecified: Secondary | ICD-10-CM | POA: Diagnosis not present

## 2020-02-06 DIAGNOSIS — I10 Essential (primary) hypertension: Secondary | ICD-10-CM | POA: Diagnosis not present

## 2020-02-06 DIAGNOSIS — Z9181 History of falling: Secondary | ICD-10-CM | POA: Diagnosis not present

## 2020-02-06 DIAGNOSIS — K219 Gastro-esophageal reflux disease without esophagitis: Secondary | ICD-10-CM | POA: Diagnosis not present

## 2020-02-06 DIAGNOSIS — I779 Disorder of arteries and arterioles, unspecified: Secondary | ICD-10-CM | POA: Diagnosis not present

## 2020-02-06 DIAGNOSIS — I69351 Hemiplegia and hemiparesis following cerebral infarction affecting right dominant side: Secondary | ICD-10-CM | POA: Diagnosis not present

## 2020-02-06 DIAGNOSIS — E119 Type 2 diabetes mellitus without complications: Secondary | ICD-10-CM | POA: Diagnosis not present

## 2020-02-06 DIAGNOSIS — Z7982 Long term (current) use of aspirin: Secondary | ICD-10-CM | POA: Diagnosis not present

## 2020-02-06 DIAGNOSIS — E785 Hyperlipidemia, unspecified: Secondary | ICD-10-CM | POA: Diagnosis not present

## 2020-02-09 DIAGNOSIS — K219 Gastro-esophageal reflux disease without esophagitis: Secondary | ICD-10-CM | POA: Diagnosis not present

## 2020-02-09 DIAGNOSIS — I69351 Hemiplegia and hemiparesis following cerebral infarction affecting right dominant side: Secondary | ICD-10-CM | POA: Diagnosis not present

## 2020-02-09 DIAGNOSIS — I10 Essential (primary) hypertension: Secondary | ICD-10-CM | POA: Diagnosis not present

## 2020-02-09 DIAGNOSIS — E785 Hyperlipidemia, unspecified: Secondary | ICD-10-CM | POA: Diagnosis not present

## 2020-02-09 DIAGNOSIS — Z9181 History of falling: Secondary | ICD-10-CM | POA: Diagnosis not present

## 2020-02-09 DIAGNOSIS — E119 Type 2 diabetes mellitus without complications: Secondary | ICD-10-CM | POA: Diagnosis not present

## 2020-02-09 DIAGNOSIS — Z7982 Long term (current) use of aspirin: Secondary | ICD-10-CM | POA: Diagnosis not present

## 2020-02-10 DIAGNOSIS — I10 Essential (primary) hypertension: Secondary | ICD-10-CM | POA: Diagnosis not present

## 2020-02-10 DIAGNOSIS — Z7982 Long term (current) use of aspirin: Secondary | ICD-10-CM | POA: Diagnosis not present

## 2020-02-10 DIAGNOSIS — Z9181 History of falling: Secondary | ICD-10-CM | POA: Diagnosis not present

## 2020-02-10 DIAGNOSIS — I69351 Hemiplegia and hemiparesis following cerebral infarction affecting right dominant side: Secondary | ICD-10-CM | POA: Diagnosis not present

## 2020-02-10 DIAGNOSIS — E119 Type 2 diabetes mellitus without complications: Secondary | ICD-10-CM | POA: Diagnosis not present

## 2020-02-10 DIAGNOSIS — E785 Hyperlipidemia, unspecified: Secondary | ICD-10-CM | POA: Diagnosis not present

## 2020-02-10 DIAGNOSIS — K219 Gastro-esophageal reflux disease without esophagitis: Secondary | ICD-10-CM | POA: Diagnosis not present

## 2020-02-12 DIAGNOSIS — I10 Essential (primary) hypertension: Secondary | ICD-10-CM | POA: Diagnosis not present

## 2020-02-12 DIAGNOSIS — Z9181 History of falling: Secondary | ICD-10-CM | POA: Diagnosis not present

## 2020-02-12 DIAGNOSIS — E785 Hyperlipidemia, unspecified: Secondary | ICD-10-CM | POA: Diagnosis not present

## 2020-02-12 DIAGNOSIS — I69351 Hemiplegia and hemiparesis following cerebral infarction affecting right dominant side: Secondary | ICD-10-CM | POA: Diagnosis not present

## 2020-02-12 DIAGNOSIS — Z7982 Long term (current) use of aspirin: Secondary | ICD-10-CM | POA: Diagnosis not present

## 2020-02-12 DIAGNOSIS — E119 Type 2 diabetes mellitus without complications: Secondary | ICD-10-CM | POA: Diagnosis not present

## 2020-02-12 DIAGNOSIS — K219 Gastro-esophageal reflux disease without esophagitis: Secondary | ICD-10-CM | POA: Diagnosis not present

## 2020-02-13 DIAGNOSIS — Z7982 Long term (current) use of aspirin: Secondary | ICD-10-CM | POA: Diagnosis not present

## 2020-02-13 DIAGNOSIS — K219 Gastro-esophageal reflux disease without esophagitis: Secondary | ICD-10-CM | POA: Diagnosis not present

## 2020-02-13 DIAGNOSIS — E785 Hyperlipidemia, unspecified: Secondary | ICD-10-CM | POA: Diagnosis not present

## 2020-02-13 DIAGNOSIS — I69351 Hemiplegia and hemiparesis following cerebral infarction affecting right dominant side: Secondary | ICD-10-CM | POA: Diagnosis not present

## 2020-02-13 DIAGNOSIS — Z9181 History of falling: Secondary | ICD-10-CM | POA: Diagnosis not present

## 2020-02-13 DIAGNOSIS — E119 Type 2 diabetes mellitus without complications: Secondary | ICD-10-CM | POA: Diagnosis not present

## 2020-02-13 DIAGNOSIS — I10 Essential (primary) hypertension: Secondary | ICD-10-CM | POA: Diagnosis not present

## 2020-02-16 DIAGNOSIS — E119 Type 2 diabetes mellitus without complications: Secondary | ICD-10-CM | POA: Diagnosis not present

## 2020-02-16 DIAGNOSIS — Z7982 Long term (current) use of aspirin: Secondary | ICD-10-CM | POA: Diagnosis not present

## 2020-02-16 DIAGNOSIS — I69351 Hemiplegia and hemiparesis following cerebral infarction affecting right dominant side: Secondary | ICD-10-CM | POA: Diagnosis not present

## 2020-02-16 DIAGNOSIS — I10 Essential (primary) hypertension: Secondary | ICD-10-CM | POA: Diagnosis not present

## 2020-02-16 DIAGNOSIS — K219 Gastro-esophageal reflux disease without esophagitis: Secondary | ICD-10-CM | POA: Diagnosis not present

## 2020-02-16 DIAGNOSIS — Z9181 History of falling: Secondary | ICD-10-CM | POA: Diagnosis not present

## 2020-02-16 DIAGNOSIS — E785 Hyperlipidemia, unspecified: Secondary | ICD-10-CM | POA: Diagnosis not present

## 2020-02-17 DIAGNOSIS — E785 Hyperlipidemia, unspecified: Secondary | ICD-10-CM | POA: Diagnosis not present

## 2020-02-17 DIAGNOSIS — K219 Gastro-esophageal reflux disease without esophagitis: Secondary | ICD-10-CM | POA: Diagnosis not present

## 2020-02-17 DIAGNOSIS — E119 Type 2 diabetes mellitus without complications: Secondary | ICD-10-CM | POA: Diagnosis not present

## 2020-02-17 DIAGNOSIS — Z7982 Long term (current) use of aspirin: Secondary | ICD-10-CM | POA: Diagnosis not present

## 2020-02-17 DIAGNOSIS — I10 Essential (primary) hypertension: Secondary | ICD-10-CM | POA: Diagnosis not present

## 2020-02-17 DIAGNOSIS — Z9181 History of falling: Secondary | ICD-10-CM | POA: Diagnosis not present

## 2020-02-17 DIAGNOSIS — I69351 Hemiplegia and hemiparesis following cerebral infarction affecting right dominant side: Secondary | ICD-10-CM | POA: Diagnosis not present

## 2020-02-18 DIAGNOSIS — I771 Stricture of artery: Secondary | ICD-10-CM | POA: Diagnosis not present

## 2020-02-18 DIAGNOSIS — I700 Atherosclerosis of aorta: Secondary | ICD-10-CM | POA: Diagnosis not present

## 2020-02-18 DIAGNOSIS — K8020 Calculus of gallbladder without cholecystitis without obstruction: Secondary | ICD-10-CM | POA: Diagnosis not present

## 2020-02-18 DIAGNOSIS — I708 Atherosclerosis of other arteries: Secondary | ICD-10-CM | POA: Diagnosis not present

## 2020-02-19 DIAGNOSIS — K219 Gastro-esophageal reflux disease without esophagitis: Secondary | ICD-10-CM | POA: Diagnosis not present

## 2020-02-19 DIAGNOSIS — I10 Essential (primary) hypertension: Secondary | ICD-10-CM | POA: Diagnosis not present

## 2020-02-19 DIAGNOSIS — Z9181 History of falling: Secondary | ICD-10-CM | POA: Diagnosis not present

## 2020-02-19 DIAGNOSIS — E119 Type 2 diabetes mellitus without complications: Secondary | ICD-10-CM | POA: Diagnosis not present

## 2020-02-19 DIAGNOSIS — I69351 Hemiplegia and hemiparesis following cerebral infarction affecting right dominant side: Secondary | ICD-10-CM | POA: Diagnosis not present

## 2020-02-19 DIAGNOSIS — E785 Hyperlipidemia, unspecified: Secondary | ICD-10-CM | POA: Diagnosis not present

## 2020-02-19 DIAGNOSIS — Z7982 Long term (current) use of aspirin: Secondary | ICD-10-CM | POA: Diagnosis not present

## 2020-02-22 DIAGNOSIS — I69351 Hemiplegia and hemiparesis following cerebral infarction affecting right dominant side: Secondary | ICD-10-CM | POA: Diagnosis not present

## 2020-02-23 DIAGNOSIS — E785 Hyperlipidemia, unspecified: Secondary | ICD-10-CM | POA: Diagnosis not present

## 2020-02-23 DIAGNOSIS — Z9181 History of falling: Secondary | ICD-10-CM | POA: Diagnosis not present

## 2020-02-23 DIAGNOSIS — E119 Type 2 diabetes mellitus without complications: Secondary | ICD-10-CM | POA: Diagnosis not present

## 2020-02-23 DIAGNOSIS — I10 Essential (primary) hypertension: Secondary | ICD-10-CM | POA: Diagnosis not present

## 2020-02-23 DIAGNOSIS — Z7982 Long term (current) use of aspirin: Secondary | ICD-10-CM | POA: Diagnosis not present

## 2020-02-23 DIAGNOSIS — I69351 Hemiplegia and hemiparesis following cerebral infarction affecting right dominant side: Secondary | ICD-10-CM | POA: Diagnosis not present

## 2020-02-23 DIAGNOSIS — K219 Gastro-esophageal reflux disease without esophagitis: Secondary | ICD-10-CM | POA: Diagnosis not present

## 2020-02-24 DIAGNOSIS — I10 Essential (primary) hypertension: Secondary | ICD-10-CM | POA: Diagnosis not present

## 2020-02-26 DIAGNOSIS — I10 Essential (primary) hypertension: Secondary | ICD-10-CM | POA: Diagnosis not present

## 2020-02-26 DIAGNOSIS — I69351 Hemiplegia and hemiparesis following cerebral infarction affecting right dominant side: Secondary | ICD-10-CM | POA: Diagnosis not present

## 2020-02-26 DIAGNOSIS — E785 Hyperlipidemia, unspecified: Secondary | ICD-10-CM | POA: Diagnosis not present

## 2020-02-26 DIAGNOSIS — Z9181 History of falling: Secondary | ICD-10-CM | POA: Diagnosis not present

## 2020-02-26 DIAGNOSIS — E119 Type 2 diabetes mellitus without complications: Secondary | ICD-10-CM | POA: Diagnosis not present

## 2020-02-26 DIAGNOSIS — K219 Gastro-esophageal reflux disease without esophagitis: Secondary | ICD-10-CM | POA: Diagnosis not present

## 2020-02-26 DIAGNOSIS — Z7982 Long term (current) use of aspirin: Secondary | ICD-10-CM | POA: Diagnosis not present

## 2020-03-12 DIAGNOSIS — I10 Essential (primary) hypertension: Secondary | ICD-10-CM | POA: Diagnosis not present

## 2020-03-12 DIAGNOSIS — Z299 Encounter for prophylactic measures, unspecified: Secondary | ICD-10-CM | POA: Diagnosis not present

## 2020-03-12 DIAGNOSIS — H9202 Otalgia, left ear: Secondary | ICD-10-CM | POA: Diagnosis not present

## 2020-03-12 DIAGNOSIS — Z713 Dietary counseling and surveillance: Secondary | ICD-10-CM | POA: Diagnosis not present

## 2020-03-17 DIAGNOSIS — I10 Essential (primary) hypertension: Secondary | ICD-10-CM | POA: Diagnosis not present

## 2020-03-17 DIAGNOSIS — E1165 Type 2 diabetes mellitus with hyperglycemia: Secondary | ICD-10-CM | POA: Diagnosis not present

## 2020-03-17 DIAGNOSIS — Z299 Encounter for prophylactic measures, unspecified: Secondary | ICD-10-CM | POA: Diagnosis not present

## 2020-03-17 DIAGNOSIS — Z0000 Encounter for general adult medical examination without abnormal findings: Secondary | ICD-10-CM | POA: Diagnosis not present

## 2020-03-17 DIAGNOSIS — Z7189 Other specified counseling: Secondary | ICD-10-CM | POA: Diagnosis not present

## 2020-03-17 DIAGNOSIS — Z1211 Encounter for screening for malignant neoplasm of colon: Secondary | ICD-10-CM | POA: Diagnosis not present

## 2020-03-18 DIAGNOSIS — Z79899 Other long term (current) drug therapy: Secondary | ICD-10-CM | POA: Diagnosis not present

## 2020-03-18 DIAGNOSIS — R5383 Other fatigue: Secondary | ICD-10-CM | POA: Diagnosis not present

## 2020-03-18 DIAGNOSIS — E7800 Pure hypercholesterolemia, unspecified: Secondary | ICD-10-CM | POA: Diagnosis not present

## 2020-03-22 DIAGNOSIS — E119 Type 2 diabetes mellitus without complications: Secondary | ICD-10-CM | POA: Diagnosis not present

## 2020-03-22 DIAGNOSIS — E7800 Pure hypercholesterolemia, unspecified: Secondary | ICD-10-CM | POA: Diagnosis not present

## 2020-03-22 DIAGNOSIS — I10 Essential (primary) hypertension: Secondary | ICD-10-CM | POA: Diagnosis not present

## 2020-03-26 DIAGNOSIS — I10 Essential (primary) hypertension: Secondary | ICD-10-CM | POA: Diagnosis not present

## 2020-04-25 DIAGNOSIS — E7800 Pure hypercholesterolemia, unspecified: Secondary | ICD-10-CM | POA: Diagnosis not present

## 2020-04-25 DIAGNOSIS — E119 Type 2 diabetes mellitus without complications: Secondary | ICD-10-CM | POA: Diagnosis not present

## 2020-04-25 DIAGNOSIS — I10 Essential (primary) hypertension: Secondary | ICD-10-CM | POA: Diagnosis not present

## 2020-05-05 ENCOUNTER — Other Ambulatory Visit: Payer: Self-pay | Admitting: Internal Medicine

## 2020-05-05 DIAGNOSIS — Z1231 Encounter for screening mammogram for malignant neoplasm of breast: Secondary | ICD-10-CM

## 2020-05-26 DIAGNOSIS — E7800 Pure hypercholesterolemia, unspecified: Secondary | ICD-10-CM | POA: Diagnosis not present

## 2020-05-26 DIAGNOSIS — E119 Type 2 diabetes mellitus without complications: Secondary | ICD-10-CM | POA: Diagnosis not present

## 2020-05-26 DIAGNOSIS — I10 Essential (primary) hypertension: Secondary | ICD-10-CM | POA: Diagnosis not present

## 2020-06-04 ENCOUNTER — Ambulatory Visit
Admission: RE | Admit: 2020-06-04 | Discharge: 2020-06-04 | Disposition: A | Discharge status: Home / Self Care | Payer: Medicare Other | Source: Ambulatory Visit | Attending: Internal Medicine | Admitting: Internal Medicine

## 2020-06-04 ENCOUNTER — Other Ambulatory Visit: Payer: Self-pay

## 2020-06-04 DIAGNOSIS — Z1231 Encounter for screening mammogram for malignant neoplasm of breast: Secondary | ICD-10-CM

## 2020-06-24 DIAGNOSIS — I771 Stricture of artery: Secondary | ICD-10-CM | POA: Diagnosis not present

## 2020-06-24 DIAGNOSIS — E1165 Type 2 diabetes mellitus with hyperglycemia: Secondary | ICD-10-CM | POA: Diagnosis not present

## 2020-06-24 DIAGNOSIS — Z299 Encounter for prophylactic measures, unspecified: Secondary | ICD-10-CM | POA: Diagnosis not present

## 2020-06-24 DIAGNOSIS — I10 Essential (primary) hypertension: Secondary | ICD-10-CM | POA: Diagnosis not present

## 2020-06-24 DIAGNOSIS — I779 Disorder of arteries and arterioles, unspecified: Secondary | ICD-10-CM | POA: Diagnosis not present

## 2020-06-25 DIAGNOSIS — E119 Type 2 diabetes mellitus without complications: Secondary | ICD-10-CM | POA: Diagnosis not present

## 2020-06-25 DIAGNOSIS — I10 Essential (primary) hypertension: Secondary | ICD-10-CM | POA: Diagnosis not present

## 2020-06-25 DIAGNOSIS — E7800 Pure hypercholesterolemia, unspecified: Secondary | ICD-10-CM | POA: Diagnosis not present

## 2020-06-29 DIAGNOSIS — E1159 Type 2 diabetes mellitus with other circulatory complications: Secondary | ICD-10-CM | POA: Diagnosis not present

## 2020-06-29 DIAGNOSIS — E1143 Type 2 diabetes mellitus with diabetic autonomic (poly)neuropathy: Secondary | ICD-10-CM | POA: Diagnosis not present

## 2020-07-13 DIAGNOSIS — I10 Essential (primary) hypertension: Secondary | ICD-10-CM | POA: Diagnosis not present

## 2020-07-13 DIAGNOSIS — E119 Type 2 diabetes mellitus without complications: Secondary | ICD-10-CM | POA: Diagnosis not present

## 2020-07-13 DIAGNOSIS — E7800 Pure hypercholesterolemia, unspecified: Secondary | ICD-10-CM | POA: Diagnosis not present

## 2020-07-27 DIAGNOSIS — I10 Essential (primary) hypertension: Secondary | ICD-10-CM | POA: Diagnosis not present

## 2020-07-28 DIAGNOSIS — Z299 Encounter for prophylactic measures, unspecified: Secondary | ICD-10-CM | POA: Diagnosis not present

## 2020-07-28 DIAGNOSIS — E1165 Type 2 diabetes mellitus with hyperglycemia: Secondary | ICD-10-CM | POA: Diagnosis not present

## 2020-07-28 DIAGNOSIS — I771 Stricture of artery: Secondary | ICD-10-CM | POA: Diagnosis not present

## 2020-07-28 DIAGNOSIS — I10 Essential (primary) hypertension: Secondary | ICD-10-CM | POA: Diagnosis not present

## 2020-07-28 DIAGNOSIS — I272 Pulmonary hypertension, unspecified: Secondary | ICD-10-CM | POA: Diagnosis not present

## 2020-07-28 DIAGNOSIS — I779 Disorder of arteries and arterioles, unspecified: Secondary | ICD-10-CM | POA: Diagnosis not present

## 2020-08-26 DIAGNOSIS — E7800 Pure hypercholesterolemia, unspecified: Secondary | ICD-10-CM | POA: Diagnosis not present

## 2020-08-26 DIAGNOSIS — E119 Type 2 diabetes mellitus without complications: Secondary | ICD-10-CM | POA: Diagnosis not present

## 2020-08-26 DIAGNOSIS — I10 Essential (primary) hypertension: Secondary | ICD-10-CM | POA: Diagnosis not present

## 2020-09-01 DIAGNOSIS — Z299 Encounter for prophylactic measures, unspecified: Secondary | ICD-10-CM | POA: Diagnosis not present

## 2020-09-01 DIAGNOSIS — M7521 Bicipital tendinitis, right shoulder: Secondary | ICD-10-CM | POA: Diagnosis not present

## 2020-09-01 DIAGNOSIS — I10 Essential (primary) hypertension: Secondary | ICD-10-CM | POA: Diagnosis not present

## 2020-09-01 DIAGNOSIS — I69351 Hemiplegia and hemiparesis following cerebral infarction affecting right dominant side: Secondary | ICD-10-CM | POA: Diagnosis not present

## 2020-09-01 DIAGNOSIS — G8191 Hemiplegia, unspecified affecting right dominant side: Secondary | ICD-10-CM | POA: Diagnosis not present

## 2020-09-24 DIAGNOSIS — E7800 Pure hypercholesterolemia, unspecified: Secondary | ICD-10-CM | POA: Diagnosis not present

## 2020-09-24 DIAGNOSIS — I10 Essential (primary) hypertension: Secondary | ICD-10-CM | POA: Diagnosis not present

## 2020-09-24 DIAGNOSIS — E119 Type 2 diabetes mellitus without complications: Secondary | ICD-10-CM | POA: Diagnosis not present

## 2020-09-25 DIAGNOSIS — I10 Essential (primary) hypertension: Secondary | ICD-10-CM | POA: Diagnosis not present

## 2020-10-04 DIAGNOSIS — I10 Essential (primary) hypertension: Secondary | ICD-10-CM | POA: Diagnosis not present

## 2020-10-04 DIAGNOSIS — I779 Disorder of arteries and arterioles, unspecified: Secondary | ICD-10-CM | POA: Diagnosis not present

## 2020-10-04 DIAGNOSIS — I272 Pulmonary hypertension, unspecified: Secondary | ICD-10-CM | POA: Diagnosis not present

## 2020-10-04 DIAGNOSIS — Z299 Encounter for prophylactic measures, unspecified: Secondary | ICD-10-CM | POA: Diagnosis not present

## 2020-10-04 DIAGNOSIS — E1165 Type 2 diabetes mellitus with hyperglycemia: Secondary | ICD-10-CM | POA: Diagnosis not present

## 2020-10-26 DIAGNOSIS — E7800 Pure hypercholesterolemia, unspecified: Secondary | ICD-10-CM | POA: Diagnosis not present

## 2020-10-26 DIAGNOSIS — E119 Type 2 diabetes mellitus without complications: Secondary | ICD-10-CM | POA: Diagnosis not present

## 2020-10-26 DIAGNOSIS — I10 Essential (primary) hypertension: Secondary | ICD-10-CM | POA: Diagnosis not present

## 2020-11-25 DIAGNOSIS — I10 Essential (primary) hypertension: Secondary | ICD-10-CM | POA: Diagnosis not present

## 2020-11-25 DIAGNOSIS — E119 Type 2 diabetes mellitus without complications: Secondary | ICD-10-CM | POA: Diagnosis not present

## 2020-11-25 DIAGNOSIS — E7800 Pure hypercholesterolemia, unspecified: Secondary | ICD-10-CM | POA: Diagnosis not present

## 2020-12-27 DIAGNOSIS — K219 Gastro-esophageal reflux disease without esophagitis: Secondary | ICD-10-CM | POA: Diagnosis not present

## 2020-12-27 DIAGNOSIS — F32A Depression, unspecified: Secondary | ICD-10-CM | POA: Diagnosis not present

## 2020-12-27 DIAGNOSIS — I10 Essential (primary) hypertension: Secondary | ICD-10-CM | POA: Diagnosis not present

## 2021-01-06 DIAGNOSIS — Z961 Presence of intraocular lens: Secondary | ICD-10-CM | POA: Diagnosis not present

## 2021-01-06 DIAGNOSIS — H524 Presbyopia: Secondary | ICD-10-CM | POA: Diagnosis not present

## 2021-01-06 DIAGNOSIS — E119 Type 2 diabetes mellitus without complications: Secondary | ICD-10-CM | POA: Diagnosis not present

## 2021-01-06 DIAGNOSIS — Z7984 Long term (current) use of oral hypoglycemic drugs: Secondary | ICD-10-CM | POA: Diagnosis not present

## 2021-01-17 DIAGNOSIS — I779 Disorder of arteries and arterioles, unspecified: Secondary | ICD-10-CM | POA: Diagnosis not present

## 2021-01-17 DIAGNOSIS — E1165 Type 2 diabetes mellitus with hyperglycemia: Secondary | ICD-10-CM | POA: Diagnosis not present

## 2021-01-17 DIAGNOSIS — I771 Stricture of artery: Secondary | ICD-10-CM | POA: Diagnosis not present

## 2021-01-17 DIAGNOSIS — I700 Atherosclerosis of aorta: Secondary | ICD-10-CM | POA: Diagnosis not present

## 2021-01-17 DIAGNOSIS — Z299 Encounter for prophylactic measures, unspecified: Secondary | ICD-10-CM | POA: Diagnosis not present

## 2021-01-17 DIAGNOSIS — I10 Essential (primary) hypertension: Secondary | ICD-10-CM | POA: Diagnosis not present

## 2021-01-24 DIAGNOSIS — I10 Essential (primary) hypertension: Secondary | ICD-10-CM | POA: Diagnosis not present

## 2021-02-14 DIAGNOSIS — I10 Essential (primary) hypertension: Secondary | ICD-10-CM | POA: Diagnosis not present

## 2021-02-14 DIAGNOSIS — Z299 Encounter for prophylactic measures, unspecified: Secondary | ICD-10-CM | POA: Diagnosis not present

## 2021-02-14 DIAGNOSIS — H6982 Other specified disorders of Eustachian tube, left ear: Secondary | ICD-10-CM | POA: Diagnosis not present

## 2021-02-14 DIAGNOSIS — H9202 Otalgia, left ear: Secondary | ICD-10-CM | POA: Diagnosis not present

## 2021-02-24 DIAGNOSIS — I10 Essential (primary) hypertension: Secondary | ICD-10-CM | POA: Diagnosis not present

## 2021-03-25 DIAGNOSIS — I10 Essential (primary) hypertension: Secondary | ICD-10-CM | POA: Diagnosis not present

## 2021-03-30 DIAGNOSIS — S0990XA Unspecified injury of head, initial encounter: Secondary | ICD-10-CM | POA: Diagnosis not present

## 2021-03-30 DIAGNOSIS — I6529 Occlusion and stenosis of unspecified carotid artery: Secondary | ICD-10-CM | POA: Diagnosis not present

## 2021-03-30 DIAGNOSIS — W1839XA Other fall on same level, initial encounter: Secondary | ICD-10-CM | POA: Diagnosis not present

## 2021-03-30 DIAGNOSIS — I6381 Other cerebral infarction due to occlusion or stenosis of small artery: Secondary | ICD-10-CM | POA: Diagnosis not present

## 2021-03-30 DIAGNOSIS — I672 Cerebral atherosclerosis: Secondary | ICD-10-CM | POA: Diagnosis not present

## 2021-03-30 DIAGNOSIS — M7989 Other specified soft tissue disorders: Secondary | ICD-10-CM | POA: Diagnosis not present

## 2021-03-30 DIAGNOSIS — Z9049 Acquired absence of other specified parts of digestive tract: Secondary | ICD-10-CM | POA: Diagnosis not present

## 2021-03-30 DIAGNOSIS — S0003XA Contusion of scalp, initial encounter: Secondary | ICD-10-CM | POA: Diagnosis not present

## 2021-04-11 DIAGNOSIS — Z299 Encounter for prophylactic measures, unspecified: Secondary | ICD-10-CM | POA: Diagnosis not present

## 2021-04-11 DIAGNOSIS — Z789 Other specified health status: Secondary | ICD-10-CM | POA: Diagnosis not present

## 2021-04-11 DIAGNOSIS — J069 Acute upper respiratory infection, unspecified: Secondary | ICD-10-CM | POA: Diagnosis not present

## 2021-04-11 DIAGNOSIS — I10 Essential (primary) hypertension: Secondary | ICD-10-CM | POA: Diagnosis not present

## 2021-04-22 DIAGNOSIS — Z299 Encounter for prophylactic measures, unspecified: Secondary | ICD-10-CM | POA: Diagnosis not present

## 2021-04-22 DIAGNOSIS — I779 Disorder of arteries and arterioles, unspecified: Secondary | ICD-10-CM | POA: Diagnosis not present

## 2021-04-22 DIAGNOSIS — E1129 Type 2 diabetes mellitus with other diabetic kidney complication: Secondary | ICD-10-CM | POA: Diagnosis not present

## 2021-04-22 DIAGNOSIS — R809 Proteinuria, unspecified: Secondary | ICD-10-CM | POA: Diagnosis not present

## 2021-04-22 DIAGNOSIS — I10 Essential (primary) hypertension: Secondary | ICD-10-CM | POA: Diagnosis not present

## 2021-04-22 DIAGNOSIS — E1165 Type 2 diabetes mellitus with hyperglycemia: Secondary | ICD-10-CM | POA: Diagnosis not present

## 2021-04-25 DIAGNOSIS — E7800 Pure hypercholesterolemia, unspecified: Secondary | ICD-10-CM | POA: Diagnosis not present

## 2021-04-25 DIAGNOSIS — I10 Essential (primary) hypertension: Secondary | ICD-10-CM | POA: Diagnosis not present

## 2021-04-26 DIAGNOSIS — I10 Essential (primary) hypertension: Secondary | ICD-10-CM | POA: Diagnosis not present

## 2021-05-18 ENCOUNTER — Other Ambulatory Visit: Payer: Self-pay | Admitting: Internal Medicine

## 2021-05-18 DIAGNOSIS — Z1231 Encounter for screening mammogram for malignant neoplasm of breast: Secondary | ICD-10-CM

## 2021-05-26 DIAGNOSIS — I10 Essential (primary) hypertension: Secondary | ICD-10-CM | POA: Diagnosis not present

## 2021-06-21 ENCOUNTER — Ambulatory Visit: Payer: Medicare Other

## 2021-06-24 DIAGNOSIS — I10 Essential (primary) hypertension: Secondary | ICD-10-CM | POA: Diagnosis not present

## 2021-06-26 DIAGNOSIS — I10 Essential (primary) hypertension: Secondary | ICD-10-CM | POA: Diagnosis not present

## 2021-07-11 ENCOUNTER — Ambulatory Visit: Payer: Medicare Other

## 2021-07-12 ENCOUNTER — Other Ambulatory Visit: Payer: Self-pay

## 2021-07-12 ENCOUNTER — Ambulatory Visit
Admission: RE | Admit: 2021-07-12 | Discharge: 2021-07-12 | Disposition: A | Discharge status: Home / Self Care | Payer: Medicare Other | Source: Ambulatory Visit | Attending: Internal Medicine | Admitting: Internal Medicine

## 2021-07-12 ENCOUNTER — Ambulatory Visit: Payer: Medicare Other

## 2021-07-12 DIAGNOSIS — Z1231 Encounter for screening mammogram for malignant neoplasm of breast: Secondary | ICD-10-CM | POA: Diagnosis not present

## 2021-07-14 ENCOUNTER — Other Ambulatory Visit: Payer: Self-pay | Admitting: Internal Medicine

## 2021-07-14 DIAGNOSIS — R928 Other abnormal and inconclusive findings on diagnostic imaging of breast: Secondary | ICD-10-CM

## 2021-07-22 ENCOUNTER — Ambulatory Visit
Admission: RE | Admit: 2021-07-22 | Discharge: 2021-07-22 | Disposition: A | Discharge status: Home / Self Care | Payer: Medicare Other | Source: Ambulatory Visit | Attending: Internal Medicine | Admitting: Internal Medicine

## 2021-07-22 ENCOUNTER — Other Ambulatory Visit: Payer: Self-pay

## 2021-07-22 DIAGNOSIS — R928 Other abnormal and inconclusive findings on diagnostic imaging of breast: Secondary | ICD-10-CM

## 2021-07-22 DIAGNOSIS — R922 Inconclusive mammogram: Secondary | ICD-10-CM | POA: Diagnosis not present

## 2021-07-27 DIAGNOSIS — K219 Gastro-esophageal reflux disease without esophagitis: Secondary | ICD-10-CM | POA: Diagnosis not present

## 2021-07-27 DIAGNOSIS — E7800 Pure hypercholesterolemia, unspecified: Secondary | ICD-10-CM | POA: Diagnosis not present

## 2021-07-27 DIAGNOSIS — I10 Essential (primary) hypertension: Secondary | ICD-10-CM | POA: Diagnosis not present

## 2021-07-29 ENCOUNTER — Other Ambulatory Visit: Payer: Medicare Other

## 2021-07-29 DIAGNOSIS — E1165 Type 2 diabetes mellitus with hyperglycemia: Secondary | ICD-10-CM | POA: Diagnosis not present

## 2021-07-29 DIAGNOSIS — Z299 Encounter for prophylactic measures, unspecified: Secondary | ICD-10-CM | POA: Diagnosis not present

## 2021-07-29 DIAGNOSIS — I10 Essential (primary) hypertension: Secondary | ICD-10-CM | POA: Diagnosis not present

## 2021-08-02 ENCOUNTER — Other Ambulatory Visit: Payer: Medicare Other

## 2021-08-11 DIAGNOSIS — E1165 Type 2 diabetes mellitus with hyperglycemia: Secondary | ICD-10-CM | POA: Diagnosis not present

## 2021-08-11 DIAGNOSIS — R5383 Other fatigue: Secondary | ICD-10-CM | POA: Diagnosis not present

## 2021-08-11 DIAGNOSIS — Z0000 Encounter for general adult medical examination without abnormal findings: Secondary | ICD-10-CM | POA: Diagnosis not present

## 2021-08-11 DIAGNOSIS — I10 Essential (primary) hypertension: Secondary | ICD-10-CM | POA: Diagnosis not present

## 2021-08-11 DIAGNOSIS — Z299 Encounter for prophylactic measures, unspecified: Secondary | ICD-10-CM | POA: Diagnosis not present

## 2021-08-11 DIAGNOSIS — Z7189 Other specified counseling: Secondary | ICD-10-CM | POA: Diagnosis not present

## 2021-08-11 DIAGNOSIS — E7800 Pure hypercholesterolemia, unspecified: Secondary | ICD-10-CM | POA: Diagnosis not present

## 2021-08-12 DIAGNOSIS — Z79899 Other long term (current) drug therapy: Secondary | ICD-10-CM | POA: Diagnosis not present

## 2021-08-12 DIAGNOSIS — R5383 Other fatigue: Secondary | ICD-10-CM | POA: Diagnosis not present

## 2021-08-12 DIAGNOSIS — E7800 Pure hypercholesterolemia, unspecified: Secondary | ICD-10-CM | POA: Diagnosis not present

## 2021-08-26 DIAGNOSIS — I10 Essential (primary) hypertension: Secondary | ICD-10-CM | POA: Diagnosis not present

## 2021-09-22 DIAGNOSIS — I10 Essential (primary) hypertension: Secondary | ICD-10-CM | POA: Diagnosis not present

## 2021-09-22 DIAGNOSIS — Z299 Encounter for prophylactic measures, unspecified: Secondary | ICD-10-CM | POA: Diagnosis not present

## 2021-09-22 DIAGNOSIS — I779 Disorder of arteries and arterioles, unspecified: Secondary | ICD-10-CM | POA: Diagnosis not present

## 2021-09-22 DIAGNOSIS — J069 Acute upper respiratory infection, unspecified: Secondary | ICD-10-CM | POA: Diagnosis not present

## 2021-09-22 DIAGNOSIS — I700 Atherosclerosis of aorta: Secondary | ICD-10-CM | POA: Diagnosis not present

## 2021-09-26 DIAGNOSIS — E7849 Other hyperlipidemia: Secondary | ICD-10-CM | POA: Diagnosis not present

## 2021-09-26 DIAGNOSIS — I10 Essential (primary) hypertension: Secondary | ICD-10-CM | POA: Diagnosis not present

## 2021-10-26 DIAGNOSIS — I10 Essential (primary) hypertension: Secondary | ICD-10-CM | POA: Diagnosis not present

## 2021-11-25 DIAGNOSIS — Z299 Encounter for prophylactic measures, unspecified: Secondary | ICD-10-CM | POA: Diagnosis not present

## 2021-11-25 DIAGNOSIS — I779 Disorder of arteries and arterioles, unspecified: Secondary | ICD-10-CM | POA: Diagnosis not present

## 2021-11-25 DIAGNOSIS — E1165 Type 2 diabetes mellitus with hyperglycemia: Secondary | ICD-10-CM | POA: Diagnosis not present

## 2021-11-25 DIAGNOSIS — I700 Atherosclerosis of aorta: Secondary | ICD-10-CM | POA: Diagnosis not present

## 2021-11-25 DIAGNOSIS — I272 Pulmonary hypertension, unspecified: Secondary | ICD-10-CM | POA: Diagnosis not present

## 2021-11-25 DIAGNOSIS — I10 Essential (primary) hypertension: Secondary | ICD-10-CM | POA: Diagnosis not present

## 2021-12-25 DIAGNOSIS — I10 Essential (primary) hypertension: Secondary | ICD-10-CM | POA: Diagnosis not present

## 2022-01-24 DIAGNOSIS — I10 Essential (primary) hypertension: Secondary | ICD-10-CM | POA: Diagnosis not present

## 2022-02-23 DIAGNOSIS — I10 Essential (primary) hypertension: Secondary | ICD-10-CM | POA: Diagnosis not present

## 2022-03-06 DIAGNOSIS — I10 Essential (primary) hypertension: Secondary | ICD-10-CM | POA: Diagnosis not present

## 2022-03-06 DIAGNOSIS — E1165 Type 2 diabetes mellitus with hyperglycemia: Secondary | ICD-10-CM | POA: Diagnosis not present

## 2022-03-06 DIAGNOSIS — Z299 Encounter for prophylactic measures, unspecified: Secondary | ICD-10-CM | POA: Diagnosis not present

## 2022-03-06 DIAGNOSIS — I700 Atherosclerosis of aorta: Secondary | ICD-10-CM | POA: Diagnosis not present

## 2022-03-26 DIAGNOSIS — I10 Essential (primary) hypertension: Secondary | ICD-10-CM | POA: Diagnosis not present

## 2022-03-29 DIAGNOSIS — Z79899 Other long term (current) drug therapy: Secondary | ICD-10-CM | POA: Diagnosis not present

## 2022-03-29 DIAGNOSIS — I10 Essential (primary) hypertension: Secondary | ICD-10-CM | POA: Diagnosis not present

## 2022-03-29 DIAGNOSIS — R072 Precordial pain: Secondary | ICD-10-CM | POA: Diagnosis not present

## 2022-03-29 DIAGNOSIS — Z8673 Personal history of transient ischemic attack (TIA), and cerebral infarction without residual deficits: Secondary | ICD-10-CM | POA: Diagnosis not present

## 2022-03-29 DIAGNOSIS — R079 Chest pain, unspecified: Secondary | ICD-10-CM | POA: Diagnosis not present

## 2022-03-29 DIAGNOSIS — E785 Hyperlipidemia, unspecified: Secondary | ICD-10-CM | POA: Diagnosis not present

## 2022-03-29 DIAGNOSIS — K219 Gastro-esophageal reflux disease without esophagitis: Secondary | ICD-10-CM | POA: Diagnosis not present

## 2022-03-29 DIAGNOSIS — F32A Depression, unspecified: Secondary | ICD-10-CM | POA: Diagnosis not present

## 2022-03-29 DIAGNOSIS — R059 Cough, unspecified: Secondary | ICD-10-CM | POA: Diagnosis not present

## 2022-03-29 DIAGNOSIS — M1990 Unspecified osteoarthritis, unspecified site: Secondary | ICD-10-CM | POA: Diagnosis not present

## 2022-03-29 DIAGNOSIS — Z853 Personal history of malignant neoplasm of breast: Secondary | ICD-10-CM | POA: Diagnosis not present

## 2022-03-29 DIAGNOSIS — Z7982 Long term (current) use of aspirin: Secondary | ICD-10-CM | POA: Diagnosis not present

## 2022-03-29 DIAGNOSIS — R0789 Other chest pain: Secondary | ICD-10-CM | POA: Diagnosis not present

## 2022-03-29 DIAGNOSIS — I440 Atrioventricular block, first degree: Secondary | ICD-10-CM | POA: Diagnosis not present

## 2022-03-29 DIAGNOSIS — J9811 Atelectasis: Secondary | ICD-10-CM | POA: Diagnosis not present

## 2022-04-03 DIAGNOSIS — E1165 Type 2 diabetes mellitus with hyperglycemia: Secondary | ICD-10-CM | POA: Diagnosis not present

## 2022-04-03 DIAGNOSIS — I779 Disorder of arteries and arterioles, unspecified: Secondary | ICD-10-CM | POA: Diagnosis not present

## 2022-04-03 DIAGNOSIS — Z299 Encounter for prophylactic measures, unspecified: Secondary | ICD-10-CM | POA: Diagnosis not present

## 2022-04-03 DIAGNOSIS — I700 Atherosclerosis of aorta: Secondary | ICD-10-CM | POA: Diagnosis not present

## 2022-04-03 DIAGNOSIS — I10 Essential (primary) hypertension: Secondary | ICD-10-CM | POA: Diagnosis not present

## 2022-04-17 DIAGNOSIS — I10 Essential (primary) hypertension: Secondary | ICD-10-CM | POA: Diagnosis not present

## 2022-04-17 DIAGNOSIS — I2510 Atherosclerotic heart disease of native coronary artery without angina pectoris: Secondary | ICD-10-CM | POA: Diagnosis not present

## 2022-04-17 DIAGNOSIS — R0789 Other chest pain: Secondary | ICD-10-CM | POA: Diagnosis not present

## 2022-04-17 DIAGNOSIS — E785 Hyperlipidemia, unspecified: Secondary | ICD-10-CM | POA: Diagnosis not present

## 2022-04-25 DIAGNOSIS — I10 Essential (primary) hypertension: Secondary | ICD-10-CM | POA: Diagnosis not present

## 2022-05-01 DIAGNOSIS — Z299 Encounter for prophylactic measures, unspecified: Secondary | ICD-10-CM | POA: Diagnosis not present

## 2022-05-01 DIAGNOSIS — K589 Irritable bowel syndrome without diarrhea: Secondary | ICD-10-CM | POA: Diagnosis not present

## 2022-05-08 DIAGNOSIS — Z789 Other specified health status: Secondary | ICD-10-CM | POA: Diagnosis not present

## 2022-05-08 DIAGNOSIS — Z299 Encounter for prophylactic measures, unspecified: Secondary | ICD-10-CM | POA: Diagnosis not present

## 2022-05-08 DIAGNOSIS — I10 Essential (primary) hypertension: Secondary | ICD-10-CM | POA: Diagnosis not present

## 2022-05-25 DIAGNOSIS — I10 Essential (primary) hypertension: Secondary | ICD-10-CM | POA: Diagnosis not present

## 2022-06-01 DIAGNOSIS — D492 Neoplasm of unspecified behavior of bone, soft tissue, and skin: Secondary | ICD-10-CM | POA: Diagnosis not present

## 2022-06-01 DIAGNOSIS — I10 Essential (primary) hypertension: Secondary | ICD-10-CM | POA: Diagnosis not present

## 2022-06-01 DIAGNOSIS — Z299 Encounter for prophylactic measures, unspecified: Secondary | ICD-10-CM | POA: Diagnosis not present

## 2022-06-15 DIAGNOSIS — E1165 Type 2 diabetes mellitus with hyperglycemia: Secondary | ICD-10-CM | POA: Diagnosis not present

## 2022-06-15 DIAGNOSIS — Z299 Encounter for prophylactic measures, unspecified: Secondary | ICD-10-CM | POA: Diagnosis not present

## 2022-06-15 DIAGNOSIS — E1159 Type 2 diabetes mellitus with other circulatory complications: Secondary | ICD-10-CM | POA: Diagnosis not present

## 2022-06-15 DIAGNOSIS — I152 Hypertension secondary to endocrine disorders: Secondary | ICD-10-CM | POA: Diagnosis not present

## 2022-06-15 DIAGNOSIS — I10 Essential (primary) hypertension: Secondary | ICD-10-CM | POA: Diagnosis not present

## 2022-06-22 ENCOUNTER — Other Ambulatory Visit: Payer: Self-pay | Admitting: Internal Medicine

## 2022-06-22 DIAGNOSIS — Z1231 Encounter for screening mammogram for malignant neoplasm of breast: Secondary | ICD-10-CM

## 2022-06-26 DIAGNOSIS — I10 Essential (primary) hypertension: Secondary | ICD-10-CM | POA: Diagnosis not present

## 2022-07-10 DIAGNOSIS — D0439 Carcinoma in situ of skin of other parts of face: Secondary | ICD-10-CM | POA: Diagnosis not present

## 2022-07-24 ENCOUNTER — Ambulatory Visit
Admission: RE | Admit: 2022-07-24 | Discharge: 2022-07-24 | Disposition: A | Discharge status: Home / Self Care | Payer: Medicare Other | Source: Ambulatory Visit | Attending: Internal Medicine | Admitting: Internal Medicine

## 2022-07-24 ENCOUNTER — Ambulatory Visit: Payer: Medicare Other

## 2022-07-24 DIAGNOSIS — Z1231 Encounter for screening mammogram for malignant neoplasm of breast: Secondary | ICD-10-CM | POA: Diagnosis not present

## 2022-07-26 DIAGNOSIS — I10 Essential (primary) hypertension: Secondary | ICD-10-CM | POA: Diagnosis not present

## 2022-08-10 DIAGNOSIS — L570 Actinic keratosis: Secondary | ICD-10-CM | POA: Diagnosis not present

## 2022-08-10 DIAGNOSIS — Z08 Encounter for follow-up examination after completed treatment for malignant neoplasm: Secondary | ICD-10-CM | POA: Diagnosis not present

## 2022-08-10 DIAGNOSIS — Z85828 Personal history of other malignant neoplasm of skin: Secondary | ICD-10-CM | POA: Diagnosis not present

## 2022-08-10 DIAGNOSIS — X32XXXD Exposure to sunlight, subsequent encounter: Secondary | ICD-10-CM | POA: Diagnosis not present

## 2022-08-25 DIAGNOSIS — I10 Essential (primary) hypertension: Secondary | ICD-10-CM | POA: Diagnosis not present

## 2022-08-28 DIAGNOSIS — I779 Disorder of arteries and arterioles, unspecified: Secondary | ICD-10-CM | POA: Diagnosis not present

## 2022-08-28 DIAGNOSIS — I10 Essential (primary) hypertension: Secondary | ICD-10-CM | POA: Diagnosis not present

## 2022-08-28 DIAGNOSIS — Z789 Other specified health status: Secondary | ICD-10-CM | POA: Diagnosis not present

## 2022-08-28 DIAGNOSIS — Z23 Encounter for immunization: Secondary | ICD-10-CM | POA: Diagnosis not present

## 2022-08-28 DIAGNOSIS — Z299 Encounter for prophylactic measures, unspecified: Secondary | ICD-10-CM | POA: Diagnosis not present

## 2022-08-28 DIAGNOSIS — I700 Atherosclerosis of aorta: Secondary | ICD-10-CM | POA: Diagnosis not present

## 2022-08-28 DIAGNOSIS — Z7189 Other specified counseling: Secondary | ICD-10-CM | POA: Diagnosis not present

## 2022-08-28 DIAGNOSIS — Z0000 Encounter for general adult medical examination without abnormal findings: Secondary | ICD-10-CM | POA: Diagnosis not present

## 2022-09-25 DIAGNOSIS — I10 Essential (primary) hypertension: Secondary | ICD-10-CM | POA: Diagnosis not present

## 2022-10-04 DIAGNOSIS — Z0000 Encounter for general adult medical examination without abnormal findings: Secondary | ICD-10-CM | POA: Diagnosis not present

## 2022-10-04 DIAGNOSIS — R5383 Other fatigue: Secondary | ICD-10-CM | POA: Diagnosis not present

## 2022-10-04 DIAGNOSIS — Z789 Other specified health status: Secondary | ICD-10-CM | POA: Diagnosis not present

## 2022-10-04 DIAGNOSIS — I10 Essential (primary) hypertension: Secondary | ICD-10-CM | POA: Diagnosis not present

## 2022-10-04 DIAGNOSIS — Z79899 Other long term (current) drug therapy: Secondary | ICD-10-CM | POA: Diagnosis not present

## 2022-10-04 DIAGNOSIS — E7800 Pure hypercholesterolemia, unspecified: Secondary | ICD-10-CM | POA: Diagnosis not present

## 2022-10-04 DIAGNOSIS — Z299 Encounter for prophylactic measures, unspecified: Secondary | ICD-10-CM | POA: Diagnosis not present

## 2022-10-04 DIAGNOSIS — E119 Type 2 diabetes mellitus without complications: Secondary | ICD-10-CM | POA: Diagnosis not present

## 2022-10-12 DIAGNOSIS — L821 Other seborrheic keratosis: Secondary | ICD-10-CM | POA: Diagnosis not present

## 2022-10-12 DIAGNOSIS — L308 Other specified dermatitis: Secondary | ICD-10-CM | POA: Diagnosis not present

## 2022-10-25 DIAGNOSIS — I10 Essential (primary) hypertension: Secondary | ICD-10-CM | POA: Diagnosis not present

## 2022-11-24 DIAGNOSIS — I10 Essential (primary) hypertension: Secondary | ICD-10-CM | POA: Diagnosis not present

## 2022-12-22 DIAGNOSIS — F32A Depression, unspecified: Secondary | ICD-10-CM | POA: Diagnosis not present

## 2022-12-22 DIAGNOSIS — Z882 Allergy status to sulfonamides status: Secondary | ICD-10-CM | POA: Diagnosis not present

## 2022-12-22 DIAGNOSIS — Z888 Allergy status to other drugs, medicaments and biological substances status: Secondary | ICD-10-CM | POA: Diagnosis not present

## 2022-12-22 DIAGNOSIS — S61216A Laceration without foreign body of right little finger without damage to nail, initial encounter: Secondary | ICD-10-CM | POA: Diagnosis not present

## 2022-12-22 DIAGNOSIS — Z886 Allergy status to analgesic agent status: Secondary | ICD-10-CM | POA: Diagnosis not present

## 2022-12-22 DIAGNOSIS — S61326A Laceration with foreign body of right little finger with damage to nail, initial encounter: Secondary | ICD-10-CM | POA: Diagnosis not present

## 2022-12-22 DIAGNOSIS — W268XXA Contact with other sharp object(s), not elsewhere classified, initial encounter: Secondary | ICD-10-CM | POA: Diagnosis not present

## 2022-12-22 DIAGNOSIS — Z7982 Long term (current) use of aspirin: Secondary | ICD-10-CM | POA: Diagnosis not present

## 2022-12-22 DIAGNOSIS — I10 Essential (primary) hypertension: Secondary | ICD-10-CM | POA: Diagnosis not present

## 2022-12-22 DIAGNOSIS — Z880 Allergy status to penicillin: Secondary | ICD-10-CM | POA: Diagnosis not present

## 2022-12-22 DIAGNOSIS — Z885 Allergy status to narcotic agent status: Secondary | ICD-10-CM | POA: Diagnosis not present

## 2022-12-25 DIAGNOSIS — I10 Essential (primary) hypertension: Secondary | ICD-10-CM | POA: Diagnosis not present

## 2023-01-17 DIAGNOSIS — I272 Pulmonary hypertension, unspecified: Secondary | ICD-10-CM | POA: Diagnosis not present

## 2023-01-17 DIAGNOSIS — D692 Other nonthrombocytopenic purpura: Secondary | ICD-10-CM | POA: Diagnosis not present

## 2023-01-17 DIAGNOSIS — E1165 Type 2 diabetes mellitus with hyperglycemia: Secondary | ICD-10-CM | POA: Diagnosis not present

## 2023-01-17 DIAGNOSIS — Z299 Encounter for prophylactic measures, unspecified: Secondary | ICD-10-CM | POA: Diagnosis not present

## 2023-01-17 DIAGNOSIS — I10 Essential (primary) hypertension: Secondary | ICD-10-CM | POA: Diagnosis not present

## 2023-01-24 DIAGNOSIS — I10 Essential (primary) hypertension: Secondary | ICD-10-CM | POA: Diagnosis not present

## 2023-02-24 DIAGNOSIS — I10 Essential (primary) hypertension: Secondary | ICD-10-CM | POA: Diagnosis not present

## 2023-03-05 DIAGNOSIS — X32XXXD Exposure to sunlight, subsequent encounter: Secondary | ICD-10-CM | POA: Diagnosis not present

## 2023-03-05 DIAGNOSIS — L570 Actinic keratosis: Secondary | ICD-10-CM | POA: Diagnosis not present

## 2023-03-27 DIAGNOSIS — I10 Essential (primary) hypertension: Secondary | ICD-10-CM | POA: Diagnosis not present

## 2023-04-04 DIAGNOSIS — Z7189 Other specified counseling: Secondary | ICD-10-CM | POA: Diagnosis not present

## 2023-04-04 DIAGNOSIS — Z299 Encounter for prophylactic measures, unspecified: Secondary | ICD-10-CM | POA: Diagnosis not present

## 2023-04-04 DIAGNOSIS — I779 Disorder of arteries and arterioles, unspecified: Secondary | ICD-10-CM | POA: Diagnosis not present

## 2023-04-04 DIAGNOSIS — Z0000 Encounter for general adult medical examination without abnormal findings: Secondary | ICD-10-CM | POA: Diagnosis not present

## 2023-04-04 DIAGNOSIS — I771 Stricture of artery: Secondary | ICD-10-CM | POA: Diagnosis not present

## 2023-04-04 DIAGNOSIS — I10 Essential (primary) hypertension: Secondary | ICD-10-CM | POA: Diagnosis not present

## 2023-04-10 DIAGNOSIS — Z961 Presence of intraocular lens: Secondary | ICD-10-CM | POA: Diagnosis not present

## 2023-04-10 DIAGNOSIS — E119 Type 2 diabetes mellitus without complications: Secondary | ICD-10-CM | POA: Diagnosis not present

## 2023-04-16 DIAGNOSIS — I10 Essential (primary) hypertension: Secondary | ICD-10-CM | POA: Diagnosis not present

## 2023-04-16 DIAGNOSIS — I2510 Atherosclerotic heart disease of native coronary artery without angina pectoris: Secondary | ICD-10-CM | POA: Diagnosis not present

## 2023-04-16 DIAGNOSIS — E785 Hyperlipidemia, unspecified: Secondary | ICD-10-CM | POA: Diagnosis not present

## 2023-04-27 DIAGNOSIS — I10 Essential (primary) hypertension: Secondary | ICD-10-CM | POA: Diagnosis not present

## 2023-05-18 DIAGNOSIS — I272 Pulmonary hypertension, unspecified: Secondary | ICD-10-CM | POA: Diagnosis not present

## 2023-05-18 DIAGNOSIS — Z299 Encounter for prophylactic measures, unspecified: Secondary | ICD-10-CM | POA: Diagnosis not present

## 2023-05-18 DIAGNOSIS — E1165 Type 2 diabetes mellitus with hyperglycemia: Secondary | ICD-10-CM | POA: Diagnosis not present

## 2023-05-18 DIAGNOSIS — I779 Disorder of arteries and arterioles, unspecified: Secondary | ICD-10-CM | POA: Diagnosis not present

## 2023-05-18 DIAGNOSIS — I700 Atherosclerosis of aorta: Secondary | ICD-10-CM | POA: Diagnosis not present

## 2023-05-18 DIAGNOSIS — I10 Essential (primary) hypertension: Secondary | ICD-10-CM | POA: Diagnosis not present

## 2023-05-27 DIAGNOSIS — I10 Essential (primary) hypertension: Secondary | ICD-10-CM | POA: Diagnosis not present

## 2023-06-14 ENCOUNTER — Other Ambulatory Visit: Payer: Self-pay | Admitting: Internal Medicine

## 2023-06-14 DIAGNOSIS — Z1231 Encounter for screening mammogram for malignant neoplasm of breast: Secondary | ICD-10-CM

## 2023-06-21 DIAGNOSIS — R809 Proteinuria, unspecified: Secondary | ICD-10-CM | POA: Diagnosis not present

## 2023-06-21 DIAGNOSIS — L089 Local infection of the skin and subcutaneous tissue, unspecified: Secondary | ICD-10-CM | POA: Diagnosis not present

## 2023-06-21 DIAGNOSIS — L97919 Non-pressure chronic ulcer of unspecified part of right lower leg with unspecified severity: Secondary | ICD-10-CM | POA: Diagnosis not present

## 2023-06-21 DIAGNOSIS — E1129 Type 2 diabetes mellitus with other diabetic kidney complication: Secondary | ICD-10-CM | POA: Diagnosis not present

## 2023-06-21 DIAGNOSIS — I779 Disorder of arteries and arterioles, unspecified: Secondary | ICD-10-CM | POA: Diagnosis not present

## 2023-06-21 DIAGNOSIS — Z299 Encounter for prophylactic measures, unspecified: Secondary | ICD-10-CM | POA: Diagnosis not present

## 2023-06-21 DIAGNOSIS — I10 Essential (primary) hypertension: Secondary | ICD-10-CM | POA: Diagnosis not present

## 2023-06-27 DIAGNOSIS — I10 Essential (primary) hypertension: Secondary | ICD-10-CM | POA: Diagnosis not present

## 2023-08-02 ENCOUNTER — Ambulatory Visit
Admission: RE | Admit: 2023-08-02 | Discharge: 2023-08-02 | Disposition: A | Discharge status: Home / Self Care | Payer: Medicare Other | Source: Ambulatory Visit | Attending: Internal Medicine | Admitting: Internal Medicine

## 2023-08-02 DIAGNOSIS — Z1231 Encounter for screening mammogram for malignant neoplasm of breast: Secondary | ICD-10-CM

## 2023-08-06 ENCOUNTER — Other Ambulatory Visit: Payer: Self-pay | Admitting: Internal Medicine

## 2023-08-06 DIAGNOSIS — R928 Other abnormal and inconclusive findings on diagnostic imaging of breast: Secondary | ICD-10-CM

## 2023-08-13 DIAGNOSIS — Z299 Encounter for prophylactic measures, unspecified: Secondary | ICD-10-CM | POA: Diagnosis not present

## 2023-08-13 DIAGNOSIS — R928 Other abnormal and inconclusive findings on diagnostic imaging of breast: Secondary | ICD-10-CM | POA: Diagnosis not present

## 2023-08-13 DIAGNOSIS — I10 Essential (primary) hypertension: Secondary | ICD-10-CM | POA: Diagnosis not present

## 2023-08-21 ENCOUNTER — Ambulatory Visit
Admission: RE | Admit: 2023-08-21 | Discharge: 2023-08-21 | Disposition: A | Discharge status: Home / Self Care | Payer: Medicare Other | Source: Ambulatory Visit | Attending: Internal Medicine | Admitting: Internal Medicine

## 2023-08-21 ENCOUNTER — Ambulatory Visit: Payer: Medicare Other

## 2023-08-21 DIAGNOSIS — R928 Other abnormal and inconclusive findings on diagnostic imaging of breast: Secondary | ICD-10-CM | POA: Diagnosis not present

## 2023-08-27 DIAGNOSIS — I10 Essential (primary) hypertension: Secondary | ICD-10-CM | POA: Diagnosis not present

## 2023-09-26 DIAGNOSIS — E1159 Type 2 diabetes mellitus with other circulatory complications: Secondary | ICD-10-CM | POA: Diagnosis not present

## 2023-09-26 DIAGNOSIS — Z299 Encounter for prophylactic measures, unspecified: Secondary | ICD-10-CM | POA: Diagnosis not present

## 2023-09-26 DIAGNOSIS — I10 Essential (primary) hypertension: Secondary | ICD-10-CM | POA: Diagnosis not present

## 2023-09-26 DIAGNOSIS — I152 Hypertension secondary to endocrine disorders: Secondary | ICD-10-CM | POA: Diagnosis not present

## 2023-10-17 DIAGNOSIS — Z79899 Other long term (current) drug therapy: Secondary | ICD-10-CM | POA: Diagnosis not present

## 2023-10-17 DIAGNOSIS — Z0000 Encounter for general adult medical examination without abnormal findings: Secondary | ICD-10-CM | POA: Diagnosis not present

## 2023-10-17 DIAGNOSIS — Z299 Encounter for prophylactic measures, unspecified: Secondary | ICD-10-CM | POA: Diagnosis not present

## 2023-10-17 DIAGNOSIS — I10 Essential (primary) hypertension: Secondary | ICD-10-CM | POA: Diagnosis not present

## 2023-10-17 DIAGNOSIS — E7800 Pure hypercholesterolemia, unspecified: Secondary | ICD-10-CM | POA: Diagnosis not present

## 2023-10-17 DIAGNOSIS — R5383 Other fatigue: Secondary | ICD-10-CM | POA: Diagnosis not present

## 2023-10-26 DIAGNOSIS — I10 Essential (primary) hypertension: Secondary | ICD-10-CM | POA: Diagnosis not present

## 2023-11-26 DIAGNOSIS — I10 Essential (primary) hypertension: Secondary | ICD-10-CM | POA: Diagnosis not present

## 2023-12-26 DIAGNOSIS — I10 Essential (primary) hypertension: Secondary | ICD-10-CM | POA: Diagnosis not present

## 2024-01-03 DIAGNOSIS — X32XXXD Exposure to sunlight, subsequent encounter: Secondary | ICD-10-CM | POA: Diagnosis not present

## 2024-01-03 DIAGNOSIS — L570 Actinic keratosis: Secondary | ICD-10-CM | POA: Diagnosis not present

## 2024-01-03 DIAGNOSIS — D485 Neoplasm of uncertain behavior of skin: Secondary | ICD-10-CM | POA: Diagnosis not present

## 2024-01-03 DIAGNOSIS — L814 Other melanin hyperpigmentation: Secondary | ICD-10-CM | POA: Diagnosis not present

## 2024-01-11 DIAGNOSIS — D692 Other nonthrombocytopenic purpura: Secondary | ICD-10-CM | POA: Diagnosis not present

## 2024-01-11 DIAGNOSIS — Z299 Encounter for prophylactic measures, unspecified: Secondary | ICD-10-CM | POA: Diagnosis not present

## 2024-01-11 DIAGNOSIS — I10 Essential (primary) hypertension: Secondary | ICD-10-CM | POA: Diagnosis not present

## 2024-01-11 DIAGNOSIS — M25511 Pain in right shoulder: Secondary | ICD-10-CM | POA: Diagnosis not present

## 2024-01-11 DIAGNOSIS — E1165 Type 2 diabetes mellitus with hyperglycemia: Secondary | ICD-10-CM | POA: Diagnosis not present

## 2024-01-11 DIAGNOSIS — E119 Type 2 diabetes mellitus without complications: Secondary | ICD-10-CM | POA: Diagnosis not present

## 2024-01-25 DIAGNOSIS — I10 Essential (primary) hypertension: Secondary | ICD-10-CM | POA: Diagnosis not present

## 2024-02-24 DIAGNOSIS — I10 Essential (primary) hypertension: Secondary | ICD-10-CM | POA: Diagnosis not present

## 2024-03-12 DIAGNOSIS — I10 Essential (primary) hypertension: Secondary | ICD-10-CM | POA: Diagnosis not present

## 2024-03-12 DIAGNOSIS — I779 Disorder of arteries and arterioles, unspecified: Secondary | ICD-10-CM | POA: Diagnosis not present

## 2024-03-12 DIAGNOSIS — I700 Atherosclerosis of aorta: Secondary | ICD-10-CM | POA: Diagnosis not present

## 2024-03-12 DIAGNOSIS — Z299 Encounter for prophylactic measures, unspecified: Secondary | ICD-10-CM | POA: Diagnosis not present

## 2024-03-12 DIAGNOSIS — Z0000 Encounter for general adult medical examination without abnormal findings: Secondary | ICD-10-CM | POA: Diagnosis not present

## 2024-03-12 DIAGNOSIS — Z7189 Other specified counseling: Secondary | ICD-10-CM | POA: Diagnosis not present

## 2024-03-12 DIAGNOSIS — Z713 Dietary counseling and surveillance: Secondary | ICD-10-CM | POA: Diagnosis not present

## 2024-03-26 DIAGNOSIS — I10 Essential (primary) hypertension: Secondary | ICD-10-CM | POA: Diagnosis not present

## 2024-04-17 DIAGNOSIS — I10 Essential (primary) hypertension: Secondary | ICD-10-CM | POA: Diagnosis not present

## 2024-04-17 DIAGNOSIS — I2510 Atherosclerotic heart disease of native coronary artery without angina pectoris: Secondary | ICD-10-CM | POA: Diagnosis not present

## 2024-04-17 DIAGNOSIS — E785 Hyperlipidemia, unspecified: Secondary | ICD-10-CM | POA: Diagnosis not present

## 2024-04-23 DIAGNOSIS — I10 Essential (primary) hypertension: Secondary | ICD-10-CM | POA: Diagnosis not present

## 2024-04-23 DIAGNOSIS — R0602 Shortness of breath: Secondary | ICD-10-CM | POA: Diagnosis not present

## 2024-04-23 DIAGNOSIS — E1165 Type 2 diabetes mellitus with hyperglycemia: Secondary | ICD-10-CM | POA: Diagnosis not present

## 2024-04-23 DIAGNOSIS — Z299 Encounter for prophylactic measures, unspecified: Secondary | ICD-10-CM | POA: Diagnosis not present

## 2024-04-26 DIAGNOSIS — I10 Essential (primary) hypertension: Secondary | ICD-10-CM | POA: Diagnosis not present

## 2024-05-26 DIAGNOSIS — I10 Essential (primary) hypertension: Secondary | ICD-10-CM | POA: Diagnosis not present

## 2024-06-26 DIAGNOSIS — I10 Essential (primary) hypertension: Secondary | ICD-10-CM | POA: Diagnosis not present

## 2024-07-16 DIAGNOSIS — Z299 Encounter for prophylactic measures, unspecified: Secondary | ICD-10-CM | POA: Diagnosis not present

## 2024-07-16 DIAGNOSIS — R52 Pain, unspecified: Secondary | ICD-10-CM | POA: Diagnosis not present

## 2024-07-16 DIAGNOSIS — M85872 Other specified disorders of bone density and structure, left ankle and foot: Secondary | ICD-10-CM | POA: Diagnosis not present

## 2024-07-16 DIAGNOSIS — R809 Proteinuria, unspecified: Secondary | ICD-10-CM | POA: Diagnosis not present

## 2024-07-16 DIAGNOSIS — I10 Essential (primary) hypertension: Secondary | ICD-10-CM | POA: Diagnosis not present

## 2024-07-16 DIAGNOSIS — M79672 Pain in left foot: Secondary | ICD-10-CM | POA: Diagnosis not present

## 2024-07-16 DIAGNOSIS — E1129 Type 2 diabetes mellitus with other diabetic kidney complication: Secondary | ICD-10-CM | POA: Diagnosis not present

## 2024-07-26 DIAGNOSIS — I10 Essential (primary) hypertension: Secondary | ICD-10-CM | POA: Diagnosis not present

## 2024-07-30 DIAGNOSIS — R600 Localized edema: Secondary | ICD-10-CM | POA: Diagnosis not present

## 2024-07-30 DIAGNOSIS — Z299 Encounter for prophylactic measures, unspecified: Secondary | ICD-10-CM | POA: Diagnosis not present

## 2024-07-30 DIAGNOSIS — I10 Essential (primary) hypertension: Secondary | ICD-10-CM | POA: Diagnosis not present

## 2024-07-30 DIAGNOSIS — E119 Type 2 diabetes mellitus without complications: Secondary | ICD-10-CM | POA: Diagnosis not present

## 2024-08-20 ENCOUNTER — Other Ambulatory Visit: Payer: Self-pay | Admitting: Internal Medicine

## 2024-08-20 DIAGNOSIS — Z0000 Encounter for general adult medical examination without abnormal findings: Secondary | ICD-10-CM

## 2024-08-26 DIAGNOSIS — I10 Essential (primary) hypertension: Secondary | ICD-10-CM | POA: Diagnosis not present

## 2024-08-27 ENCOUNTER — Ambulatory Visit
Admission: RE | Admit: 2024-08-27 | Discharge: 2024-08-27 | Disposition: A | Discharge status: Home / Self Care | Source: Ambulatory Visit | Attending: Internal Medicine | Admitting: Internal Medicine

## 2024-08-27 DIAGNOSIS — Z0000 Encounter for general adult medical examination without abnormal findings: Secondary | ICD-10-CM

## 2024-08-27 DIAGNOSIS — Z1231 Encounter for screening mammogram for malignant neoplasm of breast: Secondary | ICD-10-CM | POA: Diagnosis not present

## 2024-09-01 DIAGNOSIS — R0981 Nasal congestion: Secondary | ICD-10-CM | POA: Diagnosis not present

## 2024-09-01 DIAGNOSIS — Z299 Encounter for prophylactic measures, unspecified: Secondary | ICD-10-CM | POA: Diagnosis not present

## 2024-09-01 DIAGNOSIS — R070 Pain in throat: Secondary | ICD-10-CM | POA: Diagnosis not present

## 2024-09-01 DIAGNOSIS — J020 Streptococcal pharyngitis: Secondary | ICD-10-CM | POA: Diagnosis not present

## 2024-09-02 ENCOUNTER — Other Ambulatory Visit: Payer: Self-pay | Admitting: Internal Medicine

## 2024-09-02 DIAGNOSIS — R928 Other abnormal and inconclusive findings on diagnostic imaging of breast: Secondary | ICD-10-CM

## 2024-09-16 ENCOUNTER — Encounter

## 2024-09-16 ENCOUNTER — Other Ambulatory Visit

## 2024-09-22 ENCOUNTER — Encounter

## 2024-09-22 ENCOUNTER — Other Ambulatory Visit

## 2024-10-01 ENCOUNTER — Ambulatory Visit
Admission: RE | Admit: 2024-10-01 | Discharge: 2024-10-01 | Disposition: A | Discharge status: Home / Self Care | Source: Ambulatory Visit | Attending: Internal Medicine | Admitting: Internal Medicine

## 2024-10-01 ENCOUNTER — Ambulatory Visit

## 2024-10-01 DIAGNOSIS — R928 Other abnormal and inconclusive findings on diagnostic imaging of breast: Secondary | ICD-10-CM
